# Patient Record
Sex: Male | Born: 1960 | Race: White | Hispanic: Yes | Marital: Married | Smoking: Never smoker
Health system: Southern US, Community
[De-identification: ages and names within clinical notes are randomized; demographics above are authoritative.]

## PROBLEM LIST (undated history)

## (undated) DIAGNOSIS — R519 Headache, unspecified: Secondary | ICD-10-CM

## (undated) DIAGNOSIS — T8859XA Other complications of anesthesia, initial encounter: Secondary | ICD-10-CM

## (undated) DIAGNOSIS — E119 Type 2 diabetes mellitus without complications: Secondary | ICD-10-CM

## (undated) DIAGNOSIS — I10 Essential (primary) hypertension: Secondary | ICD-10-CM

## (undated) DIAGNOSIS — IMO0001 Reserved for inherently not codable concepts without codable children: Secondary | ICD-10-CM

## (undated) DIAGNOSIS — E785 Hyperlipidemia, unspecified: Secondary | ICD-10-CM

## (undated) DIAGNOSIS — E11319 Type 2 diabetes mellitus with unspecified diabetic retinopathy without macular edema: Secondary | ICD-10-CM

## (undated) DIAGNOSIS — D649 Anemia, unspecified: Secondary | ICD-10-CM

## (undated) DIAGNOSIS — K08109 Complete loss of teeth, unspecified cause, unspecified class: Secondary | ICD-10-CM

## (undated) DIAGNOSIS — R51 Headache: Secondary | ICD-10-CM

## (undated) DIAGNOSIS — Z973 Presence of spectacles and contact lenses: Secondary | ICD-10-CM

## (undated) DIAGNOSIS — T4145XA Adverse effect of unspecified anesthetic, initial encounter: Secondary | ICD-10-CM

## (undated) DIAGNOSIS — N186 End stage renal disease: Secondary | ICD-10-CM

## (undated) DIAGNOSIS — Z972 Presence of dental prosthetic device (complete) (partial): Secondary | ICD-10-CM

## (undated) DIAGNOSIS — H5440 Blindness, one eye, unspecified eye: Secondary | ICD-10-CM

## (undated) HISTORY — DX: Essential (primary) hypertension: I10

## (undated) HISTORY — DX: Anemia, unspecified: D64.9

## (undated) HISTORY — PX: OTHER SURGICAL HISTORY: SHX169

## (undated) HISTORY — PX: EYE SURGERY: SHX253

## (undated) HISTORY — DX: Hyperlipidemia, unspecified: E78.5

## (undated) HISTORY — DX: Type 2 diabetes mellitus without complications: E11.9

## (undated) HISTORY — PX: COLONOSCOPY: SHX174

---

## 2009-12-29 ENCOUNTER — Inpatient Hospital Stay (HOSPITAL_COMMUNITY): Admission: EM | Admit: 2009-12-29 | Discharge: 2009-12-31 | Payer: Self-pay | Admitting: Emergency Medicine

## 2010-10-04 LAB — BASIC METABOLIC PANEL
BUN: 14 mg/dL (ref 6–23)
Calcium: 8.7 mg/dL (ref 8.4–10.5)
Creatinine, Ser: 1.02 mg/dL (ref 0.4–1.5)
Potassium: 4.4 mEq/L (ref 3.5–5.1)
Sodium: 142 mEq/L (ref 135–145)

## 2010-10-04 LAB — CBC
HCT: 35.7 % — ABNORMAL LOW (ref 39.0–52.0)
Hemoglobin: 12.1 g/dL — ABNORMAL LOW (ref 13.0–17.0)
MCV: 88 fL (ref 78.0–100.0)
Platelets: 296 10*3/uL (ref 150–400)
WBC: 6.1 10*3/uL (ref 4.0–10.5)

## 2010-10-04 LAB — GLUCOSE, CAPILLARY

## 2010-10-05 LAB — POCT I-STAT, CHEM 8
BUN: 23 mg/dL (ref 6–23)
Calcium, Ion: 1.09 mmol/L — ABNORMAL LOW (ref 1.12–1.32)
Glucose, Bld: 105 mg/dL — ABNORMAL HIGH (ref 70–99)
HCT: 34 % — ABNORMAL LOW (ref 39.0–52.0)
Potassium: 4.1 mEq/L (ref 3.5–5.1)
Sodium: 139 mEq/L (ref 135–145)
TCO2: 25 mmol/L (ref 0–100)

## 2010-10-05 LAB — DIFFERENTIAL
Basophils Absolute: 0 10*3/uL (ref 0.0–0.1)
Basophils Relative: 0 % (ref 0–1)
Eosinophils Absolute: 0.2 10*3/uL (ref 0.0–0.7)
Eosinophils Relative: 2 % (ref 0–5)
Lymphocytes Relative: 19 % (ref 12–46)
Lymphocytes Relative: 20 % (ref 12–46)
Lymphs Abs: 1.5 10*3/uL (ref 0.7–4.0)
Monocytes Relative: 6 % (ref 3–12)
Neutro Abs: 5.4 10*3/uL (ref 1.7–7.7)
Neutrophils Relative %: 72 % (ref 43–77)
Neutrophils Relative %: 74 % (ref 43–77)

## 2010-10-05 LAB — CBC
HCT: 34.7 % — ABNORMAL LOW (ref 39.0–52.0)
HCT: 34.8 % — ABNORMAL LOW (ref 39.0–52.0)
Hemoglobin: 12 g/dL — ABNORMAL LOW (ref 13.0–17.0)
MCHC: 34.5 g/dL (ref 30.0–36.0)
MCV: 87.5 fL (ref 78.0–100.0)
MCV: 88.6 fL (ref 78.0–100.0)
Platelets: 281 10*3/uL (ref 150–400)
RBC: 3.92 MIL/uL — ABNORMAL LOW (ref 4.22–5.81)
RDW: 13.3 % (ref 11.5–15.5)
WBC: 7.6 10*3/uL (ref 4.0–10.5)

## 2010-10-05 LAB — GLUCOSE, CAPILLARY
Glucose-Capillary: 120 mg/dL — ABNORMAL HIGH (ref 70–99)
Glucose-Capillary: 120 mg/dL — ABNORMAL HIGH (ref 70–99)
Glucose-Capillary: 143 mg/dL — ABNORMAL HIGH (ref 70–99)
Glucose-Capillary: 156 mg/dL — ABNORMAL HIGH (ref 70–99)

## 2010-10-05 LAB — LIPID PANEL
Cholesterol: 174 mg/dL (ref 0–200)
HDL: 42 mg/dL (ref >39)
LDL Cholesterol: 100 mg/dL — ABNORMAL HIGH (ref 0–99)
Triglycerides: 159 mg/dL — ABNORMAL HIGH (ref <150)
VLDL: 32 mg/dL (ref 0–40)

## 2010-10-05 LAB — PROTIME-INR: INR: 1.02 (ref 0.00–1.49)

## 2010-10-05 LAB — HEMOGLOBIN A1C: Hgb A1c MFr Bld: 8 % — ABNORMAL HIGH (ref <5.7)

## 2012-11-20 ENCOUNTER — Other Ambulatory Visit (INDEPENDENT_AMBULATORY_CARE_PROVIDER_SITE_OTHER): Payer: 59

## 2012-11-20 DIAGNOSIS — I10 Essential (primary) hypertension: Secondary | ICD-10-CM

## 2012-11-20 DIAGNOSIS — E785 Hyperlipidemia, unspecified: Secondary | ICD-10-CM

## 2012-11-20 DIAGNOSIS — E119 Type 2 diabetes mellitus without complications: Secondary | ICD-10-CM

## 2012-11-20 LAB — BASIC METABOLIC PANEL WITH GFR
BUN: 17 mg/dL (ref 6–23)
CO2: 24 mEq/L (ref 19–32)
Calcium: 8.1 mg/dL — ABNORMAL LOW (ref 8.4–10.5)
Chloride: 108 mEq/L (ref 96–112)
Creat: 1.39 mg/dL — ABNORMAL HIGH (ref 0.50–1.35)
GFR, Est African American: 67 mL/min
GFR, Est Non African American: 58 mL/min — ABNORMAL LOW
Glucose, Bld: 83 mg/dL (ref 70–99)
Potassium: 4.2 mEq/L (ref 3.5–5.3)
Sodium: 140 mEq/L (ref 135–145)

## 2012-11-20 LAB — HEPATIC FUNCTION PANEL
ALT: 28 U/L (ref 0–53)
AST: 26 U/L (ref 0–37)
Albumin: 3.2 g/dL — ABNORMAL LOW (ref 3.5–5.2)
Alkaline Phosphatase: 55 U/L (ref 39–117)
Bilirubin, Direct: 0.1 mg/dL (ref 0.0–0.3)
Indirect Bilirubin: 0.3 mg/dL (ref 0.0–0.9)
Total Bilirubin: 0.4 mg/dL (ref 0.3–1.2)
Total Protein: 5.7 g/dL — ABNORMAL LOW (ref 6.0–8.3)

## 2012-11-20 LAB — MICROALBUMIN, URINE: Microalb, Ur: 77.59 mg/dL — ABNORMAL HIGH (ref 0.00–1.89)

## 2012-11-20 LAB — POCT GLYCOSYLATED HEMOGLOBIN (HGB A1C): Hemoglobin A1C: 7.3

## 2012-11-20 LAB — POCT UA - MICROALBUMIN

## 2012-11-20 NOTE — Progress Notes (Signed)
Patient came in for labs only.

## 2012-11-22 LAB — NMR LIPOPROFILE WITH LIPIDS
Cholesterol, Total: 133 mg/dL (ref <200)
HDL Particle Number: 35.6 umol/L (ref >=30.5)
HDL Size: 9.1 nm — ABNORMAL LOW (ref >=9.2)
HDL-C: 46 mg/dL (ref >=40)
LDL (calc): 67 mg/dL (ref <100)
LDL Particle Number: 681 nmol/L (ref <1000)
LDL Size: 20.2 nm — ABNORMAL LOW (ref >20.5)
LP-IR Score: 46 — ABNORMAL HIGH (ref <=45)
Large HDL-P: 5.9 umol/L (ref >=4.8)
Large VLDL-P: 1.7 nmol/L (ref <=2.7)
Small LDL Particle Number: 399 nmol/L (ref <=527)
Triglycerides: 101 mg/dL (ref <150)
VLDL Size: 48.3 nm — ABNORMAL HIGH (ref <=46.6)

## 2012-11-22 NOTE — Progress Notes (Signed)
Quick Note:  Labs abnormal. He is supposed to have an appointment with me soon to establish for care. Please ad vise him that we will need to discuss his abnormal labs. DM , CKD, etc. ______

## 2012-11-24 ENCOUNTER — Encounter: Payer: Self-pay | Admitting: Family Medicine

## 2012-11-24 ENCOUNTER — Ambulatory Visit (INDEPENDENT_AMBULATORY_CARE_PROVIDER_SITE_OTHER): Payer: 59 | Admitting: Family Medicine

## 2012-11-24 VITALS — BP 169/82 | HR 64 | Temp 97.4°F | Ht 64.0 in | Wt 138.2 lb

## 2012-11-24 DIAGNOSIS — I10 Essential (primary) hypertension: Secondary | ICD-10-CM

## 2012-11-24 DIAGNOSIS — R1011 Right upper quadrant pain: Secondary | ICD-10-CM

## 2012-11-24 DIAGNOSIS — E119 Type 2 diabetes mellitus without complications: Secondary | ICD-10-CM

## 2012-11-24 DIAGNOSIS — E785 Hyperlipidemia, unspecified: Secondary | ICD-10-CM

## 2012-11-24 LAB — CBC WITH DIFFERENTIAL/PLATELET
Basophils Absolute: 0 10*3/uL (ref 0.0–0.1)
Basophils Relative: 1 % (ref 0–1)
Eosinophils Absolute: 0.3 10*3/uL (ref 0.0–0.7)
Eosinophils Relative: 4 % (ref 0–5)
HCT: 37 % — ABNORMAL LOW (ref 39.0–52.0)
Hemoglobin: 12.7 g/dL — ABNORMAL LOW (ref 13.0–17.0)
Lymphocytes Relative: 23 % (ref 12–46)
Lymphs Abs: 1.5 10*3/uL (ref 0.7–4.0)
MCH: 28.7 pg (ref 26.0–34.0)
MCHC: 34.3 g/dL (ref 30.0–36.0)
MCV: 83.7 fL (ref 78.0–100.0)
Monocytes Absolute: 0.3 10*3/uL (ref 0.1–1.0)
Monocytes Relative: 5 % (ref 3–12)
Neutro Abs: 4.3 10*3/uL (ref 1.7–7.7)
Neutrophils Relative %: 67 % (ref 43–77)
Platelets: 289 10*3/uL (ref 150–400)
RBC: 4.42 MIL/uL (ref 4.22–5.81)
RDW: 14.4 % (ref 11.5–15.5)
WBC: 6.4 10*3/uL (ref 4.0–10.5)

## 2012-11-24 LAB — LIPASE: Lipase: 19 U/L (ref 0–75)

## 2012-11-24 LAB — AMYLASE: Amylase: 65 U/L (ref 0–105)

## 2012-11-24 MED ORDER — AMLODIPINE BESYLATE 5 MG PO TABS: 5.0000 mg | ORAL_TABLET | Freq: Every day | ORAL | Status: DC

## 2012-11-24 MED ORDER — LINAGLIPTIN 5 MG PO TABS: 5.0000 mg | ORAL_TABLET | Freq: Every day | ORAL | Status: DC

## 2012-11-24 NOTE — Progress Notes (Signed)
Patient ID: Craig Fisher, male   DOB: [birth year], 52 y.o.   MRN: [id number] SUBJECTIVE: HPI:  Patient is here for follow up of Diabetes Mellitus./hypertension/hyperlipidemia. Used to attend [facility], then went to the free clinic because no insurance and job. Working and has insurance so he is back. Has been on meds.  Symptoms of DM:has had Nocturia ,admits toUrinary Frequency ,denies Blurred vision ,deniesDizziness,denies.Dysuria,deniesparesthesias, deniesextremity pain or ulcers.Marland Kitchendenieschest pain. .has hadan annual eye exam. do check the feet. doescheck CBGs. Average CBG:________.Marland Kitchen deniesto episodes of hypoglycemia. doeshave an emergency hypoglycemic plan. admits toCompliance with medications. deniesProblems with medications. Has had DM retinopathy Needs new Eye surgeon/retinal specialist. Has had RUQ abdominal pain with meals intermittently   PMH/PSH: reviewed/updated in Epic  SH/FH: reviewed/updated in Epic  Allergies: reviewed/updated in Epic  Medications: reviewed/updated in Epic  Immunizations: reviewed/updated in Epic  ROS: As above in the HPI. All other systems are stable or negative.  OBJECTIVE: APPEARANCE:  Patient in no acute distress.The patient appeared well nourished and normally developed. Acyanotic. Waist: VITAL SIGNS:BP 169/82  Pulse 64  Temp(Src) 97.4 F (36.3 C) (Oral)  Ht 5\' 4"  (1.626 m)  Wt 138 lb 3.2 oz (62.687 kg)  BMI 23.71 kg/m2 Hispanic male  SKIN: warm and  Dry without overt rashes, tattoos and scars  HEAD and Neck: without JVD, Head and scalp: normal Eyes:No scleral icterus. Fundi normal, eye movements normal.Wears glasses Ears: Auricle normal, canal normal, Tympanic membranes normal, insufflation normal. Nose: normal Throat: normal Neck & thyroid: normal  CHEST & LUNGS: Chest wall: normal Lungs: Clear  CVS: Reveals the PMI to be normally located. Regular rhythm, First and Second Heart sounds are normal,  absence of murmurs, rubs or  gallops. Peripheral vasculature: Radial pulses: normal Dorsal pedis pulses: normal Posterior pulses: normal  ABDOMEN:  Appearance: normal Benign,, no organomegaly, no masses, no Abdominal Aortic enlargement. No Guarding , no rebound. No Bruits. Bowel sounds: normal  RECTAL: N/A GU: N/A  EXTREMETIES: nonedematous. Both Femoral and Pedal pulses are normal.  MUSCULOSKELETAL:  Spine: normal Joints: intact  NEUROLOGIC: oriented to time,place and person; nonfocal. Strength is normal Sensory is normal Reflexes are normal Cranial Nerves are normal.  ASSESSMENT: DM (diabetes mellitus) - Plan: linagliptin (TRADJENTA) 5 MG TABS tablet, Ambulatory referral to Ophthalmology  HTN (hypertension) - Plan: amLODipine (NORVASC) 5 MG tablet  HLD (hyperlipidemia)  Abdominal pain, right upper quadrant - Plan: CBC with Differential, Amylase, Lipase, US Abdomen Limited RUQ, Helicobacter pylori abs-IgG+IgA, bld  H/o retinopathy  PLAN:     Dr Paula Libra Recommendations  Diet and Exercise discussed with patient.  For nutrition information, I recommend books:  1).Eat to Live by Dr Excell Seltzer. 2).Prevent and Reverse Heart Disease by Dr Karl Luke. 3) Dr Janene Harvey reversing Diabetes  Exercise recommendations are:  If unable to walk, then the patient can exercise in a chair 3 times a day. By flapping arms like a bird gently and raising legs outwards to the front.  If ambulatory, the patient can go for walks for 30 minutes 3 times a week. Then increase the intensity and duration as tolerated.  Goal is to try to attain exercise frequency to 5 times a week.  If applicable: Best to perform resistance exercises (machines or weights) 2 days a week and cardio type exercises 3 days per week.  RX for BD needles 32Gx 5/32inches  #100 RFx3.  Orders Placed This Encounter  Procedures  . US Abdomen Limited RUQ    Standing Status: Future  Number of Occurrences:      Standing  Expiration Date: 01/24/2014    Order Specific Question:  Reason for Exam (SYMPTOM  OR DIAGNOSIS REQUIRED)    Answer:  right upper Quad pain with meals, DNM, HLD    Order Specific Question:  Preferred imaging location?    Answer:  [facility]  . CBC with Differential  . Amylase  . Lipase  . Helicobacter pylori abs-IgG+IgA, bld  . Ambulatory referral to Ophthalmology    Referral Priority:  Routine    Referral Type:  Consultation    Referral Reason:  Specialty Services Required    Requested Specialty:  Ophthalmology    Number of Visits Requested:  1   Results for orders placed in visit on [id number]  BASIC METABOLIC PANEL WITH GFR      Result Value Range   Sodium 140  135 - 145 mEq/L   Potassium 4.2  3.5 - 5.3 mEq/L   Chloride 108  96 - 112 mEq/L   CO2 24  19 - 32 mEq/L   Glucose, Bld 83  70 - 99 mg/dL   BUN 17  6 - 23 mg/dL   Creat 1.39 (*) 0.50 - 1.35 mg/dL   Calcium 8.1 (*) 8.4 - 10.5 mg/dL   GFR, Est African American 67     GFR, Est Non African American 58 (*)   HEPATIC FUNCTION PANEL      Result Value Range   Total Bilirubin 0.4  0.3 - 1.2 mg/dL   Bilirubin, Direct 0.1  0.0 - 0.3 mg/dL   Indirect Bilirubin 0.3  0.0 - 0.9 mg/dL   Alkaline Phosphatase 55  39 - 117 U/L   AST 26  0 - 37 U/L   ALT 28  0 - 53 U/L   Total Protein 5.7 (*) 6.0 - 8.3 g/dL   Albumin 3.2 (*) 3.5 - 5.2 g/dL  NMR LIPOPROFILE WITH LIPIDS      Result Value Range   LDL Particle Number 681  <1000 nmol/L   LDL (calc) 67  <100 mg/dL   HDL-C 46  >=40 mg/dL   Triglycerides 101  <150 mg/dL   Cholesterol, Total 133  <200 mg/dL   HDL Particle Number 35.6  >=30.5 umol/L   Large HDL-P 5.9  >=4.8 umol/L   Large VLDL-P 1.7  <=2.7 nmol/L   Small LDL Particle Number 399  <=527 nmol/L   LDL Size 20.2 (*) >20.5 nm   HDL Size 9.1 (*) >=9.2 nm   VLDL Size 48.3 (*) <=46.6 nm   LP-IR Score 46 (*) <=45  MICROALBUMIN, URINE      Result Value Range   Microalb, Ur 77.59 (*) 0.00 - 1.89 mg/dL  POCT GLYCOSYLATED  HEMOGLOBIN (HGB A1C)      Result Value Range   Hemoglobin A1C 7.3%    POCT UA - MICROALBUMIN      Result Value Range   Microalbumin Ur, POC 50mg /l     Meds ordered this encounter  Medications  . CRESTOR 40 MG tablet    Sig:   . lisinopril (PRINIVIL,ZESTRIL) 20 MG tablet    Sig: Take 20 mg by mouth daily.  . insulin glargine (LANTUS) 100 UNIT/ML injection    Sig: Inject 25 Units into the skin at bedtime.  Marland Kitchen linagliptin (TRADJENTA) 5 MG TABS tablet    Sig: Take 1 tablet (5 mg total) by mouth daily.    Dispense:  30 tablet    Refill:  5  .  amLODipine (NORVASC) 5 MG tablet    Sig: Take 1 tablet (5 mg total) by mouth daily.    Dispense:  30 tablet    Refill:  3   Discussed risk of GB disease. Discussed end organ damage  Risk with DM.and other co-morbid disorders.  RTC 2 weeks.  Lache Dagher P. Jacelyn Grip, M.D.

## 2012-11-24 NOTE — Patient Instructions (Addendum)
Dr Paula Libra Recommendations  Diet and Exercise discussed with patient.  For nutrition information, I recommend books:  1).Eat to Live by Dr Excell Seltzer. 2).Prevent and Reverse Heart Disease by Dr Karl Luke. 3) Dr Janene Harvey reversing Diabetes  Exercise recommendations are:  If unable to walk, then the patient can exercise in a chair 3 times a day. By flapping arms like a bird gently and raising legs outwards to the front.  If ambulatory, the patient can go for walks for 30 minutes 3 times a week. Then increase the intensity and duration as tolerated.  Goal is to try to attain exercise frequency to 5 times a week.  If applicable: Best to perform resistance exercises (machines or weights) 2 days a week and cardio type exercises 3 days per week.  Diabetes and Exercise Regular exercise is important and can help:   Control blood glucose (sugar).  Decrease blood pressure.    Control blood lipids (cholesterol, triglycerides).  Improve overall health. BENEFITS FROM EXERCISE  Improved fitness.  Improved flexibility.  Improved endurance.  Increased bone density.  Weight control.  Increased muscle strength.  Decreased body fat.  Improvement of the body's use of insulin, a hormone.  Increased insulin sensitivity.  Reduction of insulin needs.  Reduced stress and tension.  Helps you feel better. People with diabetes who add exercise to their lifestyle gain additional benefits, including:  Weight loss.  Reduced appetite.  Improvement of the body's use of blood glucose.  Decreased risk factors for heart disease:  Lowering of cholesterol and triglycerides.  Raising the level of good cholesterol (high-density lipoproteins, HDL).  Lowering blood sugar.  Decreased blood pressure. TYPE 1 DIABETES AND EXERCISE  Exercise will usually lower your blood glucose.  If blood glucose is greater than 240 mg/dl, check urine ketones. If ketones  are present, do not exercise.  Location of the insulin injection sites may need to be adjusted with exercise. Avoid injecting insulin into areas of the body that will be exercised. For example, avoid injecting insulin into:  The arms when playing tennis.  The legs when jogging. For more information, discuss this with your caregiver.  Keep a record of:  Food intake.  Type and amount of exercise.  Expected peak times of insulin action.  Blood glucose levels. Do this before, during, and after exercise. Review your records with your caregiver. This will help you to develop guidelines for adjusting food intake and insulin amounts.  TYPE 2 DIABETES AND EXERCISE  Regular physical activity can help control blood glucose.  Exercise is important because it may:  Increase the body's sensitivity to insulin.  Improve blood glucose control.  Exercise reduces the risk of heart disease. It decreases serum cholesterol and triglycerides. It also lowers blood pressure.  Those who take insulin or oral hypoglycemic agents should watch for signs of hypoglycemia. These signs include dizziness, shaking, sweating, chills, and confusion.  Body water is lost during exercise. It must be replaced. This will help to avoid loss of body fluids (dehydration) or heat stroke. Be sure to talk to your caregiver before starting an exercise program to make sure it is safe for you. Remember, any activity is better than none.  Document Released: 09/25/2003 Document Revised: 09/27/2011 Document Reviewed: 01/09/2009 [facility] Patient Information 2013 [place].  Diabetes and Foot Care Diabetes may cause you to have a poor blood supply (circulation) to your legs and feet. Because of this, the skin may be thinner, break easier,  and heal more slowly. You also may have nerve damage in your legs and feet causing decreased feeling. You may not notice minor injuries to your feet that could lead to serious problems or  infections. Taking care of your feet is one of the most important things you can do for yourself.  HOME CARE INSTRUCTIONS  Do not go barefoot. Bare feet are easily injured.  Check your feet daily for blisters, cuts, and redness.  Wash your feet with warm water (not hot) and mild soap. Pat your feet and between your toes until completely dry.  Apply a moisturizing lotion that does not contain alcohol or petroleum jelly to the dry skin on your feet and to dry brittle toenails. Do not put it between your toes.  Trim your toenails straight across. Do not dig under them or around the cuticle.  Do not cut corns or calluses, or try to remove them with medicine.  Wear clean cotton socks or stockings every day. Make sure they are not too tight. Do not wear knee high stockings since they may decrease blood flow to your legs.  Wear leather shoes that fit properly and have enough cushioning. To break in new shoes, wear them just a few hours a day to avoid injuring your feet.  Wear shoes at all times, even in the house.  Do not cross your legs. This may decrease the blood flow to your feet.  If you find a minor scrape, cut, or break in the skin on your feet, keep it and the skin around it clean and dry. These areas may be cleansed with mild soap and water. Do not use peroxide, alcohol, iodine or Merthiolate.  When you remove an adhesive bandage, be sure not to harm the skin around it.  If you have a wound, look at it several times a day to make sure it is healing.  Do not use heating pads or hot water bottles. Burns can occur. If you have lost feeling in your feet or legs, you may not know it is happening until it is too late.  Report any cuts, sores or bruises to your caregiver. Do not wait! SEEK MEDICAL CARE IF:   You have an injury that is not healing or you notice redness, numbness, burning, or tingling.  Your feet always feel cold.  You have pain or cramps in your legs and feet. SEEK  IMMEDIATE MEDICAL CARE IF:   There is increasing redness, swelling, or increasing pain in the wound.  There is a red line that goes up your leg.  Pus is coming from a wound.  You develop an unexplained oral temperature above 102 F (38.9 C), or as your caregiver suggests.  You notice a bad smell coming from an ulcer or wound. MAKE SURE YOU:   Understand these instructions.  Will watch your condition.  Will get help right away if you are not doing well or get worse. Document Released: 07/02/2000 Document Revised: 09/27/2011 Document Reviewed: 01/08/2009 [facility] Patient Information 2013 [place].

## 2012-11-27 LAB — HELICOBACTER PYLORI ABS-IGG+IGA, BLD
H Pylori IgG: >8 {ISR} — ABNORMAL HIGH
HELICOBACTER PYLORI AB, IGA: 20.7 U/mL — ABNORMAL HIGH (ref <9.0)

## 2012-11-28 NOTE — Progress Notes (Signed)
Quick Note:  Labs abnormal.needs to check stools for H pylori antigen. H pylori antibody was positive ______

## 2012-11-29 ENCOUNTER — Encounter: Payer: Self-pay | Admitting: *Deleted

## 2012-12-01 ENCOUNTER — Ambulatory Visit (HOSPITAL_COMMUNITY)
Admission: RE | Admit: 2012-12-01 | Discharge: 2012-12-01 | Disposition: A | Discharge status: Home / Self Care | Payer: 59 | Source: Ambulatory Visit | Attending: Family Medicine | Admitting: Family Medicine

## 2012-12-01 DIAGNOSIS — K828 Other specified diseases of gallbladder: Secondary | ICD-10-CM | POA: Insufficient documentation

## 2012-12-01 DIAGNOSIS — R1011 Right upper quadrant pain: Secondary | ICD-10-CM | POA: Insufficient documentation

## 2012-12-02 ENCOUNTER — Other Ambulatory Visit: Payer: Self-pay | Admitting: Family Medicine

## 2012-12-02 DIAGNOSIS — R768 Other specified abnormal immunological findings in serum: Secondary | ICD-10-CM

## 2012-12-04 NOTE — Progress Notes (Signed)
Quick Note:  Call patient.  U/S GB is normal. If RUQ pain comes back will consider doing a HIDA scan No change in plan. ______

## 2012-12-07 ENCOUNTER — Encounter: Payer: Self-pay | Admitting: *Deleted

## 2012-12-08 ENCOUNTER — Ambulatory Visit (INDEPENDENT_AMBULATORY_CARE_PROVIDER_SITE_OTHER): Payer: 59 | Admitting: Family Medicine

## 2012-12-08 ENCOUNTER — Encounter: Payer: Self-pay | Admitting: Family Medicine

## 2012-12-08 VITALS — BP 125/69 | HR 76 | Temp 97.9°F | Ht 64.0 in | Wt 139.0 lb

## 2012-12-08 DIAGNOSIS — R768 Other specified abnormal immunological findings in serum: Secondary | ICD-10-CM

## 2012-12-08 DIAGNOSIS — E785 Hyperlipidemia, unspecified: Secondary | ICD-10-CM

## 2012-12-08 DIAGNOSIS — E119 Type 2 diabetes mellitus without complications: Secondary | ICD-10-CM

## 2012-12-08 DIAGNOSIS — R894 Abnormal immunological findings in specimens from other organs, systems and tissues: Secondary | ICD-10-CM

## 2012-12-08 DIAGNOSIS — I10 Essential (primary) hypertension: Secondary | ICD-10-CM

## 2012-12-08 NOTE — Progress Notes (Signed)
Patient ID: Craig Fisher, male   DOB: [birth year], 52 y.o.   MRN: [id number] SUBJECTIVE: HPI: Here for follow up  Patient had U/S which was normal. His GI symptoms has resolved for now and opted not to have a HIDA scan.  Also came to follow up on his BP. He is doing well.  Past Medical History  Diagnosis Date  . Diabetes mellitus without complication   . Hyperlipidemia   . Hypertension    History reviewed. No pertinent past surgical history. Current Outpatient Prescriptions on File Prior to Visit  Medication Sig Dispense Refill  . amLODipine (NORVASC) 5 MG tablet Take 1 tablet (5 mg total) by mouth daily.  30 tablet  3  . CRESTOR 40 MG tablet       . insulin glargine (LANTUS) 100 UNIT/ML injection Inject 25 Units into the skin at bedtime.      Marland Kitchen linagliptin (TRADJENTA) 5 MG TABS tablet Take 1 tablet (5 mg total) by mouth daily.  30 tablet  5  . lisinopril (PRINIVIL,ZESTRIL) 20 MG tablet Take 20 mg by mouth daily.       No current facility-administered medications on file prior to visit.   No Known Allergies Immunization History  Administered Date(s) Administered  . Tdap 07/19/2010   History   Social History  . Marital Status: Married    Spouse Name: N/A    Number of Children: N/A  . Years of Education: N/A   Occupational History  . Not on file.   Social History Main Topics  . Smoking status: Never Smoker   . Smokeless tobacco: Not on file  . Alcohol Use: Not on file  . Drug Use: Not on file  . Sexually Active: Not on file   Other Topics Concern  . Not on file   Social History Narrative  . No narrative on file     PMH/PSH: reviewed/updated in Epic  SH/FH: reviewed/updated in Epic  Allergies: reviewed/updated in Epic  Medications: reviewed/updated in Epic  Immunizations: reviewed/updated in Epic  ROS: As above in the HPI. All other systems are stable or negative.  OBJECTIVE: APPEARANCE:  Patient in no acute distress.The patient appeared well nourished  and normally developed. Acyanotic. Waist: VITAL SIGNS:BP 125/69  Pulse 76  Temp(Src) 97.9 F (36.6 C) (Oral)  Ht 5\' 4"  (1.626 m)  Wt 139 lb (63.05 kg)  BMI 23.85 kg/m2  Hispanic Male.  SKIN: warm and  Dry without overt rashes, tattoos and scars  HEAD and Neck: without JVD, Head and scalp: normal Eyes:No scleral icterus. Fundi normal, eye movements normal. Ears: Auricle normal, canal normal, Tympanic membranes normal, insufflation normal. Nose: normal Throat: normal Neck & thyroid: normal  CHEST & LUNGS: Chest wall: normal Lungs: Clear  CVS: Reveals the PMI to be normally located. Regular rhythm, First and Second Heart sounds are normal,  absence of murmurs, rubs or gallops. Peripheral vasculature: Radial pulses: normal Dorsal pedis pulses: normal Posterior pulses: normal  ABDOMEN:  Appearance: normal Benign,, no organomegaly, no masses, no Abdominal Aortic enlargement. No Guarding , no rebound. No Bruits. Bowel sounds: normal  RECTAL: N/A GU: N/A  EXTREMETIES: nonedematous. Both Femoral and Pedal pulses are normal.  MUSCULOSKELETAL:  Spine: normal Joints: intact  NEUROLOGIC: oriented to time,place and person; nonfocal. Strength is normal Sensory is normal Reflexes are normal Cranial Nerves are normal.  ASSESSMENT: BP controlled now. HTN (hypertension)  HLD (hyperlipidemia)  DM (diabetes mellitus)  Helicobacter pylori antibody positive   PLAN: Patient to collect stools and  bring in to test for H Pylori Ag. Reviewed labs with patient  No orders of the defined types were placed in this encounter.   Results for orders placed in visit on 11/24/12  CBC WITH DIFFERENTIAL      Result Value Range   WBC 6.4  4.0 - 10.5 K/uL   RBC 4.42  4.22 - 5.81 MIL/uL   Hemoglobin 12.7 (*) 13.0 - 17.0 g/dL   HCT 37.0 (*) 39.0 - 52.0 %   MCV 83.7  78.0 - 100.0 fL   MCH 28.7  26.0 - 34.0 pg   MCHC 34.3  30.0 - 36.0 g/dL   RDW 14.4  11.5 - 15.5 %   Platelets  289  150 - 400 K/uL   Neutrophils Relative % 67  43 - 77 %   Neutro Abs 4.3  1.7 - 7.7 K/uL   Lymphocytes Relative 23  12 - 46 %   Lymphs Abs 1.5  0.7 - 4.0 K/uL   Monocytes Relative 5  3 - 12 %   Monocytes Absolute 0.3  0.1 - 1.0 K/uL   Eosinophils Relative 4  0 - 5 %   Eosinophils Absolute 0.3  0.0 - 0.7 K/uL   Basophils Relative 1  0 - 1 %   Basophils Absolute 0.0  0.0 - 0.1 K/uL   Smear Review Criteria for review not met    AMYLASE      Result Value Range   Amylase 65  0 - 105 U/L  LIPASE      Result Value Range   Lipase 19  0 - 75 U/L  HELICOBACTER PYLORI ABS-IGG+IGA, BLD      Result Value Range   H Pylori IgG [id number] (*)    HELICOBACTER PYLORI AB, IGA 20.7 (*) <9.0 U/mL   No orders of the defined types were placed in this encounter.   RTc 3 months.  Caelen Higinbotham P. Jacelyn Grip, M.D.

## 2012-12-09 DIAGNOSIS — E785 Hyperlipidemia, unspecified: Secondary | ICD-10-CM | POA: Insufficient documentation

## 2012-12-09 DIAGNOSIS — I10 Essential (primary) hypertension: Secondary | ICD-10-CM | POA: Insufficient documentation

## 2012-12-09 DIAGNOSIS — R768 Other specified abnormal immunological findings in serum: Secondary | ICD-10-CM | POA: Insufficient documentation

## 2012-12-09 DIAGNOSIS — R7689 Other specified abnormal immunological findings in serum: Secondary | ICD-10-CM | POA: Insufficient documentation

## 2013-03-13 ENCOUNTER — Ambulatory Visit: Payer: 59 | Admitting: Family Medicine

## 2013-03-30 ENCOUNTER — Encounter: Payer: Self-pay | Admitting: Family Medicine

## 2013-03-30 ENCOUNTER — Ambulatory Visit (INDEPENDENT_AMBULATORY_CARE_PROVIDER_SITE_OTHER): Payer: 59 | Admitting: Family Medicine

## 2013-03-30 VITALS — BP 145/79 | HR 75 | Temp 97.2°F | Ht 64.75 in | Wt 141.8 lb

## 2013-03-30 DIAGNOSIS — E11319 Type 2 diabetes mellitus with unspecified diabetic retinopathy without macular edema: Secondary | ICD-10-CM | POA: Insufficient documentation

## 2013-03-30 DIAGNOSIS — I10 Essential (primary) hypertension: Secondary | ICD-10-CM

## 2013-03-30 DIAGNOSIS — E119 Type 2 diabetes mellitus without complications: Secondary | ICD-10-CM

## 2013-03-30 DIAGNOSIS — E785 Hyperlipidemia, unspecified: Secondary | ICD-10-CM

## 2013-03-30 DIAGNOSIS — E1139 Type 2 diabetes mellitus with other diabetic ophthalmic complication: Secondary | ICD-10-CM

## 2013-03-30 LAB — POCT GLYCOSYLATED HEMOGLOBIN (HGB A1C): Hemoglobin A1C: 7.7

## 2013-03-30 MED ORDER — LISINOPRIL 20 MG PO TABS: 20.0000 mg | ORAL_TABLET | Freq: Every day | ORAL | Status: DC

## 2013-03-30 MED ORDER — ROSUVASTATIN CALCIUM 40 MG PO TABS: 40.0000 mg | ORAL_TABLET | Freq: Every day | ORAL | Status: DC

## 2013-03-30 NOTE — Progress Notes (Signed)
Patient ID: Craig Fisher, male   DOB: Jan 04, 1961, 52 y.o.   MRN: [id number] SUBJECTIVE: CC: Chief Complaint  Patient presents with  . Follow-up    3 month follow  up. not taking lisinopril  needs needles for lantus pen     HPI: Had eye surgery recently with Dr Craig Fisher. DM retinopathy  Patient is here for follow up of Diabetes Mellitus/HTN/HLD: Symptoms evaluated: Denies Nocturia ,Denies Urinary Frequency , denies Blurred vision ,deniesDizziness,denies.Dysuria,denies paresthesias, denies extremity pain or ulcers.Marland Kitchendenies chest pain. has had an annual eye exam. do check the feet. Does check CBGs. Average CBG:not quite at goal. Denies episodes of hypoglycemia. Does have an emergency hypoglycemic plan. admits toCompliance with medications. Denies Problems with medications.   Past Medical History  Diagnosis Date  . Diabetes mellitus without complication   . Hyperlipidemia   . Hypertension    No past surgical history on file. History   Social History  . Marital Status: Married    Spouse Name: N/A    Number of Children: N/A  . Years of Education: N/A   Occupational History  . Not on file.   Social History Main Topics  . Smoking status: Never Smoker   . Smokeless tobacco: Not on file  . Alcohol Use: Not on file  . Drug Use: Not on file  . Sexual Activity: Not on file   Other Topics Concern  . Not on file   Social History Narrative  . No narrative on file   No family history on file. Current Outpatient Prescriptions on File Prior to Visit  Medication Sig Dispense Refill  . amLODipine (NORVASC) 5 MG tablet Take 1 tablet (5 mg total) by mouth daily.  30 tablet  3  . insulin glargine (LANTUS) 100 UNIT/ML injection Inject 25 Units into the skin at bedtime.      Marland Kitchen linagliptin (TRADJENTA) 5 MG TABS tablet Take 1 tablet (5 mg total) by mouth daily.  30 tablet  5  . lisinopril (PRINIVIL,ZESTRIL) 20 MG tablet Take 20 mg by mouth daily.       No current facility-administered  medications on file prior to visit.   No Known Allergies Immunization History  Administered Date(s) Administered  . Tdap 07/19/2010   Prior to Admission medications   Medication Sig Start Date End Date Taking? Authorizing Provider  amLODipine (NORVASC) 5 MG tablet Take 1 tablet (5 mg total) by mouth daily. 11/24/12  Yes Craig Shanks, MD  insulin glargine (LANTUS) 100 UNIT/ML injection Inject 25 Units into the skin at bedtime.   Yes Historical Provider, MD  linagliptin (TRADJENTA) 5 MG TABS tablet Take 1 tablet (5 mg total) by mouth daily. 11/24/12  Yes Craig Shanks, MD  timolol (TIMOPTIC) 0.5 % ophthalmic solution  02/15/13  Yes Historical Provider, MD  lisinopril (PRINIVIL,ZESTRIL) 20 MG tablet Take 20 mg by mouth daily.    Historical Provider, MD     ROS: As above in the HPI. All other systems are stable or negative.  OBJECTIVE: APPEARANCE:  Patient in no acute distress.The patient appeared well nourished and normally developed. Acyanotic. Waist: VITAL SIGNS:BP 145/79  Pulse 75  Temp(Src) 97.2 F (36.2 C) (Oral)  Ht 5' 4.75" (1.645 m)  Wt 141 lb 12.8 oz (64.32 kg)  BMI 23.77 kg/m2 hispanic Male  SKIN: warm and  Dry without overt rashes, tattoos and scars  HEAD and Neck: without JVD, Head and scalp: normal Eyes:No scleral icterus. eye movements normal. Ears: Auricle normal, canal normal, Tympanic membranes normal,  insufflation normal. Nose: normal Throat: normal Neck & thyroid: normal  CHEST & LUNGS: Chest wall: normal Lungs: Clear  CVS: Reveals the PMI to be normally located. Regular rhythm, First and Second Heart sounds are normal,  absence of murmurs, rubs or gallops. Peripheral vasculature: Radial pulses: normal Dorsal pedis pulses: normal Posterior pulses: normal  ABDOMEN:  Appearance: normal Benign, no organomegaly, no masses, no Abdominal Aortic enlargement. No Guarding , no rebound. No Bruits. Bowel sounds: normal  RECTAL: N/A GU:  N/A  EXTREMETIES: nonedematous.  MUSCULOSKELETAL:  Spine: normal Joints: intact  NEUROLOGIC: oriented to time,place and person; nonfocal. Strength is normal Sensory is normal Reflexes are normal Cranial Nerves are normal.  ASSESSMENT: DM (diabetes mellitus) - Plan: POCT glycosylated hemoglobin (Hb A1C), CMP14+EGFR, NMR, lipoprofile  HTN (hypertension) - Plan: lisinopril (PRINIVIL,ZESTRIL) 20 MG tablet  HLD (hyperlipidemia) - Plan: rosuvastatin (CRESTOR) 40 MG tablet  Diabetic retinopathy  PLAN: Orders Placed This Encounter  Procedures  . CMP14+EGFR  . NMR, lipoprofile  . POCT glycosylated hemoglobin (Hb A1C)        Dr Craig Fisher Recommendations  For nutrition information, I recommend books:  1).Eat to Live by Dr Craig Fisher. 2).Prevent and Reverse Heart Disease by Dr Craig Fisher. 3) Dr Craig Fisher Book:  Program to Reverse Diabetes  Exercise recommendations are:  If unable to walk, then the patient can exercise in a chair 3 times a day. By flapping arms like a bird gently and raising legs outwards to the front.  If ambulatory, the patient can go for walks for 30 minutes 3 times a week. Then increase the intensity and duration as tolerated.  Goal is to try to attain exercise frequency to 5 times a week.  If applicable: Best to perform resistance exercises (machines or weights) 2 days a week and cardio type exercises 3 days per week.  Discuss foot care. Discuss the risk for blindness.  Meds ordered this encounter  Medications  . timolol (TIMOPTIC) 0.5 % ophthalmic solution    Sig:   . lisinopril (PRINIVIL,ZESTRIL) 20 MG tablet    Sig: Take 1 tablet (20 mg total) by mouth daily.    Dispense:  30 tablet    Refill:  5  . rosuvastatin (CRESTOR) 40 MG tablet    Sig: Take 1 tablet (40 mg total) by mouth daily.    Dispense:  30 tablet    Refill:  5   Medications Discontinued During This Encounter  Medication Reason  . CRESTOR 40 MG tablet Change  in therapy  . lisinopril (PRINIVIL,ZESTRIL) 20 MG tablet Reorder    Aggressive diet and exercise.  Await labs.  Return in about 6 weeks (around 05/11/2013) for Recheck medical problems.  Craig Fisher P. Jacelyn Grip, M.D.

## 2013-03-30 NOTE — Patient Instructions (Addendum)
Dr Paula Libra Recommendations  For nutrition information, I recommend books:  1).Eat to Live by Dr Excell Seltzer. 2).Prevent and Reverse Heart Disease by Dr Karl Luke. 3) Dr Janene Harvey Book:  Program to Reverse Diabetes  Exercise recommendations are:  If unable to walk, then the patient can exercise in a chair 3 times a day. By flapping arms like a bird gently and raising legs outwards to the front.  If ambulatory, the patient can go for walks for 30 minutes 3 times a week. Then increase the intensity and duration as tolerated.  Goal is to try to attain exercise frequency to 5 times a week.  If applicable: Best to perform resistance exercises (machines or weights) 2 days a week and cardio type exercises 3 days per week.   Diabetes and Foot Care Diabetes may cause you to have a poor blood supply (circulation) to your legs and feet. Because of this, the skin may be thinner, break easier, and heal more slowly. You also may have nerve damage in your legs and feet causing decreased feeling. You may not notice minor injuries to your feet that could lead to serious problems or infections. Taking care of your feet is one of the most important things you can do for yourself.  HOME CARE INSTRUCTIONS  Do not go barefoot. Bare feet are easily injured.  Check your feet daily for blisters, cuts, and redness.  Wash your feet with warm water (not hot) and mild soap. Pat your feet and between your toes until completely dry.  Apply a moisturizing lotion that does not contain alcohol or petroleum jelly to the dry skin on your feet and to dry brittle toenails. Do not put it between your toes.  Trim your toenails straight across. Do not dig under them or around the cuticle.  Do not cut corns or calluses, or try to remove them with medicine.  Wear clean cotton socks or stockings every day. Make sure they are not too tight. Do not wear knee high stockings since they may decrease  blood flow to your legs.  Wear leather shoes that fit properly and have enough cushioning. To break in new shoes, wear them just a few hours a day to avoid injuring your feet.  Wear shoes at all times, even in the house.  Do not cross your legs. This may decrease the blood flow to your feet.  If you find a minor scrape, cut, or break in the skin on your feet, keep it and the skin around it clean and dry. These areas may be cleansed with mild soap and water. Do not use peroxide, alcohol, iodine or Merthiolate.  When you remove an adhesive bandage, be sure not to harm the skin around it.  If you have a wound, look at it several times a day to make sure it is healing.  Do not use heating pads or hot water bottles. Burns can occur. If you have lost feeling in your feet or legs, you may not know it is happening until it is too late.  Report any cuts, sores or bruises to your caregiver. Do not wait! SEEK MEDICAL CARE IF:   You have an injury that is not healing or you notice redness, numbness, burning, or tingling.  Your feet always feel cold.  You have pain or cramps in your legs and feet. SEEK IMMEDIATE MEDICAL CARE IF:   There is increasing redness, swelling, or increasing pain in the wound.  There is a red line that goes up your leg.  Pus is coming from a wound.  You develop an unexplained oral temperature above 102 F (38.9 C), or as your caregiver suggests.  You notice a bad smell coming from an ulcer or wound. MAKE SURE YOU:   Understand these instructions.  Will watch your condition.  Will get help right away if you are not doing well or get worse. Document Released: 07/02/2000 Document Revised: 09/27/2011 Document Reviewed: 01/08/2009 [facility] Patient Information 2014 [place], [place].

## 2013-04-01 LAB — CMP14+EGFR
ALT: 20 IU/L (ref 0–44)
AST: 25 IU/L (ref 0–40)
Albumin/Globulin Ratio: 1.4 (ref 1.1–2.5)
Albumin: 3.3 g/dL — ABNORMAL LOW (ref 3.5–5.5)
Alkaline Phosphatase: 63 IU/L (ref 39–117)
BUN/Creatinine Ratio: 19 (ref 9–20)
BUN: 25 mg/dL — ABNORMAL HIGH (ref 6–24)
CO2: 23 mmol/L (ref 18–29)
Calcium: 8.2 mg/dL — ABNORMAL LOW (ref 8.7–10.2)
Chloride: 106 mmol/L (ref 97–108)
Creatinine, Ser: 1.35 mg/dL — ABNORMAL HIGH (ref 0.76–1.27)
GFR calc Af Amer: 70 mL/min/{1.73_m2} (ref >59)
GFR calc non Af Amer: 60 mL/min/{1.73_m2} (ref >59)
Globulin, Total: 2.4 g/dL (ref 1.5–4.5)
Glucose: 116 mg/dL — ABNORMAL HIGH (ref 65–99)
Potassium: 4.1 mmol/L (ref 3.5–5.2)
Sodium: 140 mmol/L (ref 134–144)
Total Bilirubin: 0.2 mg/dL (ref 0.0–1.2)
Total Protein: 5.7 g/dL — ABNORMAL LOW (ref 6.0–8.5)

## 2013-04-01 LAB — NMR, LIPOPROFILE
Cholesterol: 218 mg/dL — ABNORMAL HIGH (ref <200)
HDL Cholesterol by NMR: 46 mg/dL (ref >=40)
HDL Particle Number: 37.4 umol/L (ref >=30.5)
LDL Particle Number: 2055 nmol/L — ABNORMAL HIGH (ref <1000)
LDL Size: 20.2 nm — ABNORMAL LOW (ref >20.5)
LDLC SERPL CALC-MCNC: 109 mg/dL — ABNORMAL HIGH (ref <100)
LP-IR Score: 65 — ABNORMAL HIGH (ref <=45)
Small LDL Particle Number: 1323 nmol/L — ABNORMAL HIGH (ref <=527)
Triglycerides by NMR: 315 mg/dL — ABNORMAL HIGH (ref <150)

## 2013-05-15 ENCOUNTER — Ambulatory Visit: Payer: 59 | Admitting: Family Medicine

## 2013-05-24 ENCOUNTER — Other Ambulatory Visit: Payer: Self-pay

## 2013-06-29 ENCOUNTER — Encounter: Payer: Self-pay | Admitting: Family Medicine

## 2013-06-29 ENCOUNTER — Ambulatory Visit (INDEPENDENT_AMBULATORY_CARE_PROVIDER_SITE_OTHER): Payer: 59 | Admitting: Family Medicine

## 2013-06-29 VITALS — BP 128/77 | HR 68 | Temp 99.1°F | Ht 64.75 in | Wt 139.0 lb

## 2013-06-29 DIAGNOSIS — Z23 Encounter for immunization: Secondary | ICD-10-CM

## 2013-06-29 DIAGNOSIS — R768 Other specified abnormal immunological findings in serum: Secondary | ICD-10-CM

## 2013-06-29 DIAGNOSIS — E785 Hyperlipidemia, unspecified: Secondary | ICD-10-CM

## 2013-06-29 DIAGNOSIS — E11319 Type 2 diabetes mellitus with unspecified diabetic retinopathy without macular edema: Secondary | ICD-10-CM

## 2013-06-29 DIAGNOSIS — Z0000 Encounter for general adult medical examination without abnormal findings: Secondary | ICD-10-CM | POA: Insufficient documentation

## 2013-06-29 DIAGNOSIS — E119 Type 2 diabetes mellitus without complications: Secondary | ICD-10-CM

## 2013-06-29 DIAGNOSIS — R894 Abnormal immunological findings in specimens from other organs, systems and tissues: Secondary | ICD-10-CM

## 2013-06-29 DIAGNOSIS — E1139 Type 2 diabetes mellitus with other diabetic ophthalmic complication: Secondary | ICD-10-CM

## 2013-06-29 DIAGNOSIS — I10 Essential (primary) hypertension: Secondary | ICD-10-CM

## 2013-06-29 DIAGNOSIS — Z1211 Encounter for screening for malignant neoplasm of colon: Secondary | ICD-10-CM | POA: Insufficient documentation

## 2013-06-29 LAB — POCT GLYCOSYLATED HEMOGLOBIN (HGB A1C): Hemoglobin A1C: 6.9

## 2013-06-29 NOTE — Patient Instructions (Addendum)
Pneumococcal Vaccine, Polyvalent suspension for injection What is this medicine? PNEUMOCOCCAL VACCINE, POLYVALENT (NEU mo KOK al vak SEEN, pol ee VEY luhnt) is a vaccine to prevent pneumococcus bacteria infection. These bacteria are a major cause of ear infections, 'Strep throat' infections, and serious pneumonia, meningitis, or blood infections worldwide. These vaccines help the body to produce antibodies (protective substances) that help your body defend against these bacteria. This vaccine is recommended for infants and young children. This vaccine will not treat an infection. This medicine may be used for other purposes; ask your health care provider or pharmacist if you have questions. COMMON BRAND NAME(S): Prevnar 13 , Prevnar What should I tell my health care provider before I take this medicine? They need to know if you have any of these conditions: -bleeding problems -fever -immune system problems -low platelet count in the blood -seizures -an unusual or allergic reaction to pneumococcal vaccine, diphtheria toxoid, other vaccines, latex, other medicines, foods, dyes, or preservatives -pregnant or trying to get pregnant -breast-feeding How should I use this medicine? This vaccine is for injection into a muscle. It is given by a health care professional. A copy of Vaccine Information Statements will be given before each vaccination. Read this sheet carefully each time. The sheet may change frequently. Talk to your pediatrician regarding the use of this medicine in children. While this drug may be prescribed for children as young as 23 weeks old for selected conditions, precautions do apply. Overdosage: If you think you have taken too much of this medicine contact a poison control center or emergency room at once. NOTE: This medicine is only for you. Do not share this medicine with others. What if I miss a dose? It is important not to miss your dose. Call your doctor or health care  professional if you are unable to keep an appointment. What may interact with this medicine? -medicines for cancer chemotherapy -medicines that suppress your immune function -medicines that treat or prevent blood clots like warfarin, enoxaparin, and dalteparin -steroid medicines like prednisone or cortisone This list may not describe all possible interactions. Give your health care provider a list of all the medicines, herbs, non-prescription drugs, or dietary supplements you use. Also tell them if you smoke, drink alcohol, or use illegal drugs. Some items may interact with your medicine. What should I watch for while using this medicine? Mild fever and pain should go away in 3 days or less. Report any unusual symptoms to your doctor or health care professional. What side effects may I notice from receiving this medicine? Side effects that you should report to your doctor or health care professional as soon as possible: -allergic reactions like skin rash, itching or hives, swelling of the face, lips, or tongue -breathing problems -confused -fever over 102 degrees F -pain, tingling, numbness in the hands or feet -seizures -unusual bleeding or bruising -unusual muscle weakness Side effects that usually do not require medical attention (report to your doctor or health care professional if they continue or are bothersome): -aches and pains -diarrhea -fever of 102 degrees F or less -headache -irritable -loss of appetite -pain, tender at site where injected -trouble sleeping This list may not describe all possible side effects. Call your doctor for medical advice about side effects. You may report side effects to FDA at 1-800-FDA-1088. Where should I keep my medicine? This does not apply. This vaccine is given in a clinic, pharmacy, doctor's office, or other health care setting and will not be stored at  home. NOTE: This sheet is a summary. It may not cover all possible information. If you  have questions about this medicine, talk to your doctor, pharmacist, or health care provider.  2014, Elsevier/Gold Standard. (2008-09-17 10:17:22)        Dr Paula Libra Recommendations  For nutrition information, I recommend books:  1).Eat to Live by Dr Excell Seltzer. 2).Prevent and Reverse Heart Disease by Dr Karl Luke. 3) Dr Janene Harvey Book:  Program to Reverse Diabetes  Exercise recommendations are:  If unable to walk, then the patient can exercise in a chair 3 times a day. By flapping arms like a bird gently and raising legs outwards to the front.  If ambulatory, the patient can go for walks for 30 minutes 3 times a week. Then increase the intensity and duration as tolerated.  Goal is to try to attain exercise frequency to 5 times a week.  If applicable: Best to perform resistance exercises (machines or weights) 2 days a week and cardio type exercises 3 days per week.

## 2013-06-29 NOTE — Progress Notes (Signed)
Patient ID: Craig Fisher, male   DOB: 11-21-1960, 52 y.o.   MRN: [id number] SUBJECTIVE: CC: Chief Complaint  Patient presents with  . Follow-up    chronic medical conditions. Patient has not seen the pharmacist for hyperlipidemia and diabetes control. He is due for a colonoscopy as well.     HPI:  Patient is here for follow up of Diabetes Mellitus with neuropathy/HTN/HLD: Symptoms evaluated: Denies Nocturia ,Denies Urinary Frequency , denies Blurred vision ,deniesDizziness,denies.Dysuria,does have unchanged  Paresthesias of feet, denies extremity pain or ulcers.Marland Kitchendenies chest pain. has had an annual eye exam. do check the feet. Does check CBGs. Average CBG:130s Denies episodes of hypoglycemia. Does have an emergency hypoglycemic plan. admits toCompliance with medications. Denies Problems with medications.   His daughter received the message at home and he misunderstood the instructions and did not make an appointment with Lynelle Smoke or Sharyn Lull for DM education.  On the same medications and same diet and no change in exercise/ work levels.  Past Medical History  Diagnosis Date  . Diabetes mellitus without complication   . Hyperlipidemia   . Hypertension    History reviewed. No pertinent past surgical history. History   Social History  . Marital Status: Married    Spouse Name: N/A    Number of Children: N/A  . Years of Education: N/A   Occupational History  . Not on file.   Social History Main Topics  . Smoking status: Never Smoker   . Smokeless tobacco: Never Used  . Alcohol Use: No  . Drug Use: No  . Sexual Activity: Not on file   Other Topics Concern  . Not on file   Social History Narrative  . No narrative on file   Family History  Problem Relation Age of Onset  . Diabetes Mother   . Diabetes Sister    Current Outpatient Prescriptions on File Prior to Visit  Medication Sig Dispense Refill  . amLODipine (NORVASC) 5 MG tablet Take 1 tablet (5 mg total) by mouth  daily.  30 tablet  3  . insulin glargine (LANTUS) 100 UNIT/ML injection Inject 25 Units into the skin at bedtime.      Marland Kitchen linagliptin (TRADJENTA) 5 MG TABS tablet Take 1 tablet (5 mg total) by mouth daily.  30 tablet  5  . lisinopril (PRINIVIL,ZESTRIL) 20 MG tablet Take 1 tablet (20 mg total) by mouth daily.  30 tablet  5  . rosuvastatin (CRESTOR) 40 MG tablet Take 1 tablet (40 mg total) by mouth daily.  30 tablet  5  . timolol (TIMOPTIC) 0.5 % ophthalmic solution        No current facility-administered medications on file prior to visit.   No Known Allergies Immunization History  Administered Date(s) Administered  . Pneumococcal Conjugate-13 06/29/2013  . Tdap 07/19/2010   Prior to Admission medications   Medication Sig Start Date End Date Taking? Authorizing Provider  amLODipine (NORVASC) 5 MG tablet Take 1 tablet (5 mg total) by mouth daily. 11/24/12  Yes Vernie Shanks, MD  insulin glargine (LANTUS) 100 UNIT/ML injection Inject 25 Units into the skin at bedtime.   Yes Historical Provider, MD  linagliptin (TRADJENTA) 5 MG TABS tablet Take 1 tablet (5 mg total) by mouth daily. 11/24/12  Yes Vernie Shanks, MD  lisinopril (PRINIVIL,ZESTRIL) 20 MG tablet Take 1 tablet (20 mg total) by mouth daily. 03/30/13  Yes Vernie Shanks, MD  rosuvastatin (CRESTOR) 40 MG tablet Take 1 tablet (40 mg total) by mouth daily. 03/30/13  Yes Vernie Shanks, MD  timolol (TIMOPTIC) 0.5 % ophthalmic solution  02/15/13  Yes Historical Provider, MD     ROS: As above in the HPI. All other systems are stable or negative.  OBJECTIVE: APPEARANCE:  Patient in no acute distress.The patient appeared well nourished and normally developed. Acyanotic. Waist: VITAL SIGNS:BP 128/77  Pulse 68  Temp(Src) 99.1 F (37.3 C) (Oral)  Ht 5' 4.75" (1.645 m)  Wt 139 lb (63.05 kg)  BMI 23.30 kg/m2  Hispanic Male SKIN: warm and  Dry without overt rashes, tattoos and scars  HEAD and Neck: without JVD, Head and scalp:  normal Eyes:No scleral icterus. Fundi normal, eye movements normal. Ears: Auricle normal, canal normal, Tympanic membranes normal, insufflation normal. Nose: normal Throat: normal Neck & thyroid: normal  CHEST & LUNGS: Chest wall: normal Lungs: Clear  CVS: Reveals the PMI to be normally located. Regular rhythm, First and Second Heart sounds are normal,  absence of murmurs, rubs or gallops. Peripheral vasculature: Radial pulses: normal Dorsal pedis pulses: normal Posterior pulses: normal  ABDOMEN:  Appearance: normal Benign, no organomegaly, no masses, no Abdominal Aortic enlargement. No Guarding , no rebound. No Bruits. Bowel sounds: normal  RECTAL: N/A GU: N/A  EXTREMETIES: nonedematous.  MUSCULOSKELETAL:  Spine: normal Joints: intact  NEUROLOGIC: oriented to time,place and person; nonfocal. Strength is normal Sensory is abnormal see the DM foot exam.  Reflexes are normal Cranial Nerves are normal.  ASSESSMENT: Need for prophylactic vaccination against Streptococcus pneumoniae (pneumococcus) - Plan: Pneumococcal conjugate vaccine 13-valent  Health care maintenance - Plan: Ambulatory referral to Gastroenterology  Diabetic retinopathy  HTN (hypertension) - Plan: CMP14+EGFR, NMR, lipoprofile  DM (diabetes mellitus) - Plan: POCT glycosylated hemoglobin (Hb A1C), CMP14+EGFR, CANCELED: POCT UA - Microalbumin  Helicobacter pylori antibody positive  HLD (hyperlipidemia) - Plan: CMP14+EGFR  Screen for colon cancer - Plan: Ambulatory referral to Gastroenterology  PLAN: Reviewed his previous labs with him.       Dr Paula Libra Recommendations  For nutrition information, I recommend books:  1).Eat to Live by Dr Excell Seltzer. 2).Prevent and Reverse Heart Disease by Dr Karl Luke. 3) Dr Janene Harvey Book:  Program to Reverse Diabetes  Exercise recommendations are:  If unable to walk, then the patient can exercise in a chair 3 times a day. By  flapping arms like a bird gently and raising legs outwards to the front.  If ambulatory, the patient can go for walks for 30 minutes 3 times a week. Then increase the intensity and duration as tolerated.  Goal is to try to attain exercise frequency to 5 times a week.  If applicable: Best to perform resistance exercises (machines or weights) 2 days a week and cardio type exercises 3 days per week.  Orders Placed This Encounter  Procedures  . Pneumococcal conjugate vaccine 13-valent  . CMP14+EGFR  . NMR, lipoprofile  . Ambulatory referral to Gastroenterology    Referral Priority:  Routine    Referral Type:  Consultation    Referral Reason:  Specialty Services Required    Requested Specialty:  Gastroenterology    Number of Visits Requested:  1  . Ambulatory referral to Gastroenterology    Referral Priority:  Routine    Referral Type:  Consultation    Referral Reason:  Specialty Services Required    Requested Specialty:  Gastroenterology    Number of Visits Requested:  1  . POCT glycosylated hemoglobin (Hb A1C)   No orders of the defined types were placed in this  encounter.   There are no discontinued medications. Return in about 3 months (around 09/27/2013) for Recheck medical problems.  Briasia Flinders P. Jacelyn Grip, M.D.

## 2013-07-01 LAB — NMR, LIPOPROFILE
Cholesterol: 241 mg/dL — ABNORMAL HIGH (ref <200)
HDL Cholesterol by NMR: 51 mg/dL (ref >=40)
HDL Particle Number: 41.3 umol/L (ref >=30.5)
LDL Particle Number: 1821 nmol/L — ABNORMAL HIGH (ref <1000)
LDL Size: 19.8 nm — ABNORMAL LOW (ref >20.5)
LP-IR Score: 78 — ABNORMAL HIGH (ref <=45)
Small LDL Particle Number: 1588 nmol/L — ABNORMAL HIGH (ref <=527)
Triglycerides by NMR: 577 mg/dL — ABNORMAL HIGH (ref <150)

## 2013-07-01 LAB — CMP14+EGFR
ALT: 22 IU/L (ref 0–44)
AST: 26 IU/L (ref 0–40)
Albumin/Globulin Ratio: 1.2 (ref 1.1–2.5)
Albumin: 3.4 g/dL — ABNORMAL LOW (ref 3.5–5.5)
Alkaline Phosphatase: 66 IU/L (ref 39–117)
BUN/Creatinine Ratio: 17 (ref 9–20)
BUN: 26 mg/dL — ABNORMAL HIGH (ref 6–24)
CO2: 21 mmol/L (ref 18–29)
Calcium: 8.9 mg/dL (ref 8.7–10.2)
Chloride: 105 mmol/L (ref 97–108)
Creatinine, Ser: 1.53 mg/dL — ABNORMAL HIGH (ref 0.76–1.27)
GFR calc Af Amer: 60 mL/min/{1.73_m2} (ref >59)
GFR calc non Af Amer: 52 mL/min/{1.73_m2} — ABNORMAL LOW (ref >59)
Globulin, Total: 2.8 g/dL (ref 1.5–4.5)
Glucose: 127 mg/dL — ABNORMAL HIGH (ref 65–99)
Potassium: 4.5 mmol/L (ref 3.5–5.2)
Sodium: 139 mmol/L (ref 134–144)
Total Bilirubin: 0.1 mg/dL (ref 0.0–1.2)
Total Protein: 6.2 g/dL (ref 6.0–8.5)

## 2013-07-04 ENCOUNTER — Ambulatory Visit: Payer: 59 | Admitting: General Practice

## 2013-07-04 VITALS — BP 171/75 | HR 79 | Temp 98.4°F | Wt 138.0 lb

## 2013-07-04 DIAGNOSIS — K4090 Unilateral inguinal hernia, without obstruction or gangrene, not specified as recurrent: Secondary | ICD-10-CM

## 2013-07-25 ENCOUNTER — Encounter: Payer: Self-pay | Admitting: Internal Medicine

## 2013-08-08 ENCOUNTER — Telehealth: Payer: Self-pay | Admitting: Family Medicine

## 2013-08-08 NOTE — Telephone Encounter (Signed)
done

## 2013-09-07 ENCOUNTER — Ambulatory Visit (AMBULATORY_SURGERY_CENTER): Payer: Self-pay | Admitting: *Deleted

## 2013-09-07 VITALS — Ht 65.5 in | Wt 136.8 lb

## 2013-09-07 DIAGNOSIS — Z1211 Encounter for screening for malignant neoplasm of colon: Secondary | ICD-10-CM

## 2013-09-07 MED ORDER — PEG-KCL-NACL-NASULF-NA ASC-C 100 G PO SOLR: ORAL | Status: DC

## 2013-09-07 NOTE — Progress Notes (Signed)
No egg or soy allergy  Interpretor present and assisted pt with questions

## 2013-09-07 NOTE — Progress Notes (Signed)
This visit was originally filed as a worker's comp injury but was denied. Original notes can be found scanned in.  S-Patient presents today with complaints of right groin area pain. Reports onset as yesterday while at work. 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 Verbalized continuing to work remainder of shift. Reports swelling in the area today.   O-Denies abd pain, change in bowel or bladder habits, reports right groin area tenderness/pain with standing, pos pulling sensation, neg for numbness or tingling.  A-Negative abd pain, pos BS x 4 quadrants. Edema noted to right groin area and painful with palpation.  P-right inguinal hernia, direct CT of abd pelvis  Work restriction- no lifting  Ibuprofen 800mg  1 po BID for pain, PRN # 20 with no refills Seek emergency medical treatment if symptoms worsen Patient verbalized understanding

## 2013-09-13 ENCOUNTER — Encounter: Payer: Self-pay | Admitting: Internal Medicine

## 2013-09-17 ENCOUNTER — Encounter: Payer: Self-pay | Admitting: Internal Medicine

## 2013-09-17 ENCOUNTER — Ambulatory Visit (AMBULATORY_SURGERY_CENTER): Payer: 59 | Admitting: Internal Medicine

## 2013-09-17 VITALS — BP 149/79 | HR 65 | Temp 97.4°F | Resp 26 | Ht 65.0 in | Wt 136.0 lb

## 2013-09-17 DIAGNOSIS — Z1211 Encounter for screening for malignant neoplasm of colon: Secondary | ICD-10-CM

## 2013-09-17 DIAGNOSIS — D126 Benign neoplasm of colon, unspecified: Secondary | ICD-10-CM

## 2013-09-17 LAB — GLUCOSE, CAPILLARY
Glucose-Capillary: 90 mg/dL (ref 70–99)
Glucose-Capillary: 87 mg/dL (ref 70–99)

## 2013-09-17 MED ORDER — SODIUM CHLORIDE 0.9 % IV SOLN: 500.0000 mL | INTRAVENOUS | Status: DC

## 2013-09-17 NOTE — Progress Notes (Signed)
Procedure ends, to recovery, report given and VSS. 

## 2013-09-17 NOTE — Patient Instructions (Signed)
YOU HAD AN ENDOSCOPIC PROCEDURE TODAY AT THE [facility]: Refer to the procedure report that was given to you for any specific questions about what was found during the examination.  If the procedure report does not answer your questions, please call your gastroenterologist to clarify.  If you requested that your care partner not be given the details of your procedure findings, then the procedure report has been included in a sealed envelope for you to review at your convenience later.  YOU SHOULD EXPECT: Some feelings of bloating in the abdomen. Passage of more gas than usual.  Walking can help get rid of the air that was put into your GI tract during the procedure and reduce the bloating. If you had a lower endoscopy (such as a colonoscopy or flexible sigmoidoscopy) you may notice spotting of blood in your stool or on the toilet paper. If you underwent a bowel prep for your procedure, then you may not have a normal bowel movement for a few days.  DIET: Your first meal following the procedure should be a light meal and then it is ok to progress to your normal diet.  A half-sandwich or bowl of soup is an example of a good first meal.  Heavy or fried foods are harder to digest and may make you feel nauseous or bloated.  Likewise meals heavy in dairy and vegetables can cause extra gas to form and this can also increase the bloating.  Drink plenty of fluids but you should avoid alcoholic beverages for 24 hours.  ACTIVITY: Your care partner should take you home directly after the procedure.  You should plan to take it easy, moving slowly for the rest of the day.  You can resume normal activity the day after the procedure however you should NOT DRIVE or use heavy machinery for 24 hours (because of the sedation medicines used during the test).    SYMPTOMS TO REPORT IMMEDIATELY: A gastroenterologist can be reached at any hour.  During normal business hours, 8:30 AM to 5:00 PM Monday through Friday,  call [phone].  After hours and on weekends, please call the GI answering service at [phone] who will take a message and have the physician on call contact you.   Following lower endoscopy (colonoscopy or flexible sigmoidoscopy):  Excessive amounts of blood in the stool  Significant tenderness or worsening of abdominal pains  Swelling of the abdomen that is new, acute  Fever of 100F or higher  FOLLOW UP: If any biopsies were taken you will be contacted by phone or by letter within the next 1-3 weeks.  Call your gastroenterologist if you have not heard about the biopsies in 3 weeks.  Our staff will call the home number listed on your records the next business day following your procedure to check on you and address any questions or concerns that you may have at that time regarding the information given to you following your procedure. This is a courtesy call and so if there is no answer at the home number and we have not heard from you through the emergency physician on call, we will assume that you have returned to your regular daily activities without incident.  SIGNATURES/CONFIDENTIALITY: You and/or your care partner have signed paperwork which will be entered into your electronic medical record.  These signatures attest to the fact that that the information above on your After Visit Summary has been reviewed and is understood.  Full responsibility of the confidentiality of this   discharge information lies with you and/or your care-partner.  AWAIT PATHOLOGY   PLEASE CONTINUE YOUR NORMAL MEDICATIONS  PLEASE READ OVER HANDOUTS ABOUT POLYPS, DIVERTICULOSIS, AND HIGH FIBER DIETS

## 2013-09-17 NOTE — Op Note (Signed)
[facility]  [address] [place], [address]   COLONOSCOPY PROCEDURE REPORT  PATIENT: Craig Fisher, Craig Fisher  MR#: [id number] BIRTHDATE: 09-01-1960 , 52  yrs. old GENDER: Male ENDOSCOPIST: Jerene Bears, MD REFERRED [id number] Jacelyn Grip, M.D. PROCEDURE DATE:  09/17/2013 PROCEDURE:   Colonoscopy with cold biopsy polypectomy First Screening Colonoscopy - Avg.  risk and is 50 yrs.  old or older Yes.  Prior Negative Screening - Now for repeat screening. N/A  History of Adenoma - Now for follow-up colonoscopy & has been > or = to 3 yrs.  N/A  Polyps Removed Today? Yes. ASA CLASS:   Class II INDICATIONS:average risk screening and first colonoscopy. MEDICATIONS: MAC sedation, administered by CRNA and propofol (Diprivan) 200mg  IV  DESCRIPTION OF PROCEDURE:   After the risks benefits and alternatives of the procedure were thoroughly explained, informed consent was obtained.  A digital rectal exam revealed no rectal mass and A digital rectal exam revealed decreased sphincter tone. The LB [id number]  endoscope was introduced through the anus and advanced to the cecum, which was identified by both the appendix and ileocecal valve. No adverse events experienced.   The quality of the prep was good, using MoviPrep  The instrument was then slowly withdrawn as the colon was fully examined.   COLON FINDINGS: A sessile polyp measuring 4 mm in size was found in the descending colon.  A polypectomy was performed with cold forceps.  The resection was complete and the polyp tissue was completely retrieved.   There was mild scattered diverticulosis noted in the left colon.  Retroflexion was not performed due to a shallow rectal vault. The time to cecum=3 minutes 10 seconds. Withdrawal time=10 minutes 15 seconds.  The scope was withdrawn and the procedure completed. COMPLICATIONS: There were no complications.  ENDOSCOPIC IMPRESSION: 1.   Sessile polyp measuring 4 mm in size was found in  the descending colon; polypectomy was performed with cold forceps 2.   There was mild diverticulosis noted in the left colon  RECOMMENDATIONS: 1.  Await pathology results 2.  High fiber diet 3.  If the polyp removed today is proven to be an adenomatous (pre-cancerous) polyp, you will need a repeat colonoscopy in 5 years.  Otherwise you should continue to follow colorectal cancer screening guidelines for "routine risk" patients with colonoscopy in 10 years.  You will receive a letter within 1-2 weeks with the results of your biopsy as well as final recommendations.  Please call my office if you have not received a letter after 3 weeks.   eSigned:  Jerene Bears, MD 09/17/2013 9:56 AM cc: The Patient and Ulanda Edison MD

## 2013-09-17 NOTE — Progress Notes (Signed)
Called to room to assist during endoscopic procedure.  Patient ID and intended procedure confirmed with present staff. Received instructions for my participation in the procedure from the performing physician.  

## 2013-09-18 ENCOUNTER — Telehealth: Payer: Self-pay

## 2013-09-18 NOTE — Telephone Encounter (Signed)
Unable to leave message no voice mail set up. 

## 2013-09-20 ENCOUNTER — Encounter: Payer: Self-pay | Admitting: Internal Medicine

## 2013-09-24 ENCOUNTER — Encounter: Payer: Self-pay | Admitting: Pharmacist

## 2013-09-24 ENCOUNTER — Ambulatory Visit (INDEPENDENT_AMBULATORY_CARE_PROVIDER_SITE_OTHER): Payer: 59 | Admitting: Pharmacist

## 2013-09-24 VITALS — BP 148/78 | HR 77 | Ht 65.0 in | Wt 133.0 lb

## 2013-09-24 DIAGNOSIS — E119 Type 2 diabetes mellitus without complications: Secondary | ICD-10-CM

## 2013-09-24 DIAGNOSIS — E785 Hyperlipidemia, unspecified: Secondary | ICD-10-CM

## 2013-09-24 DIAGNOSIS — I10 Essential (primary) hypertension: Secondary | ICD-10-CM

## 2013-09-24 MED ORDER — LISINOPRIL 20 MG PO TABS: 20.0000 mg | ORAL_TABLET | Freq: Every day | ORAL | Status: DC

## 2013-09-24 NOTE — Progress Notes (Signed)
Diabetes Follow-Up Visit Chief Complaint:   Chief Complaint  Patient presents with  . Diabetes  . Hyperlipidemia  . Hypertension     Filed Vitals:   09/24/13 0835  BP: 148/78  Pulse: 77     HPI: Craig Fisher is referred by Dr Jacelyn Grip to evaluate elevated Triglycerides and diabetes.   Patient's last labs show Tg of 577 (non fasting labs).  He is taking Crestor 40mg  daily.  Not following any particular fat or CHO limiting diet  HTN - patient's BP slightly elevated today but he asks for refill on Lisinopril (was told by pharmacy that Rx was too old to fill).  He has been out of lisinopril for about 1 month.  DM appears to be controlled - last A1c was 6.9%  Current Diabetes Medications:  Lantus 25 units Qhs and Tradgenta 5mg  daily  Home BG Monitoring:  Checking 1 times a day. Average:  125  High: 140  Low:  105 Last hypoglycemic event was 3 weeks ago (BG was 65) Last BG over 200 was 3 months ago.  Low fat/carbohydrate diet?  No Nicotine Abuse?  No Medication Compliance?  No Exercise?  No Alcohol Abuse?  No   Exam Edema:  negative   BMI:  Body mass index is 22.13 kg/(m^2).   Weight changes:  stable General Appearance:  alert, oriented, no acute distress and well nourished Mood/Affect:  normal **patient has interrupter Craig Fisher with him today**   Lab Results  Component Value Date   HGBA1C 6.9 06/29/2013    Lab Results  Component Value Date   MICROALBUR 77.59* 11/20/2012    LDL-P = 1821 Tg = 577 Tg = 241    Assessment: 1.  Diabetes.  Fairly good control 2.  Blood Pressure.  Elevated due to needing rx for lisinopril 3.  Lipids.  Very elevated Tg - compounded by non fasting labs  Recommendations: 1.  Medication recommendations at this time are as follows:    Restart lisinopril 20mg  daily - rx sent to [facility] 2.  Reviewed HBG goals:  Fasting 80-130 and 1-2 hour post prandial <180.  Patient is instructed to check BG 1 times per day.    3.  BP goal < 140/85. 4.  LDL  goal of < 100, HDL > 40 and TG < 150. 5.  Eye Exam yearly and Dental Exam every 6 months. 6.  Dietary recommendations:  Discussed foods that increase Tg and BG - patient to begin limiting serving sizes and amounts of these foods 7.   Orders Placed This Encounter  Procedures  . NMR, lipoprofile     Pending results of NMR/Lipid panel will consider adding fenofibrate if Tg still elevated  9.  Return to clinic in 4-6 wks- has appt with PCP - Dr Jacelyn Grip   Time spent counseling patient:  30 minutes  Referring provider:  Tish Men, PharmD, CPP

## 2013-09-26 LAB — NMR, LIPOPROFILE
Cholesterol: 173 mg/dL (ref <200)
HDL CHOLESTEROL BY NMR: 49 mg/dL (ref >=40)
HDL PARTICLE NUMBER: 37.8 umol/L (ref >=30.5)
LDL PARTICLE NUMBER: 965 nmol/L (ref <1000)
LDL Size: 19.6 nm — ABNORMAL LOW (ref >20.5)
LDLC SERPL CALC-MCNC: 54 mg/dL (ref <100)
LP-IR Score: 51 — ABNORMAL HIGH (ref <=45)
Small LDL Particle Number: 878 nmol/L — ABNORMAL HIGH (ref <=527)
Triglycerides by NMR: 352 mg/dL — ABNORMAL HIGH (ref <150)

## 2013-09-28 ENCOUNTER — Ambulatory Visit: Payer: 59 | Admitting: Family Medicine

## 2013-10-01 ENCOUNTER — Encounter: Payer: Self-pay | Admitting: Pharmacist

## 2013-10-03 ENCOUNTER — Encounter: Payer: Self-pay | Admitting: *Deleted

## 2013-11-02 ENCOUNTER — Ambulatory Visit (INDEPENDENT_AMBULATORY_CARE_PROVIDER_SITE_OTHER): Payer: 59 | Admitting: Family Medicine

## 2013-11-02 ENCOUNTER — Encounter: Payer: Self-pay | Admitting: Family Medicine

## 2013-11-02 VITALS — BP 151/79 | HR 70 | Temp 98.3°F | Ht 65.0 in | Wt 136.8 lb

## 2013-11-02 DIAGNOSIS — E119 Type 2 diabetes mellitus without complications: Secondary | ICD-10-CM

## 2013-11-02 DIAGNOSIS — K4090 Unilateral inguinal hernia, without obstruction or gangrene, not specified as recurrent: Secondary | ICD-10-CM

## 2013-11-02 DIAGNOSIS — E1139 Type 2 diabetes mellitus with other diabetic ophthalmic complication: Secondary | ICD-10-CM

## 2013-11-02 DIAGNOSIS — E11319 Type 2 diabetes mellitus with unspecified diabetic retinopathy without macular edema: Secondary | ICD-10-CM

## 2013-11-02 DIAGNOSIS — E785 Hyperlipidemia, unspecified: Secondary | ICD-10-CM

## 2013-11-02 DIAGNOSIS — I10 Essential (primary) hypertension: Secondary | ICD-10-CM

## 2013-11-02 LAB — POCT GLYCOSYLATED HEMOGLOBIN (HGB A1C): Hemoglobin A1C: 6.5

## 2013-11-02 MED ORDER — AMLODIPINE BESYLATE 10 MG PO TABS: 10.0000 mg | ORAL_TABLET | Freq: Every day | ORAL | Status: DC

## 2013-11-02 NOTE — Progress Notes (Signed)
Patient ID: Craig Fisher, male   DOB: 12-Dec-1960, 53 y.o.   MRN: [id number] SUBJECTIVE: CC: Chief Complaint  Patient presents with  . Follow-up    3 month follow up c/o rt hernia pain     HPI:  Seen with a Spanish interpreter. Seen by Lynelle Smoke and is doing better with his  Diet. Patient is here for follow up of Diabetes Mellitus/HLD/HTN Symptoms evaluated: Denies Nocturia ,Denies Urinary Frequency , denies Blurred vision ,deniesDizziness,denies.Dysuria,  paresthesias the left big toe has had numbness for some years. Once he sustained a lacertaionand infection and subsequent burn from hot water soaks. So he has learnt to be careful and checks his feet daily. ,  denies extremity pain or ulcers.Marland Kitchendenies chest pain. has had an annual eye exam. do check the feet. Does check CBGs. Average SWH:QPRFFM Denies episodes of hypoglycemia. Does have an emergency hypoglycemic plan. admits toCompliance with medications. Denies Problems with medications.   Past Medical History  Diagnosis Date  . Diabetes mellitus without complication   . Hyperlipidemia   . Hypertension    Past Surgical History  Procedure Laterality Date  . Eye surgery      both eyes   History   Social History  . Marital Status: Married    Spouse Name: N/A    Number of Children: N/A  . Years of Education: N/A   Occupational History  . Not on file.   Social History Main Topics  . Smoking status: Never Smoker   . Smokeless tobacco: Never Used  . Alcohol Use: No  . Drug Use: No  . Sexual Activity: Not on file   Other Topics Concern  . Not on file   Social History Narrative  . No narrative on file   Family History  Problem Relation Age of Onset  . Diabetes Mother   . Diabetes Sister   . Esophageal cancer Neg Hx   . Colon cancer Neg Hx   . Rectal cancer Neg Hx   . Stomach cancer Neg Hx    Current Outpatient Prescriptions on File Prior to Visit  Medication Sig Dispense Refill  . insulin glargine (LANTUS) 100  UNIT/ML injection Inject 25 Units into the skin at bedtime.      Marland Kitchen linagliptin (TRADJENTA) 5 MG TABS tablet Take 1 tablet (5 mg total) by mouth daily.  30 tablet  5  . lisinopril (PRINIVIL,ZESTRIL) 20 MG tablet Take 1 tablet (20 mg total) by mouth daily.  30 tablet  5  . rosuvastatin (CRESTOR) 40 MG tablet Take 1 tablet (40 mg total) by mouth daily.  30 tablet  5  . timolol (TIMOPTIC) 0.5 % ophthalmic solution Right eye daily- 1 drop       No current facility-administered medications on file prior to visit.   No Known Allergies Immunization History  Administered Date(s) Administered  . Pneumococcal Conjugate-13 06/29/2013  . Tdap 07/19/2010   Prior to Admission medications   Medication Sig Start Date End Date Taking? Authorizing Provider  amLODipine (NORVASC) 5 MG tablet Take 1 tablet (5 mg total) by mouth daily. 11/24/12   Vernie Shanks, MD  insulin glargine (LANTUS) 100 UNIT/ML injection Inject 25 Units into the skin at bedtime.    Historical Provider, MD  linagliptin (TRADJENTA) 5 MG TABS tablet Take 1 tablet (5 mg total) by mouth daily. 11/24/12   Vernie Shanks, MD  lisinopril (PRINIVIL,ZESTRIL) 20 MG tablet Take 1 tablet (20 mg total) by mouth daily. 09/24/13   Cherre Robins, PHARMD  rosuvastatin (CRESTOR) 40 MG tablet Take 1 tablet (40 mg total) by mouth daily. 03/30/13   Vernie Shanks, MD  timolol (TIMOPTIC) 0.5 % ophthalmic solution Right eye daily- 1 drop 02/15/13   Historical Provider, MD     ROS: As above in the HPI. All other systems are stable or negative.  OBJECTIVE: APPEARANCE:  Patient in no acute distress.The patient appeared well nourished and normally developed. Acyanotic. Waist: VITAL SIGNS:BP 151/79  Pulse 70  Temp(Src) 98.3 F (36.8 C) (Oral)  Ht 5' 5"  (1.651 m)  Wt 136 lb 12.8 oz (62.052 kg)  BMI 22.76 kg/m2   SKIN: warm and  Dry without overt rashes, tattoos and scars  HEAD and Neck: without JVD, Head and scalp: normal Eyes:No scleral icterus. Fundi  normal, eye movements normal. Ears: Auricle normal, canal normal, Tympanic membranes normal, insufflation normal. Nose: normal Throat: normal Neck & thyroid: normal  CHEST & LUNGS: Chest wall: normal Lungs: Clear  CVS: Reveals the PMI to be normally located. Regular rhythm, First and Second Heart sounds are normal,  absence of murmurs, rubs or gallops. Peripheral vasculature: Radial pulses: normal Dorsal pedis pulses: normal Posterior pulses: normal  ABDOMEN:  Appearance: normal Benign, no organomegaly, no masses, no Abdominal Aortic enlargement. No Guarding , no rebound. No Bruits. Bowel sounds: normal  RECTAL: N/A GU: right direct inguinal hernia/coughimpulse. Reducible   EXTREMETIES: nonedematous.  MUSCULOSKELETAL:  Spine: normal Joints: intact  NEUROLOGIC: oriented to time,place and person; nonfocal. Strength is normal Sensory is absent on the left big toe.  Reflexes are normal Cranial Nerves are normal. See DM foot exam module.  ASSESSMENT: HTN (hypertension) - Plan: amLODipine (NORVASC) 10 MG tablet  DM (diabetes mellitus) - Plan: POCT glycosylated hemoglobin (Hb A1C), CMP14+EGFR  Diabetic retinopathy  HLD (hyperlipidemia)  Inguinal hernia - Plan: Ambulatory referral to General Surgery  BP not at goal.   PLAN: If hernia becomes symptomatic will need to go to the ED. Otherwise referral to the surgeon.      Dr Paula Libra Recommendations  For nutrition information, I recommend books:  1).Eat to Live by Dr Excell Seltzer. 2).Prevent and Reverse Heart Disease by Dr Karl Luke. 3) Dr Janene Harvey Book:  Program to Reverse Diabetes  Exercise recommendations are:  If unable to walk, then the patient can exercise in a chair 3 times a day. By flapping arms like a bird gently and raising legs outwards to the front.  If ambulatory, the patient can go for walks for 30 minutes 3 times a week. Then increase the intensity and duration as  tolerated.  Goal is to try to attain exercise frequency to 5 times a week.  If applicable: Best to perform resistance exercises (machines or weights) 2 days a week and cardio type exercises 3 days per week.  DM foot care. Plant based  Diet discussed.  Orders Placed This Encounter  Procedures  . CMP14+EGFR  . Ambulatory referral to General Surgery    Referral Priority:  Routine    Referral Type:  Surgical    Referral Reason:  Specialty Services Required    Requested Specialty:  General Surgery    Number of Visits Requested:  1  . POCT glycosylated hemoglobin (Hb A1C)   Meds ordered this encounter  Medications  . amLODipine (NORVASC) 10 MG tablet    Sig: Take 1 tablet (10 mg total) by mouth daily.    Dispense:  30 tablet    Refill:  3   Medications Discontinued During This  Encounter  Medication Reason  . amLODipine (NORVASC) 5 MG tablet Reorder   Return in about 3 months (around 02/01/2014) for Recheck medical problems.  Merryn Thaker P. Jacelyn Grip, M.D.

## 2013-11-03 ENCOUNTER — Other Ambulatory Visit: Payer: Self-pay | Admitting: Family Medicine

## 2013-11-03 DIAGNOSIS — N183 Chronic kidney disease, stage 3 unspecified: Secondary | ICD-10-CM

## 2013-11-03 LAB — CMP14+EGFR
ALT: 29 IU/L (ref 0–44)
AST: 32 IU/L (ref 0–40)
Albumin/Globulin Ratio: 1.6 (ref 1.1–2.5)
Albumin: 3.8 g/dL (ref 3.5–5.5)
Alkaline Phosphatase: 68 IU/L (ref 39–117)
BUN/Creatinine Ratio: 17 (ref 9–20)
BUN: 33 mg/dL — ABNORMAL HIGH (ref 6–24)
CO2: 23 mmol/L (ref 18–29)
Calcium: 8.5 mg/dL — ABNORMAL LOW (ref 8.7–10.2)
Chloride: 104 mmol/L (ref 97–108)
Creatinine, Ser: 1.99 mg/dL — ABNORMAL HIGH (ref 0.76–1.27)
GFR calc Af Amer: 43 mL/min/{1.73_m2} — ABNORMAL LOW (ref >59)
GFR calc non Af Amer: 37 mL/min/{1.73_m2} — ABNORMAL LOW (ref >59)
Globulin, Total: 2.4 g/dL (ref 1.5–4.5)
Glucose: 107 mg/dL — ABNORMAL HIGH (ref 65–99)
Potassium: 4.4 mmol/L (ref 3.5–5.2)
Sodium: 139 mmol/L (ref 134–144)
Total Bilirubin: 0.2 mg/dL (ref 0.0–1.2)
Total Protein: 6.2 g/dL (ref 6.0–8.5)

## 2013-11-03 NOTE — Progress Notes (Signed)
Quick Note:  Call Patient Labs that are abnormal:  The kidney function is deteriorating more. The hgba1c was good. Recommendations: Needs to repeat the kidney functions in 2 weeks. ordered in EPIC   ______

## 2013-11-21 ENCOUNTER — Ambulatory Visit (INDEPENDENT_AMBULATORY_CARE_PROVIDER_SITE_OTHER): Payer: 59 | Admitting: Surgery

## 2014-01-01 ENCOUNTER — Other Ambulatory Visit (INDEPENDENT_AMBULATORY_CARE_PROVIDER_SITE_OTHER): Payer: 59

## 2014-01-01 DIAGNOSIS — N183 Chronic kidney disease, stage 3 unspecified: Secondary | ICD-10-CM

## 2014-01-02 LAB — BMP8+EGFR
BUN / CREAT RATIO: 23 — AB (ref 9–20)
BUN: 53 mg/dL — AB (ref 6–24)
CALCIUM: 8.5 mg/dL — AB (ref 8.7–10.2)
CHLORIDE: 104 mmol/L (ref 97–108)
CO2: 16 mmol/L — ABNORMAL LOW (ref 18–29)
CREATININE: 2.32 mg/dL — AB (ref 0.76–1.27)
GFR calc Af Amer: 36 mL/min/{1.73_m2} — ABNORMAL LOW (ref >59)
GFR calc non Af Amer: 31 mL/min/{1.73_m2} — ABNORMAL LOW (ref >59)
Glucose: 191 mg/dL — ABNORMAL HIGH (ref 65–99)
Potassium: 5.5 mmol/L — ABNORMAL HIGH (ref 3.5–5.2)
Sodium: 133 mmol/L — ABNORMAL LOW (ref 134–144)

## 2014-01-07 ENCOUNTER — Telehealth: Payer: Self-pay | Admitting: Family Medicine

## 2014-01-07 NOTE — Telephone Encounter (Signed)
Message copied by Waverly Ferrari on Mon Jan 07, 2014 10:39 AM ------      Message from: Chipper Herb      Created: Wed Jan 02, 2014  9:47 AM       The blood sugar is elevated at 191. The creatinine is more elevated than it has been in the past at 2.32. The electrolytes have a sodium that is slightly decreased. The potassium is slightly increased at 5.5.      Because of the abnormalities on this lab report, the patient needs to have a redraw BMP. He also needs to have an appointment with a provider as soon as possible and an appointment with a nephrologist because of the significant rise in the creatinine. This should also be done as soon as possible.++++++++++++ ------

## 2014-01-08 NOTE — Telephone Encounter (Signed)
Patients daughter aware of labs after she talks with him she is going to call back and make an appointment ASAP per [facility]

## 2014-01-23 ENCOUNTER — Ambulatory Visit (INDEPENDENT_AMBULATORY_CARE_PROVIDER_SITE_OTHER): Payer: 59 | Admitting: Surgery

## 2014-01-23 ENCOUNTER — Encounter (INDEPENDENT_AMBULATORY_CARE_PROVIDER_SITE_OTHER): Payer: Self-pay | Admitting: Surgery

## 2014-01-23 VITALS — BP 130/70 | HR 72 | Temp 98.0°F | Ht 63.0 in | Wt 134.0 lb

## 2014-01-23 DIAGNOSIS — K4020 Bilateral inguinal hernia, without obstruction or gangrene, not specified as recurrent: Secondary | ICD-10-CM | POA: Insufficient documentation

## 2014-01-23 DIAGNOSIS — K4090 Unilateral inguinal hernia, without obstruction or gangrene, not specified as recurrent: Secondary | ICD-10-CM

## 2014-01-23 NOTE — Progress Notes (Signed)
Patient ID: Craig Fisher, male   DOB: 19-Mar-1961, 53 y.o.   MRN: [id number]  Chief Complaint  Patient presents with  . Inguinal Hernia    HPI Craig Fisher is a 53 y.o. male.   HPI This is a pleasant gentleman referred by Dr. Jacelyn Grip for evaluation of a right inguinal hernia. The patient reports noticing it after heavy lifting back in December. He reports having an easily reducible right groin bulge which is now nontender. He has minimal discomfort. He has also had a little bit of discomfort on the left inguinal area. He is otherwise without complaints. Past Medical History  Diagnosis Date  . Diabetes mellitus without complication   . Hyperlipidemia   . Hypertension     Past Surgical History  Procedure Laterality Date  . Eye surgery      both eyes  . Eyes      Family History  Problem Relation Age of Onset  . Diabetes Mother   . Diabetes Sister   . Esophageal cancer Neg Hx   . Colon cancer Neg Hx   . Rectal cancer Neg Hx   . Stomach cancer Neg Hx     Social History History  Substance Use Topics  . Smoking status: Never Smoker   . Smokeless tobacco: Never Used  . Alcohol Use: No    No Known Allergies  Current Outpatient Prescriptions  Medication Sig Dispense Refill  . amLODipine (NORVASC) 10 MG tablet Take 1 tablet (10 mg total) by mouth daily.  30 tablet  3  . insulin glargine (LANTUS) 100 UNIT/ML injection Inject 25 Units into the skin at bedtime.      Marland Kitchen linagliptin (TRADJENTA) 5 MG TABS tablet Take 1 tablet (5 mg total) by mouth daily.  30 tablet  5  . lisinopril (PRINIVIL,ZESTRIL) 20 MG tablet Take 1 tablet (20 mg total) by mouth daily.  30 tablet  5  . rosuvastatin (CRESTOR) 40 MG tablet Take 1 tablet (40 mg total) by mouth daily.  30 tablet  5  . timolol (TIMOPTIC) 0.5 % ophthalmic solution Right eye daily- 1 drop       No current facility-administered medications for this visit.    Review of Systems Review of Systems  Constitutional: Negative for fever, chills  and unexpected weight change.  HENT: Negative for congestion, hearing loss, sore throat, trouble swallowing and voice change.   Eyes: Negative for visual disturbance.  Respiratory: Negative for cough and wheezing.   Cardiovascular: Negative for chest pain, palpitations and leg swelling.  Gastrointestinal: Negative for nausea, vomiting, abdominal pain, diarrhea, constipation, blood in stool, abdominal distention, anal bleeding and rectal pain.  Genitourinary: Negative for hematuria and difficulty urinating.  Musculoskeletal: Negative for arthralgias.  Skin: Negative for rash and wound.  Neurological: Negative for seizures, syncope, weakness and headaches.  Hematological: Negative for adenopathy. Does not bruise/bleed easily.  Psychiatric/Behavioral: Negative for confusion.    Blood pressure 130/70, pulse 72, temperature 98 F (36.7 C), height 5\' 3"  (1.6 m), weight 134 lb (60.782 kg).  Physical Exam Physical Exam  Constitutional: He is oriented to person, place, and time. He appears well-developed and well-nourished. No distress.  HENT:  Head: Normocephalic and atraumatic.  Right Ear: External ear normal.  Left Ear: External ear normal.  Nose: Nose normal.  Mouth/Throat: Oropharynx is clear and moist. No oropharyngeal exudate.  Eyes: Conjunctivae are normal. Pupils are equal, round, and reactive to light. Right eye exhibits no discharge. Left eye exhibits no discharge. No scleral icterus.  Neck: Normal range of motion. No tracheal deviation present.  Cardiovascular: Normal rate, regular rhythm, normal heart sounds and intact distal pulses.   No murmur heard. Pulmonary/Chest: Effort normal and breath sounds normal. No respiratory distress. He has no wheezes. He has no rales.  Abdominal: Soft. Bowel sounds are normal. He exhibits no distension. There is no tenderness. There is no rebound.  Easily reducible right inguinal hernia Slightly weakened left inguinal area  Musculoskeletal:  Normal range of motion. He exhibits no edema and no tenderness.  Lymphadenopathy:    He has no cervical adenopathy.  Neurological: He is alert and oriented to person, place, and time.  Skin: Skin is warm and dry. No rash noted. He is not diaphoretic. No erythema.  Psychiatric: His behavior is normal.    Data Reviewed   Assessment    Right inguinal hernia     Plan    I discussed this with the patient in detail. I recommend repair of the hernia with mesh. I discussed both the laparoscopic and open techniques. I would recommend laparoscopic repair with mesh so I can evaluate the left inguinal area to see if there may be a hernia present there as he is mildly symptomatic. I discussed the risks of surgery with him. Laparoscopic inguinal hernia with Mesh will be performed        Gilliam Hawkes A 01/23/2014, 4:00 PM

## 2014-03-04 ENCOUNTER — Encounter (HOSPITAL_BASED_OUTPATIENT_CLINIC_OR_DEPARTMENT_OTHER): Payer: Self-pay | Admitting: *Deleted

## 2014-03-04 NOTE — Progress Notes (Signed)
Gave the daughter the number to [facility] to get bmet and ekg-she will see if he can go-working 8am to 6pm evey day-if not will need to do am of surgery An interpreter has been requested

## 2014-03-05 ENCOUNTER — Encounter (HOSPITAL_BASED_OUTPATIENT_CLINIC_OR_DEPARTMENT_OTHER): Payer: Self-pay | Admitting: *Deleted

## 2014-03-07 NOTE — H&P (Signed)
Chief Complaint   Patient presents with   .  Inguinal Hernia   HPI  Craig Fisher is a 53 y.o. male.  HPI  This is a pleasant gentleman referred by Dr. Jacelyn Grip for evaluation of a right inguinal hernia. The patient reports noticing it after heavy lifting back in December. He reports having an easily reducible right groin bulge which is now nontender. He has minimal discomfort. He has also had a little bit of discomfort on the left inguinal area. He is otherwise without complaints.  Past Medical History   Diagnosis  Date   .  Diabetes mellitus without complication    .  Hyperlipidemia    .  Hypertension     Past Surgical History   Procedure  Laterality  Date   .  Eye surgery       both eyes   .  Eyes      Family History   Problem  Relation  Age of Onset   .  Diabetes  Mother    .  Diabetes  Sister    .  Esophageal cancer  Neg Hx    .  Colon cancer  Neg Hx    .  Rectal cancer  Neg Hx    .  Stomach cancer  Neg Hx    Social History  History   Substance Use Topics   .  Smoking status:  Never Smoker   .  Smokeless tobacco:  Never Used   .  Alcohol Use:  No   No Known Allergies  Current Outpatient Prescriptions   Medication  Sig  Dispense  Refill   .  amLODipine (NORVASC) 10 MG tablet  Take 1 tablet (10 mg total) by mouth daily.  30 tablet  3   .  insulin glargine (LANTUS) 100 UNIT/ML injection  Inject 25 Units into the skin at bedtime.     Marland Kitchen  linagliptin (TRADJENTA) 5 MG TABS tablet  Take 1 tablet (5 mg total) by mouth daily.  30 tablet  5   .  lisinopril (PRINIVIL,ZESTRIL) 20 MG tablet  Take 1 tablet (20 mg total) by mouth daily.  30 tablet  5   .  rosuvastatin (CRESTOR) 40 MG tablet  Take 1 tablet (40 mg total) by mouth daily.  30 tablet  5   .  timolol (TIMOPTIC) 0.5 % ophthalmic solution  Right eye daily- 1 drop      No current facility-administered medications for this visit.   Review of Systems  Review of Systems  Constitutional: Negative for fever, chills and unexpected  weight change.  HENT: Negative for congestion, hearing loss, sore throat, trouble swallowing and voice change.  Eyes: Negative for visual disturbance.  Respiratory: Negative for cough and wheezing.  Cardiovascular: Negative for chest pain, palpitations and leg swelling.  Gastrointestinal: Negative for nausea, vomiting, abdominal pain, diarrhea, constipation, blood in stool, abdominal distention, anal bleeding and rectal pain.  Genitourinary: Negative for hematuria and difficulty urinating.  Musculoskeletal: Negative for arthralgias.  Skin: Negative for rash and wound.  Neurological: Negative for seizures, syncope, weakness and headaches.  Hematological: Negative for adenopathy. Does not bruise/bleed easily.  Psychiatric/Behavioral: Negative for confusion.  Blood pressure 130/70, pulse 72, temperature 98 F (36.7 C), height 5\' 3"  (1.6 m), weight 134 lb (60.782 kg).  Physical Exam  Physical Exam  Constitutional: He is oriented to person, place, and time. He appears well-developed and well-nourished. No distress.  HENT:  Head: Normocephalic and atraumatic.  Right Ear: External  ear normal.  Left Ear: External ear normal.  Nose: Nose normal.  Mouth/Throat: Oropharynx is clear and moist. No oropharyngeal exudate.  Eyes: Conjunctivae are normal. Pupils are equal, round, and reactive to light. Right eye exhibits no discharge. Left eye exhibits no discharge. No scleral icterus.  Neck: Normal range of motion. No tracheal deviation present.  Cardiovascular: Normal rate, regular rhythm, normal heart sounds and intact distal pulses.  No murmur heard.  Pulmonary/Chest: Effort normal and breath sounds normal. No respiratory distress. He has no wheezes. He has no rales.  Abdominal: Soft. Bowel sounds are normal. He exhibits no distension. There is no tenderness. There is no rebound.  Easily reducible right inguinal hernia Slightly weakened left inguinal area  Musculoskeletal: Normal range of motion.  He exhibits no edema and no tenderness.  Lymphadenopathy:  He has no cervical adenopathy.  Neurological: He is alert and oriented to person, place, and time.  Skin: Skin is warm and dry. No rash noted. He is not diaphoretic. No erythema.  Psychiatric: His behavior is normal.  Data Reviewed  Assessment  Right inguinal hernia  Plan  I discussed this with the patient in detail. I recommend repair of the hernia with mesh. I discussed both the laparoscopic and open techniques. I would recommend laparoscopic repair with mesh so I can evaluate the left inguinal area to see if there may be a hernia present there as he is mildly symptomatic. I discussed the risks of surgery with him. Laparoscopic inguinal hernia with Mesh will be performed

## 2014-03-08 ENCOUNTER — Ambulatory Visit (HOSPITAL_BASED_OUTPATIENT_CLINIC_OR_DEPARTMENT_OTHER): Payer: 59 | Admitting: Anesthesiology

## 2014-03-08 ENCOUNTER — Encounter (HOSPITAL_BASED_OUTPATIENT_CLINIC_OR_DEPARTMENT_OTHER): Payer: Self-pay | Admitting: *Deleted

## 2014-03-08 ENCOUNTER — Ambulatory Visit (INDEPENDENT_AMBULATORY_CARE_PROVIDER_SITE_OTHER): Payer: 59 | Admitting: Family

## 2014-03-08 ENCOUNTER — Ambulatory Visit (HOSPITAL_BASED_OUTPATIENT_CLINIC_OR_DEPARTMENT_OTHER)
Admission: RE | Admit: 2014-03-08 | Discharge: 2014-03-08 | Disposition: A | Discharge status: Home / Self Care | Payer: 59 | Source: Ambulatory Visit | Attending: Surgery | Admitting: Surgery

## 2014-03-08 ENCOUNTER — Encounter (HOSPITAL_BASED_OUTPATIENT_CLINIC_OR_DEPARTMENT_OTHER): Payer: 59 | Admitting: Anesthesiology

## 2014-03-08 ENCOUNTER — Encounter: Payer: Self-pay | Admitting: Family

## 2014-03-08 ENCOUNTER — Encounter (HOSPITAL_BASED_OUTPATIENT_CLINIC_OR_DEPARTMENT_OTHER)
Admission: RE | Disposition: A | Discharge status: Home / Self Care | Payer: 59 | Source: Ambulatory Visit | Attending: Surgery

## 2014-03-08 VITALS — BP 162/76 | HR 60 | Temp 97.7°F | Ht 63.0 in | Wt 132.4 lb

## 2014-03-08 DIAGNOSIS — Z125 Encounter for screening for malignant neoplasm of prostate: Secondary | ICD-10-CM

## 2014-03-08 DIAGNOSIS — K4090 Unilateral inguinal hernia, without obstruction or gangrene, not specified as recurrent: Secondary | ICD-10-CM | POA: Diagnosis not present

## 2014-03-08 DIAGNOSIS — E1121 Type 2 diabetes mellitus with diabetic nephropathy: Secondary | ICD-10-CM

## 2014-03-08 DIAGNOSIS — I10 Essential (primary) hypertension: Secondary | ICD-10-CM | POA: Insufficient documentation

## 2014-03-08 DIAGNOSIS — Z1321 Encounter for screening for nutritional disorder: Secondary | ICD-10-CM

## 2014-03-08 DIAGNOSIS — Z794 Long term (current) use of insulin: Secondary | ICD-10-CM | POA: Diagnosis not present

## 2014-03-08 DIAGNOSIS — Z79899 Other long term (current) drug therapy: Secondary | ICD-10-CM | POA: Diagnosis not present

## 2014-03-08 DIAGNOSIS — Z5309 Procedure and treatment not carried out because of other contraindication: Secondary | ICD-10-CM | POA: Diagnosis not present

## 2014-03-08 DIAGNOSIS — E785 Hyperlipidemia, unspecified: Secondary | ICD-10-CM

## 2014-03-08 DIAGNOSIS — R7989 Other specified abnormal findings of blood chemistry: Secondary | ICD-10-CM

## 2014-03-08 DIAGNOSIS — R799 Abnormal finding of blood chemistry, unspecified: Secondary | ICD-10-CM

## 2014-03-08 DIAGNOSIS — E1129 Type 2 diabetes mellitus with other diabetic kidney complication: Secondary | ICD-10-CM

## 2014-03-08 DIAGNOSIS — N058 Unspecified nephritic syndrome with other morphologic changes: Secondary | ICD-10-CM

## 2014-03-08 DIAGNOSIS — E119 Type 2 diabetes mellitus without complications: Secondary | ICD-10-CM | POA: Diagnosis not present

## 2014-03-08 DIAGNOSIS — N289 Disorder of kidney and ureter, unspecified: Secondary | ICD-10-CM | POA: Insufficient documentation

## 2014-03-08 HISTORY — DX: Presence of spectacles and contact lenses: Z97.3

## 2014-03-08 HISTORY — DX: Complete loss of teeth, unspecified cause, unspecified class: K08.109

## 2014-03-08 HISTORY — DX: Blindness, one eye, unspecified eye: H54.40

## 2014-03-08 HISTORY — DX: Complete loss of teeth, unspecified cause, unspecified class: Z97.2

## 2014-03-08 LAB — POCT I-STAT, CHEM 8
BUN: 25 mg/dL — ABNORMAL HIGH (ref 6–23)
CHLORIDE: 107 meq/L (ref 96–112)
Calcium, Ion: 1.18 mmol/L (ref 1.12–1.23)
Creatinine, Ser: 1.9 mg/dL — ABNORMAL HIGH (ref 0.50–1.35)
Glucose, Bld: 261 mg/dL — ABNORMAL HIGH (ref 70–99)
HEMATOCRIT: 37 % — AB (ref 39.0–52.0)
HEMOGLOBIN: 12.6 g/dL — AB (ref 13.0–17.0)
POTASSIUM: 4.4 meq/L (ref 3.7–5.3)
Sodium: 137 mEq/L (ref 137–147)
TCO2: 20 mmol/L (ref 0–100)

## 2014-03-08 LAB — POCT UA - MICROALBUMIN: Microalbumin Ur, POC: 100 mg/L

## 2014-03-08 LAB — POCT GLYCOSYLATED HEMOGLOBIN (HGB A1C): HEMOGLOBIN A1C: 7.9

## 2014-03-08 SURGERY — CANCELLED PROCEDURE

## 2014-03-08 MED ORDER — HYDROCHLOROTHIAZIDE 25 MG PO TABS: 25.0000 mg | ORAL_TABLET | Freq: Every day | ORAL | Status: DC

## 2014-03-08 MED ORDER — CEFAZOLIN SODIUM-DEXTROSE 2-3 GM-% IV SOLR
INTRAVENOUS | Status: AC
  Filled 2014-03-08: qty 50

## 2014-03-08 MED ORDER — FENTANYL CITRATE 0.05 MG/ML IJ SOLN: 50.0000 ug | INTRAMUSCULAR | Status: DC | PRN

## 2014-03-08 MED ORDER — ROSUVASTATIN CALCIUM 40 MG PO TABS: 40.0000 mg | ORAL_TABLET | Freq: Every day | ORAL | Status: DC

## 2014-03-08 MED ORDER — LACTATED RINGERS IV SOLN
INTRAVENOUS | Status: DC
  Administered 2014-03-08: 07:00:00 via INTRAVENOUS

## 2014-03-08 MED ORDER — SUCCINYLCHOLINE CHLORIDE 20 MG/ML IJ SOLN
INTRAMUSCULAR | Status: AC
  Filled 2014-03-08: qty 1

## 2014-03-08 MED ORDER — LISINOPRIL 20 MG PO TABS: 20.0000 mg | ORAL_TABLET | Freq: Every day | ORAL | Status: DC

## 2014-03-08 MED ORDER — LINAGLIPTIN 5 MG PO TABS: 5.0000 mg | ORAL_TABLET | Freq: Every day | ORAL | Status: DC

## 2014-03-08 MED ORDER — MIDAZOLAM HCL 2 MG/2ML IJ SOLN
INTRAMUSCULAR | Status: AC
  Filled 2014-03-08: qty 2

## 2014-03-08 MED ORDER — MIDAZOLAM HCL 2 MG/2ML IJ SOLN: 1.0000 mg | INTRAMUSCULAR | Status: DC | PRN

## 2014-03-08 MED ORDER — AMLODIPINE BESYLATE 10 MG PO TABS: 10.0000 mg | ORAL_TABLET | Freq: Every day | ORAL | Status: DC

## 2014-03-08 MED ORDER — PROPOFOL 10 MG/ML IV BOLUS
INTRAVENOUS | Status: AC
  Filled 2014-03-08: qty 20

## 2014-03-08 MED ORDER — INSULIN GLARGINE 100 UNIT/ML ~~LOC~~ SOLN: 25.0000 [IU] | Freq: Every day | SUBCUTANEOUS | Status: DC

## 2014-03-08 MED ORDER — FENTANYL CITRATE 0.05 MG/ML IJ SOLN
INTRAMUSCULAR | Status: AC
  Filled 2014-03-08: qty 6

## 2014-03-08 MED ORDER — CEFAZOLIN SODIUM-DEXTROSE 2-3 GM-% IV SOLR: 2.0000 g | INTRAVENOUS | Status: DC

## 2014-03-08 SURGICAL SUPPLY — 28 items
APPLIER CLIP LOGIC TI 5 (MISCELLANEOUS) IMPLANT
BANDAGE ADH SHEER 1  50/CT (GAUZE/BANDAGES/DRESSINGS) IMPLANT
BENZOIN TINCTURE PRP APPL 2/3 (GAUZE/BANDAGES/DRESSINGS) IMPLANT
BLADE CLIPPER SURG (BLADE) IMPLANT
CANISTER SUCT 3000ML (MISCELLANEOUS) IMPLANT
CHLORAPREP W/TINT 26ML (MISCELLANEOUS) IMPLANT
DEVICE SECURE STRAP 25 ABSORB (INSTRUMENTS) IMPLANT
DISSECT BALLN SPACEMKR + OVL (BALLOONS)
DISSECTOR BALLN SPACEMKR + OVL (BALLOONS) IMPLANT
DISSECTOR BLUNT TIP ENDO 5MM (MISCELLANEOUS) IMPLANT
DRAPE UTILITY XL STRL (DRAPES) IMPLANT
ELECT REM PT RETURN 9FT ADLT (ELECTROSURGICAL)
ELECTRODE REM PT RTRN 9FT ADLT (ELECTROSURGICAL) IMPLANT
GLOVE SURG SIGNA 7.5 PF LTX (GLOVE) IMPLANT
GOWN STRL REUS W/ TWL LRG LVL3 (GOWN DISPOSABLE) IMPLANT
GOWN STRL REUS W/ TWL XL LVL3 (GOWN DISPOSABLE) IMPLANT
GOWN STRL REUS W/TWL LRG LVL3 (GOWN DISPOSABLE)
GOWN STRL REUS W/TWL XL LVL3 (GOWN DISPOSABLE)
NEEDLE INSUFFLATION 14GA 120MM (NEEDLE) IMPLANT
NS IRRIG 1000ML POUR BTL (IV SOLUTION) IMPLANT
PACK BASIN DAY SURGERY FS (CUSTOM PROCEDURE TRAY) IMPLANT
SET IRRIG TUBING LAPAROSCOPIC (IRRIGATION / IRRIGATOR) IMPLANT
SET TROCAR LAP APPLE-HUNT 5MM (ENDOMECHANICALS) IMPLANT
SLEEVE SCD COMPRESS KNEE MED (MISCELLANEOUS) IMPLANT
SUT MNCRL AB 4-0 PS2 18 (SUTURE) IMPLANT
TOWEL OR 17X24 6PK STRL BLUE (TOWEL DISPOSABLE) IMPLANT
TRAY FOLEY CATH 14FR (SET/KITS/TRAYS/PACK) IMPLANT
TRAY LAPAROSCOPIC (CUSTOM PROCEDURE TRAY) IMPLANT

## 2014-03-08 NOTE — Progress Notes (Signed)
Surgery cancelled per Dr Ermalene Postin due to HTN, Diabetes and renal insuffiency. Dr Ninfa Linden here and talked with patient via interpreter.

## 2014-03-08 NOTE — Progress Notes (Signed)
Subjective:    Patient ID: Craig Fisher, male    DOB: 11-26-1960, 53 y.o.   MRN: [id number]  Diabetes He presents for his follow-up diabetic visit. He has type 2 diabetes mellitus. His disease course has been fluctuating. Pertinent negatives for hypoglycemia include no confusion, dizziness or headaches. Associated symptoms include blurred vision and visual change. Pertinent negatives for diabetes include no chest pain, no foot paresthesias and no foot ulcerations. Symptoms are worsening. Diabetic complications include nephropathy. Pertinent negatives for diabetic complications include no CVA, heart disease or peripheral neuropathy. Risk factors for coronary artery disease include diabetes mellitus, dyslipidemia, hypertension and male sex. Current diabetic treatment includes insulin injections and oral agent (monotherapy). He is compliant with treatment all of the time. His breakfast blood glucose range is generally 140-180 mg/dl. An ACE inhibitor/angiotensin II receptor blocker is being taken. Eye exam is current.  Hypertension This is a chronic problem. The current episode started more than 1 year ago. The problem has been waxing and waning since onset. The problem is uncontrolled. Associated symptoms include blurred vision. Pertinent negatives include no chest pain, headaches, palpitations, peripheral edema or shortness of breath. Risk factors for coronary artery disease include diabetes mellitus, dyslipidemia and male gender. Past treatments include ACE inhibitors and calcium channel blockers. The current treatment provides mild improvement. Hypertensive end-organ damage includes kidney disease. There is no history of CAD/MI, CVA or heart failure. There is no history of sleep apnea.  Hyperlipidemia This is a chronic problem. The current episode started more than 1 year ago. The problem is uncontrolled. Recent lipid tests were reviewed and are high. Exacerbating diseases include diabetes. He has no  history of hypothyroidism. Factors aggravating his hyperlipidemia include fatty foods. Pertinent negatives include no chest pain, leg pain, myalgias or shortness of breath. Current antihyperlipidemic treatment includes statins. The current treatment provides mild improvement of lipids. Risk factors for coronary artery disease include diabetes mellitus, dyslipidemia, hypertension and male sex.   *Pt was to have hernia repair surgery today, but had blood work drawn and was told he had kidney failure and needed to follow-up with PCP.    Review of Systems  Constitutional: Negative.   HENT: Negative.   Eyes: Positive for blurred vision.  Respiratory: Negative.  Negative for shortness of breath.   Cardiovascular: Negative.  Negative for chest pain and palpitations.  Gastrointestinal: Negative.   Endocrine: Negative.   Genitourinary: Negative.   Musculoskeletal: Negative.  Negative for myalgias.  Neurological: Negative.  Negative for dizziness and headaches.  Hematological: Negative.   Psychiatric/Behavioral: Negative.  Negative for confusion.  All other systems reviewed and are negative.      Objective:   Physical Exam  Vitals reviewed. Constitutional: He is oriented to person, place, and time. He appears well-developed and well-nourished. No distress.  HENT:  Head: Normocephalic.  Right Ear: External ear normal.  Left Ear: External ear normal.  Mouth/Throat: Oropharynx is clear and moist.  Eyes: Pupils are equal, round, and reactive to light. Right eye exhibits no discharge. Left eye exhibits no discharge.  Neck: Normal range of motion. Neck supple. No thyromegaly present.  Cardiovascular: Normal rate, regular rhythm, normal heart sounds and intact distal pulses.   No murmur heard. Pulmonary/Chest: Effort normal and breath sounds normal. No respiratory distress. He has no wheezes.  Abdominal: Soft. Bowel sounds are normal. He exhibits no distension. There is no tenderness.    Musculoskeletal: Normal range of motion. He exhibits no edema and no tenderness.  Neurological: He is  alert and oriented to person, place, and time. He has normal reflexes. No cranial nerve deficit.  Skin: Skin is warm and dry. No rash noted. No erythema.  Psychiatric: He has a normal mood and affect. His behavior is normal. Judgment and thought content normal.    BP 162/76  Pulse 60  Temp(Src) 97.7 F (36.5 C) (Oral)  Ht _0  (1.6 m)  Wt 132 lb 6.4 oz (60.056 kg)  BMI 23.46 kg/m2       Assessment & Plan:  1. Essential hypertension - CMP14+EGFR - lisinopril (PRINIVIL,ZESTRIL) 20 MG tablet; Take 1 tablet (20 mg total) by mouth daily.  Dispense: 90 tablet; Refill: 2 - amLODipine (NORVASC) 10 MG tablet; Take 1 tablet (10 mg total) by mouth daily.  Dispense: 90 tablet; Refill: 3 - Ambulatory referral to Nephrology - hydrochlorothiazide (HYDRODIURIL) 25 MG tablet; Take 1 tablet (25 mg total) by mouth daily.  Dispense: 90 tablet; Refill: 3  2. Type 2 diabetes mellitus with diabetic nephropathy - POCT glycosylated hemoglobin (Hb A1C) - POCT UA - Microalbumin - CMP14+EGFR - linagliptin (TRADJENTA) 5 MG TABS tablet; Take 1 tablet (5 mg total) by mouth daily.  Dispense: 90 tablet; Refill: 3 - insulin glargine (LANTUS) 100 UNIT/ML injection; Inject 0.25 mLs (25 Units total) into the skin at bedtime.  Dispense: 10 mL; Refill: 6 - Ambulatory referral to Nephrology  3. HLD (hyperlipidemia) - CMP14+EGFR - Lipid panel - rosuvastatin (CRESTOR) 40 MG tablet; Take 1 tablet (40 mg total) by mouth daily.  Dispense: 90 tablet; Refill: 3  4. Encounter for vitamin deficiency screening - Vit D  25 hydroxy (rtn osteoporosis monitoring)  5. Prostate cancer screening - PSA, total and free  6. Elevated serum creatinine - Ambulatory referral to Nephrology   Continue all meds Labs pending Health Maintenance reviewed Diet and exercise encouraged RTO 2 weeks for blood pressure and kidney's  rechecked  Evelina Dun, FNP

## 2014-03-08 NOTE — Patient Instructions (Signed)
Diabetic Nephropathy Diabetic nephropathy is a complication of diabetes that leads to damaged kidneys. It develops slowly. The function of healthy kidneys is to filter and clean blood. Kidneys also get rid of body waste products and extra fluid. When the kidney filters are damaged, there is protein loss in the urine, a decline in kidney function, a buildup of kidney waste products and fluid, and high blood pressure. The damage progresses until the kidneys fail.  RISK FACTORS  High blood pressure (hypertension).  High blood sugar (hyperglycemia).  Family history.  Aging.  Obstruction problems affecting the kidneys, the tubes that drain the kidneys (ureters), or the bladder.  Taking certain drugs or medicines. SYMPTOMS  Symptoms may not be seen or felt for many years. You may not notice any signs of kidney failure until your kidneys have lost much of their ability to function. An early sign of damage is when small amounts of protein (albumin) leak into the urine. However, this can only be found through a urine test. Without physical symptoms, a urine test is often not performed. When the kidneys fail, you may feel one or more of the following:  Swelling of the hands and feet from the extra fluid in your body.  Constant upset stomach.  Constant fatigue. DIAGNOSIS When someone has diabetes, screening tests are done to look for any early signs of problems before symptoms develop and before damage has already been done. These tests may include:   Annual urine tests to screen for trace amounts of protein in the urine (microalbuminuria).  Urine collectionover 24 hours to measure kidney function.  Blood tests that measure kidney function. Your caregiver is aware that problems other than diabetes can damage kidneys. If screening tests show early kidney damage, but it is thought that a different problem is causing the damage, other tests may be performed. Examples of these tests include:  An  ultrasound of your kidney.  Taking a tissue sample (biopsy) from the kidney. TREATMENT The goal of treatment is to prevent or slow down damage to your kidneys. Controlling hypertension and hyperglycemia is critical. Your goal is to maintain a blood pressure below 120/80. If you have certain other medical problems, this goal may be different. Talk to your caregiver to make sure that your blood pressure goal is right for your needs. Regular testing of your blood glucose at home is important. Your goal is to have a normal blood glucose (110 or less when fasting) as often as possible.  In addition, maintaining your hemoglobin A1c level at less than 7% reduces your risk for complications, including kidney damage. Common treatments include:  Dieting by controlling what you eat as well as the portion sizes.  Exercising to control blood pressure and blood glucose.  Taking medicines.  Giving yourself insulin injections if your caregiver feels that it is necessary.  Getting early treatment for urinary tract infections.  Regularly following up with your caregiver. If your disease progresses to end-stage kidney failure, you will need dialysis or a transplant. Dialysis can be done in 1 of 2 ways:  Hemodialysis. Your blood flows from a tube in your arm through a machine. The machine filters waste and extra fluid. The clean blood flows back into your arm.  Peritoneal dialysis. Your abdomen is filled with a special fluid. The fluid collects waste products and extra fluid from your blood. The fluid is then drained from your abdomen and discarded. SEEK MEDICAL CARE IF:   You are having problems keeping your blood  glucose in the goal range.  You have swelling of the hands or feet.  You have weakness.  You have muscles spasms.  You have a constant upset stomach.  You feel tired all the time and this is not normal for you. SEEK IMMEDIATE MEDICAL CARE IF:  You have unusual dizziness or  weakness.  You have excessive sleepiness.  You have a seizure or convulsion.  You have severe, painful muscle spasms.  You have shortness of breath or trouble breathing.  You pass out or have a fainting episode.  You have chest pains. MAKE SURE YOU:  Understand these instructions.  Will watch your condition.  Will get help right away if you are not doing well or get worse. Document Released: 07/25/2007 Document Revised: 03/07/2013 Document Reviewed: 02/24/2011 [facility] Patient Information 2015 [place], [place]. This information is not intended to replace advice given to you by your health care provider. Make sure you discuss any questions you have with your health care provider.

## 2014-03-08 NOTE — Addendum Note (Signed)
Addended by: Earlene Plater on: 03/08/2014 10:25 AM   Modules accepted: Orders

## 2014-03-08 NOTE — Interval H&P Note (Signed)
History and Physical Interval Note: no change in H and P  03/08/2014 6:45 AM  Craig Fisher  has presented today for surgery, with the diagnosis of right inguinal hernia  The various methods of treatment have been discussed with the patient and family. After consideration of risks, benefits and other options for treatment, the patient has consented to  Procedure(s): LAPAROSCOPIC RIGHT INGUINAL HERNIA REPAIR POSSIBLE LEFT (Bilateral) INSERTION OF MESH (N/A) as a surgical intervention .  The patient's history has been reviewed, patient examined, no change in status, stable for surgery.  I have reviewed the patient's chart and labs.  Questions were answered to the patient's satisfaction.     [name]

## 2014-03-08 NOTE — H&P (Signed)
  On evaluation pre-op this morning, the patient's SBP is >200 and his CBG is >240.  He has not had a follow-up with his primary care physician as he was supposed to.  He apparently has been re-assigned physicians. Given these factors, it is not safe to proceed with general anesthesia.  I explained this to him through an interpreter. He understands and will call his new primary care physician ASAP. His surgery will have to be rescheduled.

## 2014-03-09 LAB — CMP14+EGFR
ALBUMIN: 3.6 g/dL (ref 3.5–5.5)
ALT: 21 IU/L (ref 0–44)
AST: 20 IU/L (ref 0–40)
Albumin/Globulin Ratio: 1.4 (ref 1.1–2.5)
Alkaline Phosphatase: 70 IU/L (ref 39–117)
BUN/Creatinine Ratio: 13 (ref 9–20)
BUN: 24 mg/dL (ref 6–24)
CO2: 23 mmol/L (ref 18–29)
Calcium: 8.8 mg/dL (ref 8.7–10.2)
Chloride: 102 mmol/L (ref 97–108)
Creatinine, Ser: 1.89 mg/dL — ABNORMAL HIGH (ref 0.76–1.27)
GFR calc Af Amer: 46 mL/min/{1.73_m2} — ABNORMAL LOW (ref >59)
GFR calc non Af Amer: 40 mL/min/{1.73_m2} — ABNORMAL LOW (ref >59)
GLUCOSE: 230 mg/dL — AB (ref 65–99)
Globulin, Total: 2.5 g/dL (ref 1.5–4.5)
POTASSIUM: 5.4 mmol/L — AB (ref 3.5–5.2)
Sodium: 137 mmol/L (ref 134–144)
Total Bilirubin: 0.3 mg/dL (ref 0.0–1.2)
Total Protein: 6.1 g/dL (ref 6.0–8.5)

## 2014-03-09 LAB — LIPID PANEL
Chol/HDL Ratio: 4.8 ratio units (ref 0.0–5.0)
Cholesterol, Total: 229 mg/dL — ABNORMAL HIGH (ref 100–199)
HDL: 48 mg/dL (ref >39)
LDL CALC: 143 mg/dL — AB (ref 0–99)
TRIGLYCERIDES: 190 mg/dL — AB (ref 0–149)
VLDL Cholesterol Cal: 38 mg/dL (ref 5–40)

## 2014-03-09 LAB — PSA, TOTAL AND FREE
PSA FREE: 0.28 ng/mL
PSA, Free Pct: 35 %
PSA: 0.8 ng/mL (ref 0.0–4.0)

## 2014-03-09 LAB — MICROALBUMIN, URINE: Microalbumin, Urine: 2708 ug/mL — ABNORMAL HIGH (ref 0.0–17.0)

## 2014-03-09 LAB — VITAMIN D 25 HYDROXY (VIT D DEFICIENCY, FRACTURES): VIT D 25 HYDROXY: 18.5 ng/mL — AB (ref 30.0–100.0)

## 2014-03-11 ENCOUNTER — Other Ambulatory Visit: Payer: Self-pay | Admitting: Family

## 2014-03-11 MED ORDER — EZETIMIBE 10 MG PO TABS: 10.0000 mg | ORAL_TABLET | Freq: Every day | ORAL | Status: DC

## 2014-03-11 MED ORDER — VITAMIN D (ERGOCALCIFEROL) 1.25 MG (50000 UNIT) PO CAPS: 50000.0000 [IU] | ORAL_CAPSULE | ORAL | Status: DC

## 2014-03-12 ENCOUNTER — Telehealth: Payer: Self-pay | Admitting: *Deleted

## 2014-03-29 ENCOUNTER — Encounter: Payer: Self-pay | Admitting: Family

## 2014-03-29 ENCOUNTER — Ambulatory Visit (INDEPENDENT_AMBULATORY_CARE_PROVIDER_SITE_OTHER): Payer: 59 | Admitting: Family

## 2014-03-29 VITALS — BP 163/78 | HR 71 | Temp 97.6°F | Ht 63.0 in | Wt 137.0 lb

## 2014-03-29 DIAGNOSIS — E1129 Type 2 diabetes mellitus with other diabetic kidney complication: Secondary | ICD-10-CM

## 2014-03-29 DIAGNOSIS — E1121 Type 2 diabetes mellitus with diabetic nephropathy: Secondary | ICD-10-CM

## 2014-03-29 DIAGNOSIS — N058 Unspecified nephritic syndrome with other morphologic changes: Secondary | ICD-10-CM

## 2014-03-29 DIAGNOSIS — I10 Essential (primary) hypertension: Secondary | ICD-10-CM

## 2014-03-29 MED ORDER — LISINOPRIL 40 MG PO TABS: 40.0000 mg | ORAL_TABLET | Freq: Every day | ORAL | Status: DC

## 2014-03-29 MED ORDER — EZETIMIBE 10 MG PO TABS: 10.0000 mg | ORAL_TABLET | Freq: Every day | ORAL | Status: DC

## 2014-03-29 MED ORDER — INSULIN GLARGINE 100 UNIT/ML ~~LOC~~ SOLN: 25.0000 [IU] | Freq: Every day | SUBCUTANEOUS | Status: DC

## 2014-03-29 NOTE — Progress Notes (Signed)
   Subjective:    Patient ID: Craig Fisher, male    DOB: Aug 08, 1960, 53 y.o.   MRN: [id number]  Hypertension   Pt presents to office for follow-up on hypertension. Pt is currently taking amlodipine 10 mg daily, hydrochlorothiazide 25 mg daily,and  lisinopril 20 mg daily. Pt denies any pain, SOB, palpations, or edema at this time.    Review of Systems  Constitutional: Negative.   HENT: Negative.   Respiratory: Negative.   Cardiovascular: Negative.   Gastrointestinal: Negative.   Endocrine: Negative.   Genitourinary: Negative.   Musculoskeletal: Negative.   Neurological: Negative.   Hematological: Negative.   Psychiatric/Behavioral: Negative.   All other systems reviewed and are negative.      Objective:   Physical Exam  Vitals reviewed. Constitutional: He is oriented to person, place, and time. He appears well-developed and well-nourished. No distress.  Cardiovascular: Normal rate, regular rhythm, normal heart sounds and intact distal pulses.   No murmur heard. Pulmonary/Chest: Effort normal and breath sounds normal. No respiratory distress. He has no wheezes.  Abdominal: Soft. Bowel sounds are normal. He exhibits no distension. There is no tenderness.  Musculoskeletal: Normal range of motion. He exhibits no edema and no tenderness.  Neurological: He is alert and oriented to person, place, and time. He has normal reflexes. No cranial nerve deficit.  Skin: Skin is warm and dry. No rash noted. No erythema.  Psychiatric: He has a normal mood and affect. His behavior is normal. Judgment and thought content normal.    BP 163/78  Pulse 71  Temp(Src) 97.6 F (36.4 C) (Oral)  Ht 5\' 3"  (1.6 m)  Wt 137 lb (62.143 kg)  BMI 24.27 kg/m2       Assessment & Plan:  1. Essential hypertension -Pt's lisinopril increased to 40 mg daily today Daily blood pressure log given with instructions on how to fill out and told to bring to next visit -Dash diet information given -Exercise  encouraged - Stress Management  -Continue current meds -RTO in 2 weeks   Evelina Dun, FNP

## 2014-03-29 NOTE — Patient Instructions (Signed)
Plan de alimentacin DASH (DASH Eating Plan) DASH es la sigla en ingls de "Enfoques Alimentarios para Detener la Hipertensin". El plan de alimentacin DASH ha demostrado bajar la presin arterial elevada (hipertensin). Los beneficios adicionales para la salud pueden incluir la disminucin del riesgo de diabetes mellitus tipo2, enfermedades cardacas e ictus. Este plan tambin puede ayudar a Horticulturist, commercial. QU DEBO SABER ACERCA DEL PLAN DE ALIMENTACIN DASH? Para el plan de alimentacin DASH, seguir las siguientes pautas generales:  Elija los alimentos con un valor porcentual diario de sodio de menos del 5% (segn figura en la etiqueta del alimento).  Use hierbas o aderezos sin sal, en lugar de sal de mesa o sal marina.  Consulte al mdico o farmacutico antes de usar sustitutos de la sal.  Coma productos con bajo contenido de sodio, cuya etiqueta suele decir "bajo contenido de sodio" o "sin agregado de sal".  Coma alimentos frescos.  Coma ms verduras, frutas y productos lcteos con bajo contenido de [place].  Elija los cereales integrales. Busque la palabra "integral" en Equities trader de la lista de ingredientes.  Elija el pescado y el pollo o el pavo sin piel ms a menudo que las carnes rojas. Limite el consumo de pescado, carne de ave y carne a 6onzas (170g) por Training and development officer.  Limite el consumo de dulces, postres, azcares y bebidas azucaradas.  Elija las grasas saludables para el corazn.  Limite el consumo de queso a 1onza (28g) por Training and development officer.  Consuma ms comida casera y menos de restaurante, de buf y comida rpida.  Limite el consumo de alimentos fritos.  Cocine los alimentos utilizando mtodos que no sean la fritura.  Limite las verduras enlatadas. Si las consume, enjuguelas bien para disminuir el sodio.  Cuando coma en un restaurante, pida que preparen su comida con menos sal o, en lo posible, sin nada de sal. QU ALIMENTOS PUEDO COMER? Pida ayuda a un nutricionista para  conocer las necesidades calricas individuales. Cereales Pan de salvado o integral. Arroz integral. Pastas de salvado o integrales. Quinua, trigo burgol y cereales integrales. Cereales con bajo contenido de sodio. Tortillas de harina de maz o de salvado. Pan de maz integral. Galletas saladas integrales. Galletas con bajo contenido de [place]. Vegetales Verduras frescas o congeladas (crudas, al vapor, asadas o grilladas). Jugos de tomate y verduras con contenido bajo o reducido de sodio. Pasta y salsa de tomate con contenido bajo o [place]. Verduras enlatadas con bajo contenido de sodio o reducido de sodio.  Lambert Mody Lambert Mody frescas, en conserva (en su jugo natural) o frutas congeladas. Carnes y otros productos con protenas Carne de res molida (al 85% o ms [place]), carne de res de animales alimentados con pastos o carne de res sin la grasa. Pollo o pavo sin piel. Carne de pollo o de [place]. Cerdo sin la grasa. Todos los pescados y frutos de mar. Huevos. Porotos, guisantes o lentejas secos. Frutos secos y semillas sin sal. Frijoles enlatados sin sal. Lcteos Productos lcteos con bajo contenido de grasas, como [place] o al 1%, quesos reducidos en grasas o al 2%, ricota con bajo contenido de grasas o [employer], o yogur natural con bajo contenido de [place]. Quesos con contenido bajo o reducido de sodio. Grasas y Naval architect en barra que no contengan grasas trans. [place] y alios para ensaladas livianos o reducidos en grasas (reducidos en sodio). [place]. Aceites de crtamo, oliva o canola. [place] natural de man o almendra. Otros [place] de maz y pretzels sin sal.  Los artculos mencionados arriba pueden no ser [employer] de las bebidas o los alimentos recomendados. Comunquese con el nutricionista para conocer ms opciones. QU ALIMENTOS NO SE RECOMIENDAN? Cereales Pan blanco. Pastas blancas. Arroz blanco. Pan de maz refinado. Bagels y  croissants. Galletas saladas que contengan grasas trans. Vegetales Vegetales con crema o fritos. Verduras en [place]. Verduras enlatadas comunes. Pasta y salsa de tomate en lata comunes. Jugos comunes de tomate y de verduras. Lambert Mody Frutas secas. Fruta enlatada en almbar liviano o espeso. Jugo de frutas. Carnes y otros productos con protenas [place] de carne con Lobbyist. Costillas, alas de pollo, tocineta, salchicha, mortadela, salame, chinchulines, tocino, perros calientes, salchichas alemanas y embutidos envasados. Frutos secos y semillas con sal. Frijoles con sal en lata. Lcteos [place] entera o al 2%, crema, mezcla de [place] y crema, y queso crema. Yogur entero o endulzado. Quesos o queso azul con alto contenido de Physicist, medical. [place] no lcteas y coberturas batidas. Quesos procesados, quesos para untar o cuajadas. Condimentos Sal de cebolla y ajo, sal condimentada, sal de mesa y sal marina. Salsas en lata y envasadas. Salsa [place]. Salsa trtara. Salsa barbacoa. Salsa teriyaki. Salsa de soja, incluso la que tiene contenido reducido de [place]. Salsa de carne. Salsa de pescado. Salsa de [place]. Salsa rosada. Rbano picante. Ketchup y mostaza. Saborizantes y tiernizantes para carne. Caldo en cubitos. Salsa picante. Salsa tabasco. Adobos. Aderezos para tacos. Salsas. Grasas y aceites [place], [place] en barra, Margaret de [place], [place], [place] clarificada y Wendee Copp de tocino. Aceites de coco, de palmiste o de palma. Aderezos comunes para ensalada. Otros Pickles y [place]. Palomitas de maz y pretzels con sal. Los artculos mencionados arriba pueden no ser [employer] de las bebidas y los alimentos que se Higher education careers adviser. Comunquese con el nutricionista para obtener ms informacin. DNDE Dolan Amen MS INFORMACIN? [place], del Pulmn y de la Sangre (National Heart, Lung, and [place]):  [email or web address].[email or web address] Document Released: 06/24/2011 Document Revised: 11/19/2013 [facility] Patient Information 2015 [place], [place]. This information is not intended to replace advice given to you by your health care provider. Make sure you discuss any questions you have with your health care provider. Hipertensin (Hypertension) La hipertensin, conocida comnmente como presin arterial alta, se produce cuando la sangre bombea en las arterias con mucha fuerza. Las arterias son los vasos sanguneos que transportan la sangre desde el corazn hacia todas las partes del cuerpo. Una lectura de la presin arterial consiste en un nmero ms alto sobre un nmero ms bajo, por ejemplo, 110/72. El nmero ms alto (presin sistlica) corresponde a la presin interna de las arterias cuando el corazn [place]. El nmero ms bajo (presin diastlica) corresponde a la presin interna de las arterias cuando el corazn se relaja. En condiciones ideales, la presin arterial debe ser inferior a 120/80. La hipertensin fuerza al corazn a trabajar ms para Herbalist. Las arterias pueden estrecharse o ponerse rgidas. La hipertensin conlleva el riesgo de enfermedad cardaca, ictus y otros problemas.  [place] de riesgo de hipertensin son controlables, pero otros no lo son.  [employer] factores de riesgo que usted no puede Chief Technology Officer, se incluyen:   Manufacturing systems engineer. El riesgo es mayor para las Retail banker.  La edad. Los riesgos aumentan con la edad.  El sexo. Antes de los 45aos, los hombres corren ms [employer]. Despus de los 65aos, las mujeres corren ms [employer]. [employer] factores de [place]  que usted IT consultant, se incluyen:  No hacer la cantidad suficiente de actividad fsica o ejercicio.  Tener sobrepeso.  Consumir mucha grasa, azcar, caloras o sal en la dieta.  Beber alcohol en exceso. SIGNOS Y  SNTOMAS Por lo general, la hipertensin no causa signos o sntomas. La hipertensin demasiado alta (crisis hipertensiva) puede causar dolor de cabeza, ansiedad, falta de aire y hemorragia nasal. DIAGNSTICO  Para detectar si usted tiene hipertensin, el mdico le medir la presin arterial mientras est sentado, con el brazo levantado a la altura del corazn. Debe medirla al [facility] veces en el mismo brazo. Determinadas condiciones pueden causar una diferencia de presin arterial entre el brazo izquierdo y Insurance underwriter. El hecho de tener una sola lectura de la presin arterial ms alta que lo normal no significa que Stage manager. En el caso de tener una lectura de la presin arterial con un valor alto, pdale al mdico que la verifique nuevamente. [place] hipertensin arterial incluye hacer cambios en el estilo de vida y, posiblemente, tomar medicamentos. Un estilo de vida saludable puede ayudar a bajar la presin arterial alta. Quiz deba cambiar algunos hbitos. Los Levi Strauss en el estilo de vida pueden incluir:  Seguir la dieta DASH. Esta dieta tiene un alto contenido de frutas, verduras y Psychologist, prison and probation services. Incluye poca cantidad de sal, carnes rojas y azcares agregados.  Hacer al menos 2horas de actividad fsica enrgica todas las semanas.  Perder peso, si es necesario.  No fumar.  Limitar el consumo de bebidas alcohlicas.  Aprender formas de reducir el estrs. Si los cambios en el estilo de vida no son suficientes para Child psychotherapist la presin arterial, el mdico puede recetarle medicamentos. Quiz necesite tomar ms de uno. Trabaje en conjunto con su mdico para comprender los riesgos y los beneficios. INSTRUCCIONES PARA EL CUIDADO EN EL HOGAR  Haga que le midan de nuevo la presin arterial segn las indicaciones del [place] los medicamentos solamente como se lo haya indicado el mdico. Siga cuidadosamente las indicaciones. Los  medicamentos para la presin arterial deben tomarse segn las indicaciones. Los medicamentos pierden eficacia al omitir las dosis. El hecho de omitir las dosis tambin [place] el riesgo de otros problemas.  No fume.  Contrlese la presin arterial en su casa segn las indicaciones del mdico. SOLICITE ATENCIN MDICA SI:   Piensa que tiene una reaccin alrgica a los medicamentos.  Tiene mareos o dolores de cabeza con Scientist, research (physical sciences).  Tiene hinchazn en los tobillos.  Tiene problemas de visin. SOLICITE ATENCIN MDICA DE INMEDIATO SI:  Siente un dolor de cabeza intenso o confusin.  Siente debilidad inusual, adormecimiento o que Geneticist, molecular.  Siente dolor intenso en el pecho o en el abdomen.  Vomita repetidas veces.  Tiene dificultad para respirar. ASEGRESE DE QUE:   Comprende estas instrucciones.  Controlar su afeccin.  Recibir ayuda de inmediato si no mejora o si empeora. Document Released: 07/05/2005 Document Revised: 11/19/2013 [facility] Patient Information 2015 [place], [place]. This information is not intended to replace advice given to you by your health care provider. Make sure you discuss any questions you have with your health care provider.

## 2014-04-04 ENCOUNTER — Encounter: Payer: Self-pay | Admitting: Pharmacist

## 2014-04-04 ENCOUNTER — Ambulatory Visit (INDEPENDENT_AMBULATORY_CARE_PROVIDER_SITE_OTHER): Payer: 59 | Admitting: Pharmacist

## 2014-04-04 VITALS — BP 130/78 | HR 77 | Ht 63.0 in | Wt 136.0 lb

## 2014-04-04 DIAGNOSIS — E1129 Type 2 diabetes mellitus with other diabetic kidney complication: Secondary | ICD-10-CM

## 2014-04-04 DIAGNOSIS — I10 Essential (primary) hypertension: Secondary | ICD-10-CM

## 2014-04-04 DIAGNOSIS — E1121 Type 2 diabetes mellitus with diabetic nephropathy: Secondary | ICD-10-CM

## 2014-04-04 DIAGNOSIS — N058 Unspecified nephritic syndrome with other morphologic changes: Secondary | ICD-10-CM

## 2014-04-04 DIAGNOSIS — E785 Hyperlipidemia, unspecified: Secondary | ICD-10-CM

## 2014-04-04 LAB — GLUCOSE, POCT (MANUAL RESULT ENTRY): POC Glucose: 123 mg/dl — AB (ref 70–99)

## 2014-04-04 MED ORDER — "PEN NEEDLES 3/16"" 31G X 5 MM MISC": Status: DC

## 2014-04-04 MED ORDER — INSULIN GLARGINE 100 UNIT/ML SOLOSTAR PEN: 25.0000 [IU] | PEN_INJECTOR | Freq: Every day | SUBCUTANEOUS | Status: DC

## 2014-04-04 NOTE — Patient Instructions (Signed)
Diabetes and Standards of Medical Care  Diabetes is complicated. You may find that your diabetes team includes a dietitian, nurse, diabetes educator, eye doctor, and more. To help everyone know what is going on and to help you get the care you deserve, the following schedule of care was developed to help keep you on track. Below are the tests, exams, vaccines, medicines, education, and plans you will need.  Blood Glucose Goals Prior to meals = 80 - 130 Within 2 hours of the start of a meal = less than 180  HbA1c test (goal is less than 7.0% - your last value was 7.9%) This test shows how well you have controlled your glucose over the past 2 3 months. It is used to see if your diabetes management plan needs to be adjusted.   It is performed at least 2 times a year if you are meeting treatment goals.  It is performed 4 times a year if therapy has changed or if you are not meeting treatment goals.   Blood pressure test  This test is performed at every routine medical visit. The goal is less than 140/90 mmHg for most people, but 130/80 mmHg in some cases. Ask your health care provider about your goal. Dental exam  Follow up with the dentist regularly. Eye exam  If you are diagnosed with type 1 diabetes as a child, get an exam upon reaching the age of 89 years or older and have had diabetes for 3 5 years. Yearly eye exams are recommended after that initial eye exam.  If you are diagnosed with type 1 diabetes as an adult, get an exam within 5 years of diagnosis and then yearly.  If you are diagnosed with type 2 diabetes, get an exam as soon as possible after the diagnosis and then yearly. Foot care exam  Visual foot exams are performed at every routine medical visit. The exams check for cuts, injuries, or other problems with the feet.  A comprehensive foot exam should be done yearly. This includes visual inspection as well as assessing foot pulses and testing for loss of sensation.  Check  your feet nightly for cuts, injuries, or other problems with your feet. Tell your health care provider if anything is not healing. Kidney function test (urine microalbumin)  This test is performed once a year.  Type 1 diabetes: The first test is performed 5 years after diagnosis.  Type 2 diabetes: The first test is performed at the time of diagnosis.  A serum creatinine and estimated glomerular filtration rate (eGFR) test is done once a year to assess the level of chronic kidney disease (CKD), if present. Lipid profile (cholesterol, HDL, LDL, triglycerides)  Performed every 5 years for most people.  The goal for LDL is less than 100 mg/dL. If you are at high risk, the goal is less than 70 mg/dL.  The goal for HDL is 40 mg/dL 50 mg/dL for men and 50 mg/dL 60 mg/dL for women. An HDL cholesterol of 60 mg/dL or higher gives some protection against heart disease.  The goal for triglycerides is less than 150 mg/dL. Influenza vaccine, pneumococcal vaccine, and hepatitis B vaccine  The influenza vaccine is recommended yearly.  The pneumococcal vaccine is generally given once in a lifetime. However, there are some instances when another vaccination is recommended. Check with your health care provider.  The hepatitis B vaccine is also recommended for adults with diabetes. Diabetes self-management education  Education is recommended at diagnosis and ongoing  as needed. Treatment plan  Your treatment plan is reviewed at every medical visit. Document Released: 05/02/2009 Document Revised: 03/07/2013 Document Reviewed: 12/05/2012 [facility] Patient Information 2014 [place].

## 2014-04-04 NOTE — Progress Notes (Signed)
Diabetes Follow-Up Visit Chief Complaint:   Chief Complaint  Patient presents with  . Diabetes  . Hypertension  . Hyperlipidemia     Filed Vitals:   04/04/14 0928  BP: 130/78  Pulse: 77     HPI: Mr. Craig Fisher is referred by Evelina Dun, NP to evaluate mixed dyslipidemia and diabetes.   Patient's last labs show Tg of 190 - which is better compared to 6 months ago but LDL was elevated on last labs .  He is taking Crestor 40mg  daily and just started Zetia 10mg  daily.  HTN - patient's BP is at goal today.  He recently started HCTZ 25mg  daily and Lisniopril was increased to 40mg  daily  DM worsening control - last A1c was 7.9% but April 2015 was 6.5%.  Patient states that he was out of medication prior to labs for about 1-2 weeks.  When asked why he stated that he was just anxious about up coming hernia surgery and just forgot to get filled.  Current Diabetes Medications:  Lantus 25 units Qhs and Tradgenta 5mg  daily  Home BG Monitoring:  Checking 1 times a day. Patient did not bring in glucometer to office today.  Low fat/carbohydrate diet?  Yes Nicotine Abuse?  No Medication Compliance?  No Exercise?  No Alcohol Abuse?  No   Exam Edema:  negative   BMI:  Body mass index is 24.1 kg/(m^2).   Weight changes:  stable General Appearance:  alert, oriented, no acute distress and well nourished Mood/Affect:  normal **patient has interrupter Elberta Fortis with him today**   Lab Results  Component Value Date   HGBA1C 7.9 03/08/2014    Lab Results  Component Value Date   [id number] 03/09/2014    LDL = 143 Tg = 190 Total Cholesterol = 229 HDL = 48   Assessment: 1.  Diabetes. Increasing a1c due to non compliance with medications 2.  Blood Pressure.  At goal today 3.  Lipids.  Tg better but LDL not at goal - has recently add Zetia to regimen 4.  Non Compliance  Recommendations: 1.  Medication recommendations at this time are as follows:    No medication changes today but I  did discuss with patient importance of taking all prescribed medication as prescribed  Patient did ask to change from lantus vials to pens and I gave him Rx for Lantus Solostar and pen needles. 2.  Reviewed HBG goals:  Fasting 80-130 and 1-2 hour post prandial <180.  Patient is instructed to check BG 1 times per day.    3.  BP goal < 140/85. 4.  LDL goal of < 100, HDL > 40 and TG < 150. 5.  Eye Exam yearly and Dental Exam every 6 months. 6.  Dietary recommendations:  Reviewed food that increase Tg and BG  7.   Orders Placed This Encounter  Procedures  . POCT glucose (manual entry)   9.  Return to clinic in November to recheck labs   Time spent counseling patient:  30 minutes  Referring provider: Burnis Medin, PharmD, CPP, CDE

## 2014-04-22 ENCOUNTER — Ambulatory Visit (INDEPENDENT_AMBULATORY_CARE_PROVIDER_SITE_OTHER): Payer: 59 | Admitting: Family

## 2014-04-22 ENCOUNTER — Encounter: Payer: Self-pay | Admitting: Family

## 2014-04-22 VITALS — BP 110/58 | HR 64 | Temp 98.2°F | Ht 63.0 in | Wt 137.0 lb

## 2014-04-22 DIAGNOSIS — I10 Essential (primary) hypertension: Secondary | ICD-10-CM

## 2014-04-22 DIAGNOSIS — Z23 Encounter for immunization: Secondary | ICD-10-CM

## 2014-04-22 NOTE — Progress Notes (Signed)
   Subjective:    Patient ID: Craig Fisher, male    DOB: 07-01-1961, 53 y.o.   MRN: [id number]  Hypertension   Pt presents to the office for follow-up on hypertension. Pt's blood pressure is at goal today. Pt denies any pain, headache, palpitations, SOB, or edema    Review of Systems  Constitutional: Negative.   HENT: Negative.   Respiratory: Negative.   Cardiovascular: Negative.   Gastrointestinal: Negative.   Endocrine: Negative.   Genitourinary: Negative.   Musculoskeletal: Negative.   Neurological: Negative.   Hematological: Negative.   Psychiatric/Behavioral: Negative.   All other systems reviewed and are negative.      Objective:   Physical Exam  Vitals reviewed. Constitutional: He is oriented to person, place, and time. He appears well-developed and well-nourished. No distress.  HENT:  Head: Normocephalic.  Right Ear: External ear normal.  Left Ear: External ear normal.  Nose: Nose normal.  Mouth/Throat: Oropharynx is clear and moist.  Eyes: Pupils are equal, round, and reactive to light. Right eye exhibits no discharge. Left eye exhibits no discharge.  Neck: Normal range of motion. Neck supple. No thyromegaly present.  Cardiovascular: Normal rate, regular rhythm, normal heart sounds and intact distal pulses.   No murmur heard. Pulmonary/Chest: Effort normal and breath sounds normal. No respiratory distress. He has no wheezes.  Abdominal: Soft. Bowel sounds are normal. He exhibits no distension. There is no tenderness.  Musculoskeletal: Normal range of motion. He exhibits no edema and no tenderness.  Neurological: He is alert and oriented to person, place, and time. He has normal reflexes. No cranial nerve deficit.  Skin: Skin is warm and dry. No rash noted. No erythema.  Psychiatric: He has a normal mood and affect. His behavior is normal. Judgment and thought content normal.    Blood pressure 110/58, pulse 64, temperature 98.2 F (36.8 C), temperature source  Oral, height 5\' 3"  (1.6 m), weight 137 lb (62.143 kg).       Assessment & Plan:  1. Encounter for immunization  2. Essential hypertension -Dash diet information discussed -Exercise encouraged - Stress Management  -Continue current meds -RTO in 6 months for chronic follow-up  Evelina Dun, FNP

## 2014-04-22 NOTE — Patient Instructions (Addendum)
Hipertensin (Hypertension) La hipertensin, conocida comnmente como presin arterial alta, se produce cuando la sangre bombea en las arterias con mucha fuerza. Las arterias son los vasos sanguneos que transportan la sangre desde el corazn hacia todas las partes del cuerpo. Una lectura de la presin arterial consiste en un nmero ms alto sobre un nmero ms bajo, por ejemplo, 110/72. El nmero ms alto (presin sistlica) corresponde a la presin interna de las arterias cuando el corazn [place]. El nmero ms bajo (presin diastlica) corresponde a la presin interna de las arterias cuando el corazn se relaja. En condiciones ideales, la presin arterial debe ser inferior a 120/80. La hipertensin fuerza al corazn a trabajar ms para Herbalist. Las arterias pueden estrecharse o ponerse rgidas. La hipertensin conlleva el riesgo de enfermedad cardaca, ictus y otros problemas.  [place] de riesgo de hipertensin son controlables, pero otros no lo son.  [employer] factores de riesgo que usted no puede Chief Technology Officer, se incluyen:   Manufacturing systems engineer. El riesgo es mayor para las Retail banker.  La edad. Los riesgos aumentan con la edad.  El sexo. Antes de los 45aos, los hombres corren ms [employer]. Despus de los 65aos, las mujeres corren ms [employer]. Entre los factores de riesgo que usted puede Chief Technology Officer, se incluyen:  No hacer la cantidad suficiente de actividad fsica o ejercicio.  Tener sobrepeso.  Consumir mucha grasa, azcar, caloras o sal en la dieta.  Beber alcohol en exceso. SIGNOS Y SNTOMAS Por lo general, la hipertensin no causa signos o sntomas. La hipertensin demasiado alta (crisis hipertensiva) puede causar dolor de cabeza, ansiedad, falta de aire y hemorragia nasal. DIAGNSTICO  Para detectar si usted tiene hipertensin, el mdico le medir la presin arterial mientras est sentado, con el brazo  levantado a la altura del corazn. Debe medirla al [facility] veces en el mismo brazo. Determinadas condiciones pueden causar una diferencia de presin arterial entre el brazo izquierdo y Insurance underwriter. El hecho de tener una sola lectura de la presin arterial ms alta que lo normal no significa que Stage manager. En el caso de tener una lectura de la presin arterial con un valor alto, pdale al mdico que la verifique nuevamente. [place] hipertensin arterial incluye hacer cambios en el estilo de vida y, posiblemente, tomar medicamentos. Un estilo de vida saludable puede ayudar a bajar la presin arterial alta. Quiz deba cambiar algunos hbitos. Los [employer] en el estilo de vida pueden incluir:  Seguir la dieta DASH. Esta dieta tiene un alto contenido de frutas, verduras y Psychologist, prison and probation services. Incluye poca cantidad de sal, carnes rojas y azcares agregados.  Hacer al menos 2horas de actividad fsica enrgica todas las semanas.  Perder peso, si es necesario.  No fumar.  Limitar el consumo de bebidas alcohlicas.  Aprender formas de reducir el estrs. Si los cambios en el estilo de vida no son suficientes para Child psychotherapist la presin arterial, el mdico puede recetarle medicamentos. Quiz necesite tomar ms de uno. Trabaje en conjunto con su mdico para comprender los riesgos y los beneficios. INSTRUCCIONES PARA EL CUIDADO EN EL HOGAR  Haga que le midan de nuevo la presin arterial segn las indicaciones del [place] los medicamentos solamente como se lo haya indicado el mdico. Siga cuidadosamente las indicaciones. Los medicamentos para la presin arterial deben tomarse segn las indicaciones. Los medicamentos pierden eficacia al omitir las dosis. El hecho de omitir  las dosis tambin aumenta el riesgo de otros [place].  No fume.  Contrlese la presin arterial en su casa segn las indicaciones del mdico. SOLICITE ATENCIN MDICA SI:   Piensa  que tiene una reaccin alrgica a los medicamentos.  Tiene mareos o dolores de cabeza con Scientist, research (physical sciences).  Tiene hinchazn en los tobillos.  Tiene problemas de visin. SOLICITE ATENCIN MDICA DE INMEDIATO SI:  Siente un dolor de cabeza intenso o confusin.  Siente debilidad inusual, adormecimiento o que Geneticist, molecular.  Siente dolor intenso en el pecho o en el abdomen.  Vomita repetidas veces.  Tiene dificultad para respirar. ASEGRESE DE QUE:   Comprende estas instrucciones.  Controlar su afeccin.  Recibir ayuda de inmediato si no mejora o si empeora. Document Released: 07/05/2005 Document Revised: 11/19/2013 [facility] Patient Information 2015 [place], [place]. This information is not intended to replace advice given to you by your health care provider. Make sure you discuss any questions you have with your health care provider.

## 2014-05-06 DIAGNOSIS — E118 Type 2 diabetes mellitus with unspecified complications: Principal | ICD-10-CM

## 2014-05-06 DIAGNOSIS — E1165 Type 2 diabetes mellitus with hyperglycemia: Secondary | ICD-10-CM | POA: Insufficient documentation

## 2014-05-06 DIAGNOSIS — E1121 Type 2 diabetes mellitus with diabetic nephropathy: Secondary | ICD-10-CM | POA: Insufficient documentation

## 2014-05-06 DIAGNOSIS — IMO0002 Reserved for concepts with insufficient information to code with codable children: Secondary | ICD-10-CM | POA: Insufficient documentation

## 2014-06-11 ENCOUNTER — Other Ambulatory Visit: Payer: Self-pay | Admitting: *Deleted

## 2014-06-11 MED ORDER — "PEN NEEDLES 3/16"" 31G X 5 MM MISC": Status: DC

## 2014-06-11 MED ORDER — INSULIN GLARGINE 100 UNIT/ML SOLOSTAR PEN: 25.0000 [IU] | PEN_INJECTOR | Freq: Every day | SUBCUTANEOUS | Status: DC

## 2014-06-11 NOTE — Telephone Encounter (Signed)
Last AIC 7.9 on 8/15. Pt of [employer].

## 2014-06-11 NOTE — Telephone Encounter (Signed)
no more refills without being seen  

## 2014-06-19 ENCOUNTER — Other Ambulatory Visit: Payer: Self-pay | Admitting: Pharmacist

## 2014-06-19 MED ORDER — INSULIN SYRINGES (DISPOSABLE) U-100 0.5 ML MISC: Status: DC

## 2014-06-19 MED ORDER — INSULIN GLARGINE 100 UNIT/ML ~~LOC~~ SOLN: 25.0000 [IU] | Freq: Every day | SUBCUTANEOUS | Status: DC

## 2014-06-21 ENCOUNTER — Ambulatory Visit (INDEPENDENT_AMBULATORY_CARE_PROVIDER_SITE_OTHER): Payer: Commercial Indemnity | Admitting: Pharmacist

## 2014-06-21 ENCOUNTER — Encounter: Payer: Self-pay | Admitting: Pharmacist

## 2014-06-21 ENCOUNTER — Other Ambulatory Visit: Payer: Self-pay

## 2014-06-21 VITALS — BP 122/62 | HR 68 | Ht 63.0 in | Wt 132.0 lb

## 2014-06-21 DIAGNOSIS — I10 Essential (primary) hypertension: Secondary | ICD-10-CM

## 2014-06-21 DIAGNOSIS — E785 Hyperlipidemia, unspecified: Secondary | ICD-10-CM

## 2014-06-21 DIAGNOSIS — E1121 Type 2 diabetes mellitus with diabetic nephropathy: Secondary | ICD-10-CM

## 2014-06-21 DIAGNOSIS — R7989 Other specified abnormal findings of blood chemistry: Secondary | ICD-10-CM

## 2014-06-21 DIAGNOSIS — K4090 Unilateral inguinal hernia, without obstruction or gangrene, not specified as recurrent: Secondary | ICD-10-CM

## 2014-06-21 DIAGNOSIS — E1169 Type 2 diabetes mellitus with other specified complication: Secondary | ICD-10-CM

## 2014-06-21 LAB — POCT GLYCOSYLATED HEMOGLOBIN (HGB A1C): Hemoglobin A1C: 7.2

## 2014-06-21 NOTE — Progress Notes (Signed)
Diabetes Follow-Up Visit Chief Complaint:   Chief Complaint  Patient presents with  . Diabetes     Filed Vitals:   06/21/14 0914  BP: 122/62  Pulse: 68     HPI: Mr. Craig Fisher is referred by Evelina Dun, NP to evaluate mixed dyslipidemia and diabetes.    Diabetes- patient is suppose to take Lantus 25 units once daily though he reports that he has not had in about 2 weeks because it was not covered by his insurance.  I contacted [facility] and found out that it was filled 3 days ago but copay was $35.  Patient states that he can purchase insulin for that price and will pick up today. Patient also takes Tradgenta 37m 1 tablet daily and has been taking this regularly.  Home BG Monitoring:  Checking 1 times a day. Patient did not bring in glucometer to office today. Reports recent BG was been 180 to low 200's while out of insulin was 120 to 150 prior to running out of insulin.  Low fat/carbohydrate diet?  Yes Nicotine Abuse?  No Medication Compliance?  No Exercise?  Yes Alcohol Abuse?  No  HTN - patient's BP is at goal today.  He recently started HCTZ 227mdaily and Lisniopril was increased to 4010maily  Hyperlipidemia - patient has been taking Crestor 6m50mily and Zetia 10mg75mly.  He does states that Zetia is not covered by insurnace.  Due to recheck lipids today -  Exam Edema:  negative   BMI:  Body mass index is 23.39 kg/(m^2).   Weight changes:  stable General Appearance:  alert, oriented, no acute distress and well nourished Mood/Affect:  normal **patient has interrupter - AntElberta Fortis him today**   Lab Results  Component Value Date   HGBA1C 7.2 06/21/2014    Lab Results  Component Value Date   MICROKYHCWCBJSE 83152/2015    03/08/2014 LDL = 143 Tg = 190 Total Cholesterol = 229 HDL = 48   Assessment: 1.  Diabetes. Improved A1c but elevated HBG readings due to being out of insulin 2.  Blood Pressure.  At goal today 3.  Lipids.  Due to recheck today     Recommendations: 1.  Medication recommendations at this time are as follows:    No medication changes today.  I did ask patient about drug formulary sent in the mail. He received and will bring to next appt to make sure we are prescribing the lowest available alternatives for medications. 2.  Reviewed HBG goals:  Fasting 80-130 and 1-2 hour post prandial <180.  Patient is instructed to check BG 1 times per day.    3.  BP goal < 140/85. 4.  LDL goal of < 100, HDL > 40 and TG < 150. 5.  Eye Exam yearly and Dental Exam every 6 months. 6.  Dietary recommendations:  Reviewed food that increase Tg and BG  7.   Orders Placed This Encounter  Procedures  . CMP14+EGFR  . Lipid panel  . Microalbumin, urine  . Vit D  25 hydroxy (rtn osteoporosis monitoring)  . LDL cholesterol, direct  . POCT glycosylated hemoglobin (Hb A1C)   9.  Return to clinic in 3 months - ChrisEvelina Dunand 6 months with me   Time spent counseling patient:  60 minutes  Referring provider: HawksBurnis MedinrmD, CPP, CDE

## 2014-06-21 NOTE — Patient Instructions (Signed)
Checking into rescheduling hernia repair with general surgeon (Dr Ninfa Linden).  If you have not heard anything from our referral department in 2 weeks (December 18th) please all [phone] and ask to speak to Debbi or Carlon.   Diabetes and Standards of Medical Care   Diabetes is complicated. You may find that your diabetes team includes a dietitian, nurse, diabetes educator, eye doctor, and more. To help everyone know what is going on and to help you get the care you deserve, the following schedule of care was developed to help keep you on track. Below are the tests, exams, vaccines, medicines, education, and plans you will need.  Blood Glucose Goals Prior to meals = 80 - 130 Within 2 hours of the start of a meal = less than 180  HbA1c test (goal is less than 7.0% - your last value was 7.2%) This test shows how well you have controlled your glucose over the past 2 to 3 months. It is used to see if your diabetes management plan needs to be adjusted.   It is performed at least 2 times a year if you are meeting treatment goals.  It is performed 4 times a year if therapy has changed or if you are not meeting treatment goals.  Blood pressure test  This test is performed at every routine medical visit. The goal is less than 140/90 mmHg for most people, but 130/80 mmHg in some cases. Ask your health care provider about your goal.  Dental exam  Follow up with the dentist regularly.  Eye exam  If you are diagnosed with type 1 diabetes as a child, get an exam upon reaching the age of 21 years or older and have had diabetes for 3 to 5 years. Yearly eye exams are recommended after that initial eye exam.  If you are diagnosed with type 1 diabetes as an adult, get an exam within 5 years of diagnosis and then yearly.  If you are diagnosed with type 2 diabetes, get an exam as soon as possible after the diagnosis and then yearly.  Foot care exam  Visual foot exams are performed at every routine  medical visit. The exams check for cuts, injuries, or other problems with the feet.  A comprehensive foot exam should be done yearly. This includes visual inspection as well as assessing foot pulses and testing for loss of sensation.  Check your feet nightly for cuts, injuries, or other problems with your feet. Tell your health care provider if anything is not healing.  Kidney function test (urine microalbumin)  This test is performed once a year.  Type 1 diabetes: The first test is performed 5 years after diagnosis.  Type 2 diabetes: The first test is performed at the time of diagnosis.  A serum creatinine and estimated glomerular filtration rate (eGFR) test is done once a year to assess the level of chronic kidney disease (CKD), if present.  Lipid profile (cholesterol, HDL, LDL, triglycerides)  Performed every 5 years for most people.  The goal for LDL is less than 100 mg/dL. If you are at high risk, the goal is less than 70 mg/dL.  The goal for HDL is 40 mg/dL to 50 mg/dL for men and 50 mg/dL to 60 mg/dL for women. An HDL cholesterol of 60 mg/dL or higher gives some protection against heart disease.  The goal for triglycerides is less than 150 mg/dL.  Influenza vaccine, pneumococcal vaccine, and hepatitis B vaccine  The influenza vaccine is recommended yearly.  The pneumococcal vaccine is generally given once in a lifetime. However, there are some instances when another vaccination is recommended. Check with your health care provider.  The hepatitis B vaccine is also recommended for adults with diabetes.  Diabetes self-management education  Education is recommended at diagnosis and ongoing as needed.  Treatment plan  Your treatment plan is reviewed at every medical visit.  Document Released: 05/02/2009 Document Revised: 03/07/2013 Document Reviewed: 12/05/2012 [facility] Patient Information 2014 [place].

## 2014-06-22 LAB — CMP14+EGFR
ALK PHOS: 74 IU/L (ref 39–117)
ALT: 15 IU/L (ref 0–44)
AST: 16 IU/L (ref 0–40)
Albumin/Globulin Ratio: 1.5 (ref 1.1–2.5)
Albumin: 4 g/dL (ref 3.5–5.5)
BUN/Creatinine Ratio: 19 (ref 9–20)
BUN: 54 mg/dL — AB (ref 6–24)
CO2: 17 mmol/L — AB (ref 18–29)
Calcium: 8.6 mg/dL — ABNORMAL LOW (ref 8.7–10.2)
Chloride: 102 mmol/L (ref 97–108)
Creatinine, Ser: 2.8 mg/dL — ABNORMAL HIGH (ref 0.76–1.27)
GFR calc Af Amer: 29 mL/min/{1.73_m2} — ABNORMAL LOW (ref >59)
GFR calc non Af Amer: 25 mL/min/{1.73_m2} — ABNORMAL LOW (ref >59)
Globulin, Total: 2.6 g/dL (ref 1.5–4.5)
Glucose: 241 mg/dL — ABNORMAL HIGH (ref 65–99)
Potassium: 5.3 mmol/L — ABNORMAL HIGH (ref 3.5–5.2)
SODIUM: 135 mmol/L (ref 134–144)
Total Bilirubin: 0.2 mg/dL (ref 0.0–1.2)
Total Protein: 6.6 g/dL (ref 6.0–8.5)

## 2014-06-22 LAB — VITAMIN D 25 HYDROXY (VIT D DEFICIENCY, FRACTURES): Vit D, 25-Hydroxy: 34.8 ng/mL (ref 30.0–100.0)

## 2014-06-22 LAB — LIPID PANEL
CHOL/HDL RATIO: 4.6 ratio (ref 0.0–5.0)
CHOLESTEROL TOTAL: 196 mg/dL (ref 100–199)
HDL: 43 mg/dL (ref >39)
LDL Calculated: 122 mg/dL — ABNORMAL HIGH (ref 0–99)
TRIGLYCERIDES: 155 mg/dL — AB (ref 0–149)
VLDL Cholesterol Cal: 31 mg/dL (ref 5–40)

## 2014-06-22 LAB — LDL CHOLESTEROL, DIRECT: LDL DIRECT: 123 mg/dL — AB (ref 0–99)

## 2014-06-22 LAB — MICROALBUMIN, URINE: Microalbumin, Urine: 723.7 ug/mL — ABNORMAL HIGH (ref 0.0–17.0)

## 2014-06-24 ENCOUNTER — Telehealth: Payer: Self-pay | Admitting: Pharmacist

## 2014-06-24 MED ORDER — VITAMIN D 1000 UNITS PO CAPS: 1000.0000 [IU] | ORAL_CAPSULE | Freq: Every day | ORAL | Status: DC

## 2014-06-24 NOTE — Telephone Encounter (Signed)
Serum creatinine increased again - patient is being follow by nephrologist at [facility] and has appt in January for follow up. Potassium slight elevated but better compared to 3 months ago Lipids still not at goal but improving. Patient is at max dose of Crestor but has been out of Zetia. Patient cannot afford and we did not have any samples of Zetia.  Continue to limit high fat foods, take crestor 65mf daily and will get samples for patient when able.  Microalbumin elevated but improved.  Vitamin D better. Continue supplementation at 1000 IU daily

## 2014-07-01 ENCOUNTER — Telehealth: Payer: Self-pay | Admitting: *Deleted

## 2014-07-01 MED ORDER — SITAGLIPTIN PHOSPHATE 25 MG PO TABS: 25.0000 mg | ORAL_TABLET | Freq: Every day | ORAL | Status: DC

## 2014-07-01 NOTE — Telephone Encounter (Signed)
Ok to change to [facility] but will need to adjust for low serum creatinine. Discontinue Tradgenta Rx for Januvia 25mg  1 tablet daily sent to [facility].  Butch Penny please notify patient's family.

## 2014-07-01 NOTE — Telephone Encounter (Signed)
Craig Fisher, the insurance co will not pay for trajenta unless he has tried and failed meds on formulary, these are janumet, [place], kombiglyze xr and [employer]. Are any of these close to trajenta?  I talked with his daughter and she said to her knowledge that he had not taken any of these, so I explained the situation to her that he might have to Craig Fisher one or more of these formulary meds before trajenta is covered.

## 2014-07-02 ENCOUNTER — Telehealth: Payer: Self-pay | Admitting: *Deleted

## 2014-07-02 NOTE — Telephone Encounter (Signed)
Advised daughter that ins would not cover trajenta and that Craig Fisher had changed to [place] and he should try that until he comes back for his exam in jan. At which time she will re-evaluate his diabetes  And see if this medication is working. h is to let us know if he has any problems

## 2014-07-04 NOTE — Telephone Encounter (Signed)
Family (daughter) notified and seemed to understand that if he has problems with new medicine to let Dr. Gwyndolyn Kaufman and if it works let us know also.

## 2014-07-22 ENCOUNTER — Encounter: Payer: Self-pay | Admitting: Family Medicine

## 2014-07-22 ENCOUNTER — Ambulatory Visit (INDEPENDENT_AMBULATORY_CARE_PROVIDER_SITE_OTHER): Payer: Commercial Indemnity

## 2014-07-22 ENCOUNTER — Ambulatory Visit (INDEPENDENT_AMBULATORY_CARE_PROVIDER_SITE_OTHER): Payer: Commercial Indemnity | Admitting: Family Medicine

## 2014-07-22 VITALS — BP 137/64 | HR 68 | Temp 97.5°F | Ht 63.0 in | Wt 132.6 lb

## 2014-07-22 DIAGNOSIS — M79604 Pain in right leg: Secondary | ICD-10-CM

## 2014-07-22 DIAGNOSIS — I10 Essential (primary) hypertension: Secondary | ICD-10-CM

## 2014-07-22 DIAGNOSIS — E875 Hyperkalemia: Secondary | ICD-10-CM

## 2014-07-22 DIAGNOSIS — N183 Chronic kidney disease, stage 3 (moderate): Secondary | ICD-10-CM

## 2014-07-22 LAB — POCT CBC
Granulocyte percent: 77.9 %G (ref 37–80)
HCT, POC: 27.2 % — AB (ref 43.5–53.7)
Hemoglobin: 9.3 g/dL — AB (ref 14.1–18.1)
Lymph, poc: 1.3 (ref 0.6–3.4)
MCH, POC: 29.6 pg (ref 27–31.2)
MCHC: 34.4 g/dL (ref 31.8–35.4)
MCV: 85.9 fL (ref 80–97)
MPV: 5.5 fL (ref 0–99.8)
POC Granulocyte: 6.6 (ref 2–6.9)
POC LYMPH PERCENT: 15.8 %L (ref 10–50)
Platelet Count, POC: 345 10*3/uL (ref 142–424)
RBC: 3.2 M/uL — AB (ref 4.69–6.13)
RDW, POC: 13 %
WBC: 8.5 10*3/uL (ref 4.6–10.2)

## 2014-07-22 MED ORDER — METOPROLOL TARTRATE 50 MG PO TABS: 50.0000 mg | ORAL_TABLET | Freq: Two times a day (BID) | ORAL | Status: DC

## 2014-07-22 MED ORDER — AMLODIPINE BESYLATE 10 MG PO TABS: 10.0000 mg | ORAL_TABLET | Freq: Every day | ORAL | Status: DC

## 2014-07-22 MED ORDER — HYDROCODONE-ACETAMINOPHEN 5-325 MG PO TABS: 1.0000 | ORAL_TABLET | Freq: Four times a day (QID) | ORAL | Status: DC | PRN

## 2014-07-22 NOTE — Progress Notes (Signed)
Subjective:    Patient ID: Craig Fisher, male    DOB: 18-Jun-1961, 54 y.o.   MRN: [id number]  HPI Patient is c/o right foot swelling.  He has been hurting in his right first toe and foot.  He awoke with this pain in his foot 4 days ago.  He has moderate to severe pain.  He has CKD and has been having hyperkalemia.  He has DM.  He is seeing Nephrology in 08/11/14.  His last 2 potassium levels have been elevated. He is taking lisinopril 95m po qd.  He has elevated microalbumin.  His kidney functions were worse last labs.  Review of Systems  Constitutional: Negative for fever.  HENT: Negative for ear pain.   Eyes: Negative for discharge.  Respiratory: Negative for cough.   Cardiovascular: Negative for chest pain.  Gastrointestinal: Negative for abdominal distention.  Endocrine: Negative for polyuria.  Genitourinary: Negative for difficulty urinating.  Musculoskeletal: Negative for gait problem and neck pain.  Skin: Negative for color change and rash.  Neurological: Negative for speech difficulty and headaches.  Psychiatric/Behavioral: Negative for agitation.       Objective:    BP 137/64 mmHg  Pulse 68  Temp(Src) 97.5 F (36.4 C) (Oral)  Ht 5' 3"  (1.6 m)  Wt 132 lb 9.6 oz (60.147 kg)  BMI 23.49 kg/m2 Physical Exam  Constitutional: He is oriented to person, place, and time. He appears well-developed and well-nourished.  HENT:  Head: Normocephalic and atraumatic.  Mouth/Throat: Oropharynx is clear and moist.  Eyes: Pupils are equal, round, and reactive to light.  Neck: Normal range of motion. Neck supple.  Cardiovascular: Normal rate and regular rhythm.   No murmur heard. Pulmonary/Chest: Effort normal and breath sounds normal.  Abdominal: Soft. Bowel sounds are normal. There is no tenderness.  Musculoskeletal:  Right foot erythematous and mild pedal edema.    Neurological: He is alert and oriented to person, place, and time.  Skin: Skin is warm and dry.  Psychiatric: He has  a normal mood and affect.    Xray right foot - calcification of vessels but no fractures seen Prelimnary reading by WIverson Alamin Results for orders placed or performed in visit on 07/22/14  POCT CBC  Result Value Ref Range   WBC 8.5 4.6 - 10.2 K/uL   Lymph, poc 1.3 0.6 - 3.4   POC LYMPH PERCENT 15.8 10 - 50 %L   MID (cbc)  0 - 0.9   POC MID %  0 - 12 %M   POC Granulocyte 6.6 2 - 6.9   Granulocyte percent 77.9 37 - 80 %G   RBC 3.2 (A) 4.69 - 6.13 M/uL   Hemoglobin 9.3 (A) 14.1 - 18.1 g/dL   HCT, POC 27.2 (A) 43.5 - 53.7 %   MCV 85.9 80 - 97 fL   MCH, POC 29.6 27 - 31.2 pg   MCHC 34.4 31.8 - 35.4 g/dL   RDW, POC 13.0 %   Platelet Count, POC 345.0 142 - 424 K/uL   MPV 5.5 0 - 99.8 fL      Assessment & Plan:     ICD-9-CM ICD-10-CM   1. Pain of right lower extremity 729.5 M79.604 Uric acid     POCT CBC     DG Foot Complete Right     HYDROcodone-acetaminophen (NORCO) 5-325 MG per tablet     Lower Extremity Arterial Doppler Bilateral     CANCELED: BMP8+EGFR     CANCELED: POCT CBC  2.  CKD (chronic kidney disease), stage 3 (moderate) 585.3 N18.3 CMP14+EGFR     DISCONTINUED: amLODipine (NORVASC) 10 MG tablet  3. Hyperkalemia 276.7 E87.5 CMP14+EGFR     DISCONTINUED: amLODipine (NORVASC) 10 MG tablet  4. Essential hypertension 401.9 I10 metoprolol (LOPRESSOR) 50 MG tablet     DISCONTINUED: amLODipine (NORVASC) 10 MG tablet  GFR is low and need to come off lisinopril due to hyperkalemia.  He needs to avoid NSAIDS. DC lisinopril and start lopressor.  Advised to take day off from work.  Discussed he needs to follow up next week with PCP [facility].    Discussed that the pain in his right foot is probably gout.  Take hydrocodone for now.  Return if symptoms worsen or fail to improve.  Lysbeth Penner FNP

## 2014-07-23 LAB — CMP14+EGFR
ALT: 12 IU/L (ref 0–44)
AST: 14 IU/L (ref 0–40)
Albumin/Globulin Ratio: 1.6 (ref 1.1–2.5)
Albumin: 3.9 g/dL (ref 3.5–5.5)
Alkaline Phosphatase: 81 IU/L (ref 39–117)
BUN/Creatinine Ratio: 16 (ref 9–20)
BUN: 44 mg/dL — ABNORMAL HIGH (ref 6–24)
CO2: 19 mmol/L (ref 18–29)
Calcium: 8.4 mg/dL — ABNORMAL LOW (ref 8.7–10.2)
Chloride: 103 mmol/L (ref 97–108)
Creatinine, Ser: 2.75 mg/dL — ABNORMAL HIGH (ref 0.76–1.27)
GFR calc Af Amer: 29 mL/min/{1.73_m2} — ABNORMAL LOW (ref >59)
GFR calc non Af Amer: 25 mL/min/{1.73_m2} — ABNORMAL LOW (ref >59)
Globulin, Total: 2.5 g/dL (ref 1.5–4.5)
Glucose: 157 mg/dL — ABNORMAL HIGH (ref 65–99)
Potassium: 3.9 mmol/L (ref 3.5–5.2)
Sodium: 138 mmol/L (ref 134–144)
Total Bilirubin: 0.3 mg/dL (ref 0.0–1.2)
Total Protein: 6.4 g/dL (ref 6.0–8.5)

## 2014-07-23 LAB — URIC ACID: Uric Acid: 7.6 mg/dL (ref 3.7–8.6)

## 2014-07-25 ENCOUNTER — Encounter: Payer: Self-pay | Admitting: Family Medicine

## 2014-07-25 ENCOUNTER — Ambulatory Visit (INDEPENDENT_AMBULATORY_CARE_PROVIDER_SITE_OTHER): Payer: Commercial Indemnity | Admitting: Family Medicine

## 2014-07-25 VITALS — BP 123/69 | HR 82 | Temp 97.0°F | Ht 63.0 in | Wt 133.0 lb

## 2014-07-25 DIAGNOSIS — M79671 Pain in right foot: Secondary | ICD-10-CM

## 2014-07-25 DIAGNOSIS — M79674 Pain in right toe(s): Secondary | ICD-10-CM

## 2014-07-25 DIAGNOSIS — M25571 Pain in right ankle and joints of right foot: Secondary | ICD-10-CM

## 2014-07-25 DIAGNOSIS — M7989 Other specified soft tissue disorders: Secondary | ICD-10-CM

## 2014-07-25 MED ORDER — CEPHALEXIN 500 MG PO CAPS: 500.0000 mg | ORAL_CAPSULE | Freq: Four times a day (QID) | ORAL | Status: DC

## 2014-07-25 MED ORDER — METHYLPREDNISOLONE ACETATE 80 MG/ML IJ SUSP
80.0000 mg | Freq: Once | INTRAMUSCULAR | Status: DC
Start: 2014-07-25 — End: 2015-03-28

## 2014-07-25 MED ORDER — METHYLPREDNISOLONE (PAK) 4 MG PO TABS: ORAL_TABLET | ORAL | Status: DC

## 2014-07-25 NOTE — Progress Notes (Signed)
   Subjective:    Patient ID: Craig Fisher, male    DOB: Dec 13, 1960, 54 y.o.   MRN: [id number]  HPI Patient is here for follow up on right foot pain and swelling.  He has been working and the pain is severe.  He is taking hydrocodone but not at work.  He did have labs and it showed stable stage 4 CKD and anemia.  He is scheduled to see Nephrology this month.  He has had Lisinopril dc'd and he has been taking lopressor.  He Is here for the right foot.  Review of Systems  Constitutional: Negative for fever.  HENT: Negative for ear pain.   Eyes: Negative for discharge.  Respiratory: Negative for cough.   Cardiovascular: Negative for chest pain.  Gastrointestinal: Negative for abdominal distention.  Endocrine: Negative for polyuria.  Genitourinary: Negative for difficulty urinating.  Musculoskeletal: Negative for gait problem and neck pain.  Skin: Negative for color change and rash.  Neurological: Negative for speech difficulty and headaches.  Psychiatric/Behavioral: Negative for agitation.       Objective:    BP 123/69 mmHg  Pulse 82  Temp(Src) 97 F (36.1 C) (Oral)  Ht 5\' 3"  (1.6 m)  Wt 133 lb (60.328 kg)  BMI 23.57 kg/m2 Physical Exam  Constitutional: He is oriented to person, place, and time. He appears well-developed and well-nourished.  HENT:  Head: Normocephalic and atraumatic.  Mouth/Throat: Oropharynx is clear and moist.  Eyes: Pupils are equal, round, and reactive to light.  Neck: Normal range of motion. Neck supple.  Cardiovascular: Normal rate and regular rhythm.   No murmur heard. Pulmonary/Chest: Effort normal and breath sounds normal.  Abdominal: Soft. Bowel sounds are normal. There is no tenderness.  Musculoskeletal:  Right foot and pre-tibial region with mild edema.  TTP medial malleoulus and first toe.    Neurological: He is alert and oriented to person, place, and time.  Skin: Skin is warm and dry.  Psychiatric: He has a normal mood and affect.          Assessment & Plan:     ICD-9-CM ICD-10-CM   1. Pain and swelling of toe of right foot 729.5 M79.671 methylPREDNIsolone (MEDROL DOSPACK) 4 MG tablet   729.81 M79.89 methylPREDNISolone acetate (DEPO-MEDROL) injection 80 mg     cephALEXin (KEFLEX) 500 MG capsule  2. Pain in joint of right foot 719.47 M25.571 methylPREDNIsolone (MEDROL DOSPACK) 4 MG tablet     methylPREDNISolone acetate (DEPO-MEDROL) injection 80 mg     cephALEXin (KEFLEX) 500 MG capsule   Start medrol dose pack, stay off foot and out of work until next Monday in 4 days. He is pending arterial doppler since he doesn't have good pulses and his xray showed calcified vessels.  Do not think this is DVT or blood clot and despite having normal uric acid feel this is probably gout.  Consider MRI of right foot if not better.  Return if symptoms worsen or fail to improve.  Lysbeth Penner FNP

## 2014-07-31 ENCOUNTER — Encounter: Payer: Self-pay | Admitting: Family

## 2014-07-31 ENCOUNTER — Ambulatory Visit (INDEPENDENT_AMBULATORY_CARE_PROVIDER_SITE_OTHER): Payer: Commercial Indemnity | Admitting: Family

## 2014-07-31 VITALS — BP 148/69 | HR 59 | Temp 97.0°F | Ht 63.0 in | Wt 130.4 lb

## 2014-07-31 DIAGNOSIS — N189 Chronic kidney disease, unspecified: Secondary | ICD-10-CM

## 2014-07-31 DIAGNOSIS — M79674 Pain in right toe(s): Secondary | ICD-10-CM

## 2014-07-31 DIAGNOSIS — M7989 Other specified soft tissue disorders: Secondary | ICD-10-CM

## 2014-07-31 DIAGNOSIS — M79671 Pain in right foot: Secondary | ICD-10-CM

## 2014-07-31 MED ORDER — TORSEMIDE 20 MG PO TABS: 20.0000 mg | ORAL_TABLET | Freq: Every day | ORAL | Status: DC

## 2014-07-31 NOTE — Progress Notes (Signed)
   Subjective:    Patient ID: Craig Fisher, male    DOB: 05/31/1961, 54 y.o.   MRN: [id number]  HPI Pt presents to the office for follow-up with right foot and ankle pain. Pt has 3+ edema and states his swelling get worse at night. States after he gets off work it is swollen and "very tight" with pain. Pt states his pain in his foot is a 10 out 10 that goes up to his groin.   Pt was seen in the office by Bill FNP, and thought it could be gout. He also has a doppler appointment that he is going to call and schedule asap. He has a nephrologists appointment this month for CKD.    Review of Systems  Constitutional: Negative.   HENT: Negative.   Respiratory: Negative.   Cardiovascular: Positive for leg swelling.  Gastrointestinal: Negative.   Endocrine: Negative.   Genitourinary: Negative.   Musculoskeletal: Positive for joint swelling.  Neurological: Negative.   Hematological: Negative.   Psychiatric/Behavioral: Negative.   All other systems reviewed and are negative.      Objective:   Physical Exam  Constitutional: He is oriented to person, place, and time. He appears well-developed and well-nourished. No distress.  HENT:  Head: Normocephalic.  Mouth/Throat: Oropharynx is clear and moist.  Cardiovascular: Normal rate, regular rhythm, normal heart sounds and intact distal pulses.   No murmur heard. Pulmonary/Chest: Effort normal and breath sounds normal. No respiratory distress. He has no wheezes.  Abdominal: Soft. Bowel sounds are normal. He exhibits no distension. There is no tenderness.  Musculoskeletal: Normal range of motion. He exhibits edema and tenderness.  Right foot 3+ swelling in foot and ankle. Tenderness present with palpation of right great toe joint and ankle. Decreased pedal pulse in right.  Neurological: He is alert and oriented to person, place, and time. He has normal reflexes. No cranial nerve deficit.  Skin: Skin is warm and dry. No rash noted. No erythema.    Psychiatric: He has a normal mood and affect. His behavior is normal. Judgment and thought content normal.  Vitals reviewed.   BP 148/69 mmHg  Pulse 59  Temp(Src) 97 F (36.1 C) (Oral)  Ht 5\' 3"  (1.6 m)  Wt 130 lb 6.4 oz (59.149 kg)  BMI 23.11 kg/m2       Assessment & Plan:  1. Pain and swelling of toe of right foot -keep elevated schedule doppler appointment asap-Rule out blood clot - Ambulatory referral to Vascular Surgery - torsemide (DEMADEX) 20 MG tablet; Take 1 tablet (20 mg total) by mouth daily.  Dispense: 30 tablet; Refill: 1  2. Chronic kidney disease, unspecified stage -Keep nephrologists appointment - torsemide (DEMADEX) 20 MG tablet; Take 1 tablet (20 mg total) by mouth daily.  Dispense: 30 tablet; Refill: 1  * Note given to pt for work  Evelina Dun, [place]

## 2014-07-31 NOTE — Patient Instructions (Signed)
Edema (Edema) Un edema es una acumulacin anormal de lquidos. Es ms frecuente en las piernas y los muslos. La hinchazn indolora de pies y tobillos es ms probable a medida que una persona envejece. Tambin es comn en la piel ms floja, como alrededor [employer]. CUIDADOS EN EL HOGAR   Mantenga la parte afectada del cuerpo por encima del nivel del corazn mientras est recostado.  No se quede quieto ni permanezca de pie durante [employer].  No coloque nada exactamente debajo de las rodillas al recostarse.  No use ropa ajustada en los muslos.  Ejercite las piernas para ayudar a que la inflamacin (hinchazn) disminuya.  Use vendajes elsticos o medias de compresin segn las indicaciones del mdico.  Una dieta con bajo contenido de sal puede ayudar a disminuir la hinchazn.  Solo tome los [employer] le haya indicado su mdico. SOLICITE AYUDA SI:  El tratamiento no funciona.  Tiene una enfermedad cardaca, heptica o renal, y nota que la piel parece hinchada o tiene aspecto brillante.  Tiene hinchazn en las piernas que no mejora cuando las eleva.  Ha aumentado sbitamente de peso sin ningn motivo. SOLICITE AYUDA DE INMEDIATO SI:   Tiene dificultad para respirar o le duele el pecho.  No puede respirar cuando se acuesta.  Las reas hinchadas presentan dolor, enrojecimiento o Freight forwarder.  Tiene una enfermedad cardaca, heptica o renal, y le aparece un edema de repente.  Tiene fiebre y los sntomas empeoran de manera sbita. ASEGRESE DE QUE:   Comprende estas instrucciones.  Controlar su afeccin.  Recibir ayuda de inmediato si no mejora o si empeora. Document Released: 04/25/2013 Document Revised: 07/10/2013 [facility] Patient Information 2015 [place]. This information is not intended to replace advice given to you by your health care provider. Make sure you discuss any questions you have with your health care provider.

## 2014-08-01 ENCOUNTER — Other Ambulatory Visit: Payer: Self-pay | Admitting: Radiology

## 2014-08-01 ENCOUNTER — Encounter (INDEPENDENT_AMBULATORY_CARE_PROVIDER_SITE_OTHER): Payer: Managed Care, Other (non HMO)

## 2014-08-01 ENCOUNTER — Encounter (HOSPITAL_BASED_OUTPATIENT_CLINIC_OR_DEPARTMENT_OTHER): Payer: Self-pay | Admitting: Surgery

## 2014-08-01 DIAGNOSIS — R0989 Other specified symptoms and signs involving the circulatory and respiratory systems: Secondary | ICD-10-CM

## 2014-08-01 DIAGNOSIS — M79674 Pain in right toe(s): Secondary | ICD-10-CM

## 2014-08-01 DIAGNOSIS — M7989 Other specified soft tissue disorders: Secondary | ICD-10-CM

## 2014-08-01 DIAGNOSIS — M79671 Pain in right foot: Secondary | ICD-10-CM

## 2014-08-01 DIAGNOSIS — M79604 Pain in right leg: Secondary | ICD-10-CM

## 2014-08-13 ENCOUNTER — Other Ambulatory Visit: Payer: Self-pay

## 2014-08-13 ENCOUNTER — Ambulatory Visit (INDEPENDENT_AMBULATORY_CARE_PROVIDER_SITE_OTHER): Payer: Commercial Indemnity | Admitting: Family Medicine

## 2014-08-13 ENCOUNTER — Encounter: Payer: Self-pay | Admitting: Family Medicine

## 2014-08-13 VITALS — BP 153/70 | HR 60 | Temp 97.1°F | Ht 63.0 in | Wt 129.8 lb

## 2014-08-13 DIAGNOSIS — M25571 Pain in right ankle and joints of right foot: Secondary | ICD-10-CM

## 2014-08-13 DIAGNOSIS — M79604 Pain in right leg: Secondary | ICD-10-CM

## 2014-08-13 MED ORDER — HYDROCODONE-ACETAMINOPHEN 5-325 MG PO TABS
1.0000 | ORAL_TABLET | Freq: Four times a day (QID) | ORAL | Status: DC | PRN
Start: 2014-08-13 — End: 2015-03-28

## 2014-08-13 NOTE — Progress Notes (Signed)
   Subjective:    Patient ID: Craig Fisher, male    DOB: Nov 02, 1960, 54 y.o.   MRN: [id number]  HPI Patient is here c/o severe right foot, ankle, and leg pain.  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  The pain is in his right medial malleolus and medial ankle area.   He has been having swelling in the right foot and ankle and after working on it the swelling travels into the leg.  He has calcified vessels in his right LE and foot on xrays.  He has poor pulses.  He has hx of CKD and is scheduled to see nephrology.  He recently had bp meds changed due to hyperkalemia.  He did have normal uric acid level.  He was not able to walk on the foot yesterday due to the pain but is able to walk on the foot today.  There is no necrosis of his toes or feet.  Review of Systems  Constitutional: Negative for fever.  HENT: Negative for ear pain.   Eyes: Negative for discharge.  Respiratory: Negative for cough.   Cardiovascular: Negative for chest pain.  Gastrointestinal: Negative for abdominal distention.  Endocrine: Negative for polyuria.  Genitourinary: Negative for difficulty urinating.  Musculoskeletal: Negative for gait problem and neck pain.  Skin: Negative for color change and rash.  Neurological: Negative for speech difficulty and headaches.  Psychiatric/Behavioral: Negative for agitation.       Objective:    BP 153/70 mmHg  Pulse 60  Temp(Src) 97.1 F (36.2 C) (Oral)  Ht 5\' 3"  (1.6 m)  Wt 129 lb 12.8 oz (58.877 kg)  BMI 23.00 kg/m2 Physical Exam  Constitutional: He is oriented to person, place, and time. He appears well-developed and well-nourished.  HENT:  Head: Normocephalic and atraumatic.  Mouth/Throat: Oropharynx is clear and moist.  Eyes: Pupils are equal, round, and reactive to light.  Neck: Normal range of motion. Neck supple.  Cardiovascular: Normal rate and regular rhythm.   No murmur  heard. Pulmonary/Chest: Effort normal and breath sounds normal.  Abdominal: Soft. Bowel sounds are normal. There is no tenderness.  Musculoskeletal:  Right foot swollen, erythematous, warm, no DP or PT pulses but normal capillary refill in toes, TTP medial malleolus and medial ankle, Pre tibial and pedal edema.   Neurological: He is alert and oriented to person, place, and time.  Skin: Skin is warm and dry.  Psychiatric: He has a normal mood and affect.          Assessment & Plan:     ICD-9-CM ICD-10-CM   1. Pain of right lower extremity 729.5 M79.604 MR Ankle Right W Wo Contrast     HYDROcodone-acetaminophen (NORCO) 5-325 MG per tablet   Order MRI of right LE stat, Right LE ABI abnormal but not sure this is totally vascular and concerned may be more orthopedic.  Continue follow up with vascular appointment.  Will not order arteriogram due to CKD and will defer to vascular.  See if MRI w/o contrast will offer more information.  For now note for 3 days and back to work next Monday.  Off Right LE and crutches rx given.  Follow up in one week with PCP or myself.  CKD - Follow up with Nephrology at [facility].  PVD - Follow up with Vascular.  No Follow-up on file.  Lysbeth Penner FNP

## 2014-08-15 ENCOUNTER — Telehealth: Payer: Self-pay | Admitting: Family Medicine

## 2014-08-19 ENCOUNTER — Encounter: Payer: Self-pay | Admitting: Vascular Surgery

## 2014-08-20 ENCOUNTER — Ambulatory Visit: Payer: Commercial Indemnity | Admitting: Family

## 2014-08-20 ENCOUNTER — Other Ambulatory Visit: Payer: Self-pay | Admitting: Vascular Surgery

## 2014-08-20 ENCOUNTER — Encounter: Payer: Self-pay | Admitting: Vascular Surgery

## 2014-08-20 ENCOUNTER — Ambulatory Visit (HOSPITAL_COMMUNITY)
Admission: RE | Admit: 2014-08-20 | Discharge: 2014-08-20 | Disposition: A | Discharge status: Home / Self Care | Payer: PRIVATE HEALTH INSURANCE | Source: Ambulatory Visit | Attending: Vascular Surgery | Admitting: Vascular Surgery

## 2014-08-20 ENCOUNTER — Ambulatory Visit (INDEPENDENT_AMBULATORY_CARE_PROVIDER_SITE_OTHER): Payer: PRIVATE HEALTH INSURANCE | Admitting: Vascular Surgery

## 2014-08-20 DIAGNOSIS — M79604 Pain in right leg: Secondary | ICD-10-CM

## 2014-08-20 DIAGNOSIS — M7989 Other specified soft tissue disorders: Secondary | ICD-10-CM

## 2014-08-20 DIAGNOSIS — R609 Edema, unspecified: Secondary | ICD-10-CM | POA: Diagnosis not present

## 2014-08-20 NOTE — Progress Notes (Signed)
Subjective:     Patient ID: Craig Fisher, male   DOB: 01/05/1961, 54 y.o.   MRN: [id number]  HPI this 54 year old Hispanic male is accompanied by an interpreter. Patient is referred for evaluation of pain and swelling in the right leg. Patient has no history of DVT. He apparently developed pain and swelling in the right calf and ankle area over the past month. He has been seen by an orthopedic surgeon in no fractures in the ankle were noted. He does have a history of insulin-dependent diabetes mellitus. He has mild claudication symptoms. He has no history of rest pain, nonhealing ulcers, infection, or gangrene of the right foot. He does not elevate his foot while sleeping or wear elastic compression stockings. He has no symptoms in the contralateral left leg and denies any trauma to the ankle.  Past Medical History  Diagnosis Date  . Diabetes mellitus without complication   . Hyperlipidemia   . Hypertension   . Full dentures   . Wears glasses   . Blind left eye   . Chronic kidney disease   . Anemia     History  Substance Use Topics  . Smoking status: Never Smoker   . Smokeless tobacco: Never Used  . Alcohol Use: No    Family History  Problem Relation Age of Onset  . Diabetes Mother   . Diabetes Sister   . Esophageal cancer Neg Hx   . Colon cancer Neg Hx   . Rectal cancer Neg Hx   . Stomach cancer Neg Hx     No Known Allergies   Current outpatient prescriptions:  .  amLODipine (NORVASC) 10 MG tablet, Take 1 tablet (10 mg total) by mouth daily., Disp: 90 tablet, Rfl: 3 .  Cholecalciferol (VITAMIN D) 1000 UNITS capsule, Take 1 capsule (1,000 Units total) by mouth daily., Disp: , Rfl:  .  hydrochlorothiazide (HYDRODIURIL) 25 MG tablet, Take 1 tablet (25 mg total) by mouth daily., Disp: 90 tablet, Rfl: 3 .  HYDROcodone-acetaminophen (NORCO) 5-325 MG per tablet, Take 1 tablet by mouth every 6 (six) hours as needed for moderate pain., Disp: 30 tablet, Rfl: 0 .  insulin glargine  (LANTUS) 100 UNIT/ML injection, Inject 0.25 mLs (25 Units total) into the skin daily., Disp: 30 mL, Rfl: 2 .  Insulin Syringes, Disposable, U-100 0.5 ML MISC, Use to inject insulin once daily., Disp: 100 each, Rfl: 2 .  metoprolol (LOPRESSOR) 50 MG tablet, Take 1 tablet (50 mg total) by mouth 2 (two) times daily., Disp: 180 tablet, Rfl: 3 .  rosuvastatin (CRESTOR) 40 MG tablet, Take 1 tablet (40 mg total) by mouth daily., Disp: 90 tablet, Rfl: 3 .  sitaGLIPtin (JANUVIA) 25 MG tablet, Take 1 tablet (25 mg total) by mouth daily., Disp: 30 tablet, Rfl: 2 .  timolol (TIMOPTIC) 0.5 % ophthalmic solution, Right eye daily- 1 drop, Disp: , Rfl:  .  torsemide (DEMADEX) 20 MG tablet, Take 1 tablet (20 mg total) by mouth daily., Disp: 30 tablet, Rfl: 1  Current facility-administered medications:  .  methylPREDNISolone acetate (DEPO-MEDROL) injection 80 mg, 80 mg, Intramuscular, Once, Lysbeth Penner, FNP  BP 142/70 mmHg  Pulse 58  Ht 5\' 3"  (1.6 m)  Wt 132 lb (59.875 kg)  BMI 23.39 kg/m2  SpO2 100%  Body mass index is 23.39 kg/(m^2).           Review of Systems denies chest pain, dyspnea on exertion, PND, orthopnea, hemoptysis, lateralizing weakness, aphasia, amaurosis fugax, diplopia, blurred vision, syncope.  Patient is blind in the left eye. He had as have diabetes mellitus which is insulin-dependent. Other systems negative and a complete review of systems     Objective:   Physical Exam BP 142/70 mmHg  Pulse 58  Ht 5\' 3"  (1.6 m)  Wt 132 lb (59.875 kg)  BMI 23.39 kg/m2  SpO2 100%  Gen.-alert and oriented x3 in no apparent distress HEENT normal for age except blind in left eye Lungs no rhonchi or wheezing Cardiovascular regular rhythm no murmurs carotid pulses 3+ palpable no bruits audible Abdomen soft nontender no palpable masses Musculoskeletal free of  major deformities Skin clear -no rashes Neurologic normal Lower extremities 3+ femoral and and popliteal pulses palpable  bilaterally. Excellent biphasic arterial flow in right posterior tibial artery and audible Doppler flow in right anterior tibial artery and ankle. 1+ edema from mid calf to foot which is easily noticeable compared to contralateral left leg which has no edema. No bulging varicosities noted. No hyperpigmentation or ulceration. No point tenderness.  Today I reviewed the previous records supplied which included arterial Doppler exam. This revealed calcified tibial vessels but loud biphasic flow in the right posterior tibial artery. Toe pressure on the right was less than on the left. No venous duplex study was included in these records.  Today I ordered a venous duplex exam of the right leg which I reviewed and interpreted. There is no evidence of DVt with a widely patent deep venous system and there is also widely patent great and small saphenous vein.      Assessment:    pain and swelling right leg-etiology unknown No evidence of DVT No evidence of severe arterial insufficiency which would cause swelling and pain to this degree     Plan:    #1 elevate foot of bed 3-4 inches #2 short leg elastic compression stockings 20-30 millimeter gradient to be placed on early in the morning #3 return to see me on a when necessary basis no evidence of any arterial or venous insufficiency to require further workup

## 2014-08-21 ENCOUNTER — Telehealth: Payer: Self-pay | Admitting: Family Medicine

## 2014-08-21 NOTE — Progress Notes (Signed)
Patient had interpreter his name was Craig Fisher.

## 2014-08-21 NOTE — Telephone Encounter (Signed)
Patient aware to contact dr. Mardi Mainland office for letter

## 2014-08-22 ENCOUNTER — Encounter: Payer: Self-pay | Admitting: Vascular Surgery

## 2014-09-23 ENCOUNTER — Ambulatory Visit: Payer: Self-pay | Admitting: Family

## 2014-12-26 ENCOUNTER — Ambulatory Visit: Payer: Self-pay

## 2015-03-28 ENCOUNTER — Encounter: Payer: Self-pay | Admitting: Family

## 2015-03-28 ENCOUNTER — Ambulatory Visit (INDEPENDENT_AMBULATORY_CARE_PROVIDER_SITE_OTHER): Payer: Commercial Indemnity | Admitting: Family

## 2015-03-28 VITALS — BP 171/83 | HR 69 | Temp 97.5°F | Ht 63.0 in | Wt 133.0 lb

## 2015-03-28 DIAGNOSIS — E785 Hyperlipidemia, unspecified: Secondary | ICD-10-CM | POA: Diagnosis not present

## 2015-03-28 DIAGNOSIS — E08319 Diabetes mellitus due to underlying condition with unspecified diabetic retinopathy without macular edema: Secondary | ICD-10-CM

## 2015-03-28 DIAGNOSIS — E1121 Type 2 diabetes mellitus with diabetic nephropathy: Secondary | ICD-10-CM

## 2015-03-28 DIAGNOSIS — I10 Essential (primary) hypertension: Secondary | ICD-10-CM

## 2015-03-28 LAB — POCT GLYCOSYLATED HEMOGLOBIN (HGB A1C): Hemoglobin A1C: 10.8

## 2015-03-28 LAB — GLUCOSE, POCT (MANUAL RESULT ENTRY): POC GLUCOSE: 260 mg/dL — AB (ref 70–99)

## 2015-03-28 LAB — POCT UA - MICROALBUMIN: MICROALBUMIN (UR) POC: 100 mg/L

## 2015-03-28 MED ORDER — AMLODIPINE BESYLATE 10 MG PO TABS: 10.0000 mg | ORAL_TABLET | Freq: Every day | ORAL | Status: DC

## 2015-03-28 MED ORDER — HYDROCHLOROTHIAZIDE 25 MG PO TABS: 25.0000 mg | ORAL_TABLET | Freq: Every day | ORAL | Status: DC

## 2015-03-28 MED ORDER — INSULIN GLARGINE 100 UNIT/ML ~~LOC~~ SOLN: 25.0000 [IU] | Freq: Every day | SUBCUTANEOUS | Status: DC

## 2015-03-28 MED ORDER — METOPROLOL TARTRATE 50 MG PO TABS: 50.0000 mg | ORAL_TABLET | Freq: Two times a day (BID) | ORAL | Status: DC

## 2015-03-28 MED ORDER — SITAGLIPTIN PHOSPHATE 25 MG PO TABS: 25.0000 mg | ORAL_TABLET | Freq: Every day | ORAL | Status: DC

## 2015-03-28 NOTE — Patient Instructions (Signed)
Diabetes mellitus tipo2 (Type 2 Diabetes Mellitus) La diabetes mellitus tipo2, generalmente denominada diabetes tipo2, es una enfermedad prolongada (crnica). En la diabetes tipo2, el pncreas no produce suficiente insulina (una hormona), las clulas son menos sensibles a la insulina que se produce (resistencia a la insulina), o ambos. Normalmente, la [employer] azcares de los alimentos a las clulas de los tejidos. Las clulas de los tejidos [employer] azcares para Dealer. La falta de insulina o la falta de una respuesta normal a la insulina hace que el exceso de azcar se acumule en la sangre en lugar de Location manager en las clulas de los tejidos. Como resultado, se producen niveles altos de Dispensing optician (hiperglucemia). El efecto de los niveles altos de azcar (glucosa) puede causar muchas complicaciones.  La diabetes tipo2 antes tambin se denominaba diabetes del [place], pero puede ocurrir a Hotel manager.  [place] persona tiene mayor predisposicin a desarrollar diabetes tipo 2 si alguien en su familia tiene la enfermedad y tambin tiene uno o ms de los siguientes factores de riesgo principales:  Sobrepeso.  Estilo de vida sedentario.  Una historia de consumo constante de alimentos de altas caloras. Mantener un peso saludable y realizar actividad fsica regular puede reducir la probabilidad de desarrollar diabetes tipo 2. SNTOMAS  Una persona con diabetes tipo 2 no presenta sntomas en un principio. Los sntomas de la diabetes tipo 2 aparecen lentamente. Los sntomas son:  Aumento de la sed (polidipsia).  Aumento de la miccin (poliuria).  Orina con ms frecuencia durante la noche (nocturia).  Prdida de peso. La prdida de peso puede ser muy rpida.  Infecciones frecuentes y recurrentes.  Cansancio (fatiga).  Debilidad.  Cambios en la visin, como visin borrosa.  Olor a Medical illustrator.  Dolor abdominal.  Nuseas o  vmitos.  Cortes o moretones que tardan en sanarse.  Hormigueo o adormecimiento de las manos y los pies. DIAGNSTICO Con frecuencia la diabetes tipo 2 no se diagnostica hasta que se presentan las complicaciones de la diabetes. La diabetes tipo 2 se diagnostica cuando los sntomas o las complicaciones se presentan y cuando aumentan los niveles de glucosa en la [place]. El nivel de glucosa en la sangre puede controlarse en uno o ms de los siguientes anlisis de sangre:  Medicin de glucosa en la sangre en [place]. No se le permitir comer durante al menos 8 horas antes de que se tome [place].  Pruebas al azar de glucosa en la sangre. El nivel de glucosa en la sangre se controla en cualquier momento del da sin importar el momento en que haya comido.  Prueba de A1c (hemoglobina glucosilada) Una prueba de A1c proporciona informacin sobre el control de la glucosa en la sangre durante los ltimos 3 meses.  Prueba de tolerancia a la glucosa oral (PTGO). La glucosa en la sangre se mide despus de no haber comido (ayunas) durante dos horas y despus de beber una bebida que contenga glucosa. TRATAMIENTO   Usted puede necesitar administrarse insulina o medicamentos para la diabetes todos los das para [employer] niveles de glucosa en la sangre en el rango deseado.  Si [place] insulina, tal vez necesite ajustar la dosis segn los carbohidratos que haya consumido en cada comida o colacin. El objetivo del tratamiento es mantener el nivel de azcar en la sangre previo a comer (glucosa preprandial) entre 37 y 142m/dl. INSTRUCCIONES PARA EL CUIDADO EN EL HOGAR   Controle su nivel de  hemoglobina A1c dos veces al ao.  Contrlese a diario Retail buyer de glucosa en la sangre segn las indicaciones de su mdico.  Supervise las cetonas en la orina cuando est enferma y segn las indicaciones de su [place] medicamento para la diabetes o adminstrese insulina segn las indicaciones de su  mdico para Contractor nivel de glucosa en la sangre en el rango deseado.  Nunca se quede sin medicamento para la diabetes o sin insulina. Es necesario que la reciba [employer].  Si [place] insulina, tal vez deba ajustar la cantidad de insulina administrada segn los carbohidratos consumidos. Los hidratos de carbono pueden aumentar los niveles de glucosa en la sangre, pero deben incluirse en su dieta. Los hidratos de carbono aportan vitaminas, minerales y [place] que son Ardelia Mems parte esencial de una dieta saludable. Los hidratos de carbono se encuentran en frutas, verduras, cereales integrales, productos lcteos, legumbres y alimentos que contienen azcares aadidos.  Consuma alimentos saludables. Programe una cita con un nutricionista certificado para que lo ayude a Building services engineer de alimentacin adecuado para usted.  Baje de peso si es necesario.  Lleve una tarjeta de alerta mdica o use una pulsera o medalla de alerta mdica.  Lleve con usted una colacin de 15gramos de hidratos de carbono en todo momento para controlar los niveles bajos de glucosa en la sangre (hipoglucemia). Algunos ejemplos de colaciones de 15gramos de hidratos de carbono son los siguientes:  Tabletas de glucosa, 3 o 4.  Gel de glucosa, tubo de 15 gramos.  Pasas de uva, 2 cucharadas (24 gramos).  Caramelos de goma, 6.  Galletas de [place], 8.  Gaseosa comn, 4onzas (152mlilitros).  Pastillas de goma, 9.  Reconocer la hipoglucemia. La hipoglucemia se produce cuando los niveles de glucosa en la sangre son de 70 mg/dl o menos. El riesgo de hipoglucemia aumenta durante el ayuno o cuando se saltea las comidas, durante o despus de rOptometristejercicio intenso y mCornvilleduerme. Los sntomas de hipoglucemia son:  Temblores o sacudidas.  Disminucin de la capacidad de concentracin.  Sudoracin.  Aumento de la frecuencia cardaca.  Dolor de c[place]  Sequedad en la  boca.  Hambre.  Irritabilidad.  Ansiedad.  Sueo agitado.  Alteracin del habla o de la coordinacin.  Confusin.  Tratar la hipoglucemia rpidamente. Si usted est alerta y puede tragar con seguridad, siga la regla de 15/15 que consiste en:  TMerck & Co15 y 20gramos de glucosa de accin rpida o carbohidratos. Las opciones de accin rpida son un gel de glucosa, tabletas de glucosa, o 4 onzas (120 ml) de jugo de frutas, gaseosa comn, o leche baja en grasa.  Compruebe su nivel de glucosa en la sangre 15 minutos despus de tomar la glucosa.  Tome entre 15 y 264gramos ms de glucosa si el nivel de glucosa en la sangre todava es de 753mdl o inferior.  Ingiera una comida o una colacin en el lapso de 1 hora una vez que los niveles de glucosa en la sangre vuelven a la normalidad.  Est atento a si siente mucha sed u orina con mayor frecuencia, porque son signos tempranos de hiperglucemia. El reconocimiento temprano de la hiperglucemia permite un tratamiento oportuno. Trate la hiperglucemia segn le indic su mdico.  Haga, al menos, 15060mtos de actividad fsica moderada a la semana, distribuidos en, por lo menos, 3das a la semana o como lo indique su mdico. Adems, debe realizar ejercicios de resistencia por lo menos 2veces a la semEntergy Corporation  o como lo indique su mdico. Trate de no permanecer inactivo durante ms de 53minutos seguidos.  Ajuste su medicamento y la ingesta de alimentos, segn sea necesario, si inicia un nuevo ejercicio o deporte.  Siga su plan para los das de enfermedad cuando no puede comer o beber como de [place].  No consuma ningn producto que contenga tabaco, como cigarrillos, tabaco de Higher education careers adviser o Psychologist, sport and exercise. Si necesita ayuda para dejar de fumar, hable con el mdico.  Limite el consumo de alcohol a no ms de 1 medida por da en las mujeres no embarazadas y 2 medidas en los hombres. Debe beber alcohol solo mientras come. Hable con su mdico  acerca de si el alcohol es seguro para usted. Informe a su mdico si bebe alcohol varias veces a la semana.  Concurra a todas las visitas de control como se lo haya indicado el mdico. Esto es importante.  Programe un examen de la vista poco despus del diagnstico de diabetes tipo 2 y luego anualmente.  Realice diariamente el cuidado de la piel y de los pies. Examine su piel y los pies diariamente para ver si tiene cortes, moretones, enrojecimiento, problemas en las uas, sangrado, ampollas o Pension scheme manager. Su mdico debe hacerle un examen de los pies una vez por ao.  Cepllese los dientes y encas por lo menos dos veces al da y use hilo dental al menos una vez por da. Concurra regularmente a las visitas de control con el dentista.  Comparta su plan de control de diabetes en el trabajo o en la escuela.  [place]. Se recomienda que las [employer] de 65aos con diabetes se apliquen la vacuna contra la neumona. En algunos casos, pueden administrarse dos inyecciones separadas. Pregntele al mdico si tiene la vacuna contra la neumona al da.  Aprenda a Engineer, maintenance (IT).  Obtenga la mayor cantidad posible de informacin sobre la diabetes y solicite ayuda siempre que sea necesario.  Busque programas de rehabilitacin y participe en ellos para mantener o mejorar su independencia y su calidad de vida. Solicite la derivacin a fisioterapia o terapia ocupacional si se le [employer] o la mano, o tiene problemas para asearse, vestirse, comer, o durante la [place] fsica. SOLICITE ATENCIN MDICA SI:   No puede comer alimentos o beber por ms de 6 horas.  Tuvo nuseas o ha vomitado durante ms de 6 horas.  Su nivel de glucosa en la sangre es mayor de 240 mg/dl.  Presenta algn cambio en el estado mental.  Desarrolla una enfermedad grave adicional.  Tuvo diarrea durante ms de 6 horas.  Ha estado enfermo o ha tenido fiebre durante un par de das y no  mejora.  Siente dolor al practicar cualquier actividad fsica. SOLICITE ATENCIN MDICA DE INMEDIATO SI:  Tiene dificultad para respirar.  Tiene niveles de cetonas moderados a altos. ASEGRESE DE QUE:  Comprende estas instrucciones.  Controlar su afeccin.  Recibir ayuda de inmediato si no mejora o si empeora. Document Released: 07/05/2005 Document Revised: 11/19/2013 [facility] Patient Information 2015 [place], [place]. This information is not intended to replace advice given to you by your health care provider. Make sure you discuss any questions you have with your health care provider.

## 2015-03-28 NOTE — Progress Notes (Signed)
Subjective:    Patient ID: Craig Fisher, male    DOB: [birth year], 54 y.o.   MRN: [id number]  Pt presents to the office for chronic follow up. Pt is spanish speaking and an interpreter is present. Pt states he broke his foot in January and had several tests and states he could not afford his medications. Pt states he stopped all of his diabetic, cholesterol and metoprolol medications. Pt states he is working now and can afford his medications at this time.  Diabetes He presents for his follow-up diabetic visit. He has type 2 diabetes mellitus. His disease course has been fluctuating. Pertinent negatives for hypoglycemia include no confusion, dizziness or headaches. Associated symptoms include visual change. Pertinent negatives for diabetes include no chest pain, no foot paresthesias and no foot ulcerations. Symptoms are worsening. Diabetic complications include nephropathy. Pertinent negatives for diabetic complications include no CVA, heart disease or peripheral neuropathy. Risk factors for coronary artery disease include diabetes mellitus, dyslipidemia, hypertension and male sex. When asked about current treatments, none were reported. He is compliant with treatment none of the time. His breakfast blood glucose range is generally >200 mg/dl. (Pt does not check blood sugars regularly but does some times and states they are >200 ) An ACE inhibitor/angiotensin II receptor blocker is not being taken. Eye exam is current (Sept 2015).  Hypertension This is a chronic problem. The problem is uncontrolled. Pertinent negatives include no anxiety, chest pain, headaches, palpitations, peripheral edema or shortness of breath. Risk factors for coronary artery disease include dyslipidemia and male gender. Past treatments include diuretics and calcium channel blockers. The current treatment provides mild improvement. Hypertensive end-organ damage includes kidney disease. There is no history of CAD/MI, CVA, heart failure  or a thyroid problem. There is no history of sleep apnea.  Hyperlipidemia This is a chronic problem. The current episode started more than 1 year ago. The problem is uncontrolled. Recent lipid tests were reviewed and are high. Exacerbating diseases include diabetes. Pertinent negatives include no chest pain, leg pain, myalgias or shortness of breath. Current antihyperlipidemic treatment includes diet change. The current treatment provides no improvement of lipids. Risk factors for coronary artery disease include diabetes mellitus, dyslipidemia, male sex and hypertension.      Review of Systems  Constitutional: Negative.   HENT: Negative.   Respiratory: Negative.  Negative for shortness of breath.   Cardiovascular: Negative.  Negative for chest pain and palpitations.  Gastrointestinal: Negative.   Endocrine: Negative.   Genitourinary: Negative.   Musculoskeletal: Negative.  Negative for myalgias.  Neurological: Negative.  Negative for dizziness and headaches.  Hematological: Negative.   Psychiatric/Behavioral: Negative.  Negative for confusion.  All other systems reviewed and are negative.      Objective:   Physical Exam  Constitutional: He is oriented to person, place, and time. He appears well-developed and well-nourished. No distress.  HENT:  Head: Normocephalic.  Right Ear: External ear normal.  Left Ear: External ear normal.  Nose: Nose normal.  Mouth/Throat: Oropharynx is clear and moist.  Eyes: Pupils are equal, round, and reactive to light. Right eye exhibits no discharge. Left eye exhibits no discharge.  Neck: Normal range of motion. Neck supple. No thyromegaly present.  Cardiovascular: Normal rate, regular rhythm, normal heart sounds and intact distal pulses.   No murmur heard. Pulmonary/Chest: Effort normal and breath sounds normal. No respiratory distress. He has no wheezes.  Abdominal: Soft. Bowel sounds are normal. He exhibits no distension. There is no tenderness.  Musculoskeletal: Normal range of motion. He exhibits no edema or tenderness.  Neurological: He is alert and oriented to person, place, and time. He has normal reflexes. No cranial nerve deficit.  Skin: Skin is warm and dry. No rash noted. No erythema.  Psychiatric: He has a normal mood and affect. His behavior is normal. Judgment and thought content normal.  Vitals reviewed.   BP 171/83 mmHg  Pulse 69  Temp(Src) 97.5 F (36.4 C) (Oral)  Ht _0  (1.6 m)  Wt 133 lb (60.328 kg)  BMI 23.57 kg/m2       Assessment & Plan:  1. Essential hypertension -Pt's metoprolol reordered - CMP14+EGFR - hydrochlorothiazide (HYDRODIURIL) 25 MG tablet; Take 1 tablet (25 mg total) by mouth daily.  Dispense: 90 tablet; Refill: 3 - amLODipine (NORVASC) 10 MG tablet; Take 1 tablet (10 mg total) by mouth daily.  Dispense: 90 tablet; Refill: 3 - metoprolol (LOPRESSOR) 50 MG tablet; Take 1 tablet (50 mg total) by mouth 2 (two) times daily.  Dispense: 180 tablet; Refill: 3  2. Type 2 diabetes mellitus with diabetic nephropathy -Pt's lantus and januvia reordered -Pt to make appt with Tammy RTO in 2 weeks - POCT glycosylated hemoglobin (Hb A1C) - POCT UA - Microalbumin - CMP14+EGFR - sitaGLIPtin (JANUVIA) 25 MG tablet; Take 1 tablet (25 mg total) by mouth daily.  Dispense: 90 tablet; Refill: 1 - insulin glargine (LANTUS) 100 UNIT/ML injection; Inject 0.25 mLs (25 Units total) into the skin daily.  Dispense: 30 mL; Refill: 2  3. HLD (hyperlipidemia) -I reordered pt's Crestor - CMP14+EGFR - Lipid panel  4. Diabetic retinopathy associated with diabetes mellitus due to underlying condition, macular edema presence unspecified, with unspecified retinopathy severity - CMP14+EGFR   Continue all meds Labs pending- Pt states he would like to be called on lab results on Monday, Wednesday, or Friday Health Maintenance reviewed Diet and exercise encouraged RTO 2 weeks   Evelina Dun, FNP

## 2015-03-29 ENCOUNTER — Telehealth: Payer: Self-pay | Admitting: *Deleted

## 2015-03-29 LAB — CMP14+EGFR
ALT: 12 IU/L (ref 0–44)
AST: 16 IU/L (ref 0–40)
Albumin/Globulin Ratio: 1.6 (ref 1.1–2.5)
Albumin: 3.6 g/dL (ref 3.5–5.5)
Alkaline Phosphatase: 77 IU/L (ref 39–117)
BUN/Creatinine Ratio: 12 (ref 9–20)
BUN: 38 mg/dL — AB (ref 6–24)
CHLORIDE: 101 mmol/L (ref 97–108)
CO2: 19 mmol/L (ref 18–29)
Calcium: 8.4 mg/dL — ABNORMAL LOW (ref 8.7–10.2)
Creatinine, Ser: 3.25 mg/dL (ref 0.76–1.27)
GFR calc Af Amer: 24 mL/min/{1.73_m2} — ABNORMAL LOW (ref >59)
GFR calc non Af Amer: 21 mL/min/{1.73_m2} — ABNORMAL LOW (ref >59)
GLUCOSE: 228 mg/dL — AB (ref 65–99)
Globulin, Total: 2.2 g/dL (ref 1.5–4.5)
Potassium: 4.3 mmol/L (ref 3.5–5.2)
Sodium: 138 mmol/L (ref 134–144)
TOTAL PROTEIN: 5.8 g/dL — AB (ref 6.0–8.5)

## 2015-03-29 LAB — LIPID PANEL
CHOLESTEROL TOTAL: 254 mg/dL — AB (ref 100–199)
Chol/HDL Ratio: 5.8 ratio units — ABNORMAL HIGH (ref 0.0–5.0)
HDL: 44 mg/dL (ref >39)
LDL Calculated: 133 mg/dL — ABNORMAL HIGH (ref 0–99)
TRIGLYCERIDES: 384 mg/dL — AB (ref 0–149)
VLDL CHOLESTEROL CAL: 77 mg/dL — AB (ref 5–40)

## 2015-03-29 LAB — MICROALBUMIN, URINE: Microalbumin, Urine: 3487.1 ug/mL

## 2015-03-29 NOTE — Telephone Encounter (Signed)
We attempted to call pt- after a call report that creatinine was 3.25.  No answer no VM  Please address with pt Monday

## 2015-03-31 ENCOUNTER — Telehealth: Payer: Self-pay | Admitting: Family

## 2015-04-01 ENCOUNTER — Encounter (HOSPITAL_COMMUNITY): Payer: Self-pay | Admitting: Emergency Medicine

## 2015-04-01 ENCOUNTER — Other Ambulatory Visit (HOSPITAL_COMMUNITY)
Admission: RE | Admit: 2015-04-01 | Discharge: 2015-04-01 | Disposition: A | Discharge status: Home / Self Care | Payer: Managed Care, Other (non HMO) | Source: Ambulatory Visit | Attending: *Deleted | Admitting: *Deleted

## 2015-04-01 ENCOUNTER — Emergency Department (HOSPITAL_COMMUNITY)
Admission: EM | Admit: 2015-04-01 | Discharge: 2015-04-01 | Disposition: A | Discharge status: Home / Self Care | Payer: PRIVATE HEALTH INSURANCE | Attending: Emergency Medicine | Admitting: Emergency Medicine

## 2015-04-01 DIAGNOSIS — H5442 Blindness, left eye, normal vision right eye: Secondary | ICD-10-CM | POA: Insufficient documentation

## 2015-04-01 DIAGNOSIS — N179 Acute kidney failure, unspecified: Secondary | ICD-10-CM | POA: Diagnosis not present

## 2015-04-01 DIAGNOSIS — E119 Type 2 diabetes mellitus without complications: Secondary | ICD-10-CM | POA: Diagnosis not present

## 2015-04-01 DIAGNOSIS — N289 Disorder of kidney and ureter, unspecified: Secondary | ICD-10-CM

## 2015-04-01 DIAGNOSIS — N189 Chronic kidney disease, unspecified: Secondary | ICD-10-CM | POA: Insufficient documentation

## 2015-04-01 DIAGNOSIS — Z98811 Dental restoration status: Secondary | ICD-10-CM | POA: Insufficient documentation

## 2015-04-01 DIAGNOSIS — I129 Hypertensive chronic kidney disease with stage 1 through stage 4 chronic kidney disease, or unspecified chronic kidney disease: Secondary | ICD-10-CM | POA: Diagnosis not present

## 2015-04-01 DIAGNOSIS — Z862 Personal history of diseases of the blood and blood-forming organs and certain disorders involving the immune mechanism: Secondary | ICD-10-CM | POA: Diagnosis not present

## 2015-04-01 DIAGNOSIS — Z79899 Other long term (current) drug therapy: Secondary | ICD-10-CM | POA: Diagnosis not present

## 2015-04-01 DIAGNOSIS — R7989 Other specified abnormal findings of blood chemistry: Secondary | ICD-10-CM | POA: Diagnosis present

## 2015-04-01 LAB — COMPREHENSIVE METABOLIC PANEL
ALBUMIN: 3.7 g/dL (ref 3.5–5.0)
ALT: 15 U/L — ABNORMAL LOW (ref 17–63)
ANION GAP: 8 (ref 5–15)
AST: 19 U/L (ref 15–41)
Alkaline Phosphatase: 74 U/L (ref 38–126)
BUN: 52 mg/dL — ABNORMAL HIGH (ref 6–20)
CHLORIDE: 108 mmol/L (ref 101–111)
CO2: 18 mmol/L — AB (ref 22–32)
Calcium: 8.2 mg/dL — ABNORMAL LOW (ref 8.9–10.3)
Creatinine, Ser: 3.27 mg/dL — ABNORMAL HIGH (ref 0.61–1.24)
GFR calc Af Amer: 23 mL/min — ABNORMAL LOW (ref >60)
GFR calc non Af Amer: 20 mL/min — ABNORMAL LOW (ref >60)
GLUCOSE: 181 mg/dL — AB (ref 65–99)
POTASSIUM: 4.5 mmol/L (ref 3.5–5.1)
SODIUM: 134 mmol/L — AB (ref 135–145)
Total Bilirubin: 0.6 mg/dL (ref 0.3–1.2)
Total Protein: 7 g/dL (ref 6.5–8.1)

## 2015-04-01 LAB — CBC WITH DIFFERENTIAL/PLATELET
BASOS ABS: 0 10*3/uL (ref 0.0–0.1)
Basophils Relative: 1 % (ref 0–1)
Eosinophils Absolute: 0.2 10*3/uL (ref 0.0–0.7)
Eosinophils Relative: 4 % (ref 0–5)
HEMATOCRIT: 36.1 % — AB (ref 39.0–52.0)
Hemoglobin: 12.6 g/dL — ABNORMAL LOW (ref 13.0–17.0)
LYMPHS ABS: 1.2 10*3/uL (ref 0.7–4.0)
Lymphocytes Relative: 19 % (ref 12–46)
MCH: 28.8 pg (ref 26.0–34.0)
MCHC: 34.9 g/dL (ref 30.0–36.0)
MCV: 82.6 fL (ref 78.0–100.0)
MONOS PCT: 4 % (ref 3–12)
Monocytes Absolute: 0.3 10*3/uL (ref 0.1–1.0)
NEUTROS ABS: 4.6 10*3/uL (ref 1.7–7.7)
Neutrophils Relative %: 72 % (ref 43–77)
Platelets: 274 10*3/uL (ref 150–400)
RBC: 4.37 MIL/uL (ref 4.22–5.81)
RDW: 13.3 % (ref 11.5–15.5)
WBC: 6.3 10*3/uL (ref 4.0–10.5)

## 2015-04-01 LAB — CBG MONITORING, ED: Glucose-Capillary: 173 mg/dL — ABNORMAL HIGH (ref 65–99)

## 2015-04-01 NOTE — ED Notes (Signed)
Patient with no complaints at this time. Respirations even and unlabored. Skin warm/dry. Discharge instructions reviewed with patient at this time. Patient given opportunity to voice concerns/ask questions. IV removed per policy and band-aid applied to site. Patient discharged at this time and left Emergency Department with steady gait.  

## 2015-04-01 NOTE — Telephone Encounter (Signed)
Patients daughter aware that patient needs to go to the ED.

## 2015-04-01 NOTE — ED Notes (Signed)
Abnormal labs from [facility], sent by office.

## 2015-04-01 NOTE — Telephone Encounter (Signed)
Was pt's lab work discussed with patient yet? I advised him to go to ED because of creatinine levels.

## 2015-04-01 NOTE — Discharge Instructions (Signed)
Enfermedad renal crnica. (Chronic Kidney Disease) La enfermedad renal crnica se produce cuando los riones sufren un dao durante un largo perodo. Los riones son dos rganos que se encuentran a cada lado de la columna vertebral entre el centro de la espalda y la parte anterior del abdomen. Los riones:   [employer] y el exceso de agua de la [place].  Producen hormonas importantes. Ayudan a [employer], a regular la presin arterial y a formar glbulos rojos.  Regulan los lquidos y los productos qumicos en la sangre y los tejidos. Un dao pequeo puede no causar problemas, pero un dao importante puede impedir que los riones funcionen de la [place]. Si no se toman medidas para frenar el dao renal o evitar que empeore, los riones pueden dejar de funcionar de forma [place]. La mayora de las veces, la enfermedad renal crnica no se cura. Sin embargo, muchas veces se puede Chief Technology Officer, y los que la sufren, por lo general pueden llevar una vida normal. CAUSAS  Las causas ms comunes de la enfermedad renal crnica son la diabetes y la presin arterial alta (hipertensin). Otras causas de enfermedad renal crnica pueden ser:   [employer] filtros del rin.  Enfermedades que afectan el sistema inmunolgico.  Enfermedades genticas.  Medicamentos que daan los riones, como los antiinflamatorios no esteroides [place]).  Envenenamiento o exposicin a sustancias txicas.  Una infeccin urinaria o renal recurrente.  Problemas en el flujo de orina. Las causas pueden ser:  Hotel manager.  Clculos en el rin.  La prstata agrandada en los hombres. SIGNOS Y SNTOMAS  Debido a que el dao renal en la enfermedad renal crnica se produce gradualmente, los sntomas se desarrollan lentamente y pueden no ser evidentes hasta que el dao renal es grave. Una persona puede tener una enfermedad renal durante aos sin mostrar ningn sntoma. Los sntomas  pueden ser:   Hinchazn (edema) de las piernas, tobillos o pies.  Cansancio (letargo).  Nuseas o vmitos.  Confusin.  Problemas en la miccin, tales como:  Disminucin de la produccin de [place].  Aumento de los deseos de orinar, especialmente por la noche.  Accidentes frecuentes en los nios que estn entrenando el control de esfnteres.  Contracciones o calambres musculares.  Falta de aire.  Debilidad.  Picazn persistente.  Prdida del apetito.  Gusto metlico en la boca  Dificultad para dormir.  Movimientos lentos en los nios.  [place] [employer] DIAGNSTICO  La enfermedad renal crnica se Emergency planning/management officer y diagnosticar mediante estudios que incluyen anlisis de [place], de [place], diagnsticos por imgenes, o una biopsia de rin.  TRATAMIENTO  La mayora de las enfermedades renales crnicas no pueden curarse. El tratamiento generalmente incluye el alivio de los sntomas y prevenir o Lexicographer la progresin de la enfermedad. El tratamiento puede incluir:   Una dieta especial. Es posible que tenga que evitar el alcohol y los alimentos con alto contenido de sal y Field seismologist.  Medicamentos. Pueden ser:  Controlar la presin arterial.  Mejorar la anemia.  Mejorar la hinchazn.  Proteger los [employer]. INSTRUCCIONES PARA EL CUIDADO EN EL HOGAR   Siga la dieta que le han indicado.  Tome los medicamentos solamente como se lo haya indicado el mdico. No utilice ningn medicamento nuevo (recetado, de venta libre o suplementos nutricionales) excepto si el mdico lo aprueba. Muchos medicamentos pueden empeorar la enfermedad renal, o ser necesario ajustar las dosis.  Si fuma, deje de hacerlo. Converse con su mdico acerca de  los programas para dejar de fumar.  Concurra a todas las visitas de control como se lo haya indicado el mdico. SOLICITE ATENCIN MDICA DE INMEDIATO SI:  Empeora o desarrolla nuevos sntomas.  Aparecen sntomas de enfermedad renal en  etapa terminal. Estos incluyen:  Dolores de cabeza.  La piel se oscurece o se aclara de manera anormal.  Entumecimiento en las manos o en los pies.  Aparecen hematomas con facilidad.  Hipo frecuente.  Se detiene la menstruacin.  Tiene fiebre.  Disminuye la produccin de [place].  Siente dolor o tiene sangrado al [employer]. ASEGRESE DE QUE:  Comprende estas instrucciones.  Controlar su afeccin.  Recibir ayuda de inmediato si no mejora o si empeora. PARA OBTENER MS INFORMACIN   Asociacin Americana de Pacientes Renales (American Association of Kidney Patients, AAKP): BombTimer.gl  [place]): www.kidney.org  Fondo Americano para Problemas Renales ([place]): https://mathis.com/  Programa de rehabilitacin de Life Options: www.lifeoptions.org y www.kidneyschool.org Document Released: 10/01/2008 Document Revised: 11/19/2013 [employer] Patient Information 2015 [place], [place]. This information is not intended to replace advice given to you by your health care provider. Make sure you discuss any questions you have with your health care provider.

## 2015-04-01 NOTE — ED Notes (Signed)
Patient ambulatory to restroom  ?

## 2015-04-01 NOTE — ED Provider Notes (Signed)
CSN: [id number]     Arrival date & time 04/01/15  1224 History   First MD Initiated Contact with Patient 04/01/15 1348     Chief Complaint  Patient presents with  . Abnormal Lab    creatinine 3.25     (Consider location/radiation/quality/duration/timing/severity/associated sxs/prior Treatment) HPI Comments: Patient presents for evaluation of abnormal labs. Patient does report that he has a history of kidney disease. He had routine labs performed at the clinic and was told his kidney labs were worse. He was sent to the ER for further evaluation. Patient has no complaints. He has been urinating normally. No back or flank pain.   Past Medical History  Diagnosis Date  . Diabetes mellitus without complication   . Hyperlipidemia   . Hypertension   . Full dentures   . Wears glasses   . Blind left eye   . Chronic kidney disease   . Anemia    Past Surgical History  Procedure Laterality Date  . Eyes      detached retina-lt  . Eye surgery      both eyes-retine  . Colonoscopy     Family History  Problem Relation Age of Onset  . Diabetes Mother   . Diabetes Sister   . Esophageal cancer Neg Hx   . Colon cancer Neg Hx   . Rectal cancer Neg Hx   . Stomach cancer Neg Hx    Social History  Substance Use Topics  . Smoking status: Never Smoker   . Smokeless tobacco: Never Used  . Alcohol Use: No    Review of Systems  Respiratory: Negative.   Cardiovascular: Negative.   All other systems reviewed and are negative.     Allergies  Review of patient's allergies indicates no known allergies.  Home Medications   Prior to Admission medications   Medication Sig Start Date End Date Taking? Authorizing Provider  amLODipine (NORVASC) 10 MG tablet Take 1 tablet (10 mg total) by mouth daily. 03/28/15  Yes Sharion Balloon, FNP  Cholecalciferol (VITAMIN D) 1000 UNITS capsule Take 1 capsule (1,000 Units total) by mouth daily. 06/24/14  Yes Tammy Eckard, PHARMD  hydrochlorothiazide  (HYDRODIURIL) 25 MG tablet Take 1 tablet (25 mg total) by mouth daily. 03/28/15  Yes Sharion Balloon, FNP  insulin glargine (LANTUS) 100 UNIT/ML injection Inject 0.25 mLs (25 Units total) into the skin daily. 03/28/15   Sharion Balloon, FNP  Insulin Syringes, Disposable, U-100 0.5 ML MISC Use to inject insulin once daily. Patient not taking: Reported on 04/01/2015 06/19/14   Tammy Eckard, PHARMD  metoprolol (LOPRESSOR) 50 MG tablet Take 1 tablet (50 mg total) by mouth 2 (two) times daily. Patient not taking: Reported on 04/01/2015 03/28/15   Sharion Balloon, FNP  sitaGLIPtin (JANUVIA) 25 MG tablet Take 1 tablet (25 mg total) by mouth daily. 03/28/15   Christy A Hawks, FNP   BP 170/88 mmHg  Pulse 60  Temp(Src) 98.4 F (36.9 C) (Oral)  Resp 13  Ht 5\' 3"  (1.6 m)  Wt 130 lb (58.968 kg)  BMI 23.03 kg/m2  SpO2 100% Physical Exam  Constitutional: He is oriented to person, place, and time. He appears well-developed and well-nourished. No distress.  HENT:  Head: Normocephalic and atraumatic.  Right Ear: Hearing normal.  Left Ear: Hearing normal.  Nose: Nose normal.  Mouth/Throat: Oropharynx is clear and moist and mucous membranes are normal.  Eyes: Conjunctivae and EOM are normal. Pupils are equal, round, and reactive to light.  Neck:  Normal range of motion. Neck supple.  Cardiovascular: Regular rhythm, S1 normal and S2 normal.  Exam reveals no gallop and no friction rub.   No murmur heard. Pulmonary/Chest: Effort normal and breath sounds normal. No respiratory distress. He exhibits no tenderness.  Abdominal: Soft. Normal appearance and bowel sounds are normal. There is no hepatosplenomegaly. There is no tenderness. There is no rebound, no guarding, no tenderness at McBurney's point and negative Murphy's sign. No hernia.  Musculoskeletal: Normal range of motion.  Neurological: He is alert and oriented to person, place, and time. He has normal strength. No cranial nerve deficit or sensory deficit.  Coordination normal. GCS eye subscore is 4. GCS verbal subscore is 5. GCS motor subscore is 6.  Skin: Skin is warm, dry and intact. No rash noted. No cyanosis.  Psychiatric: He has a normal mood and affect. His speech is normal and behavior is normal. Thought content normal.  Nursing note and vitals reviewed.   ED Course  Procedures (including critical care time) Labs Review Labs Reviewed  COMPREHENSIVE METABOLIC PANEL - Abnormal; Notable for the following:    Sodium 134 (*)    CO2 18 (*)    Glucose, Bld 181 (*)    BUN 52 (*)    Creatinine, Ser 3.27 (*)    Calcium 8.2 (*)    ALT 15 (*)    GFR calc non Af Amer 20 (*)    GFR calc Af Amer 23 (*)    All other components within normal limits  CBC WITH DIFFERENTIAL/PLATELET - Abnormal; Notable for the following:    Hemoglobin 12.6 (*)    HCT 36.1 (*)    All other components within normal limits  CBG MONITORING, ED - Abnormal; Notable for the following:    Glucose-Capillary 173 (*)    All other components within normal limits    Imaging Review No results found. I have personally reviewed and evaluated these images and lab results as part of my medical decision-making.   EKG Interpretation None      MDM   Final diagnoses:  None   acute on chronic renal insufficiency  Patient presents to the ER for evaluation of abnormal labs performed at the clinic. Patient is without complaints at arrival. He does report that he was off of his medications for some time this year because he lost his job. He is back on his medicines currently. Blood sugar appears to be reasonably well controlled. Creatinine is 3.25. Last creatinine in our system was 1.8. This does represent an increase, but patient does not require any immediate interventions. He has a nephrologist in [place]. Patient will be referred back to his nephrologist for further consideration.    Orpah Greek, MD 04/01/15 1534

## 2015-04-07 ENCOUNTER — Encounter: Payer: Self-pay | Admitting: Pharmacist

## 2015-04-07 ENCOUNTER — Ambulatory Visit (INDEPENDENT_AMBULATORY_CARE_PROVIDER_SITE_OTHER): Payer: Commercial Indemnity | Admitting: Pharmacist

## 2015-04-07 VITALS — BP 114/78 | HR 70 | Ht 63.0 in | Wt 132.0 lb

## 2015-04-07 DIAGNOSIS — E1165 Type 2 diabetes mellitus with hyperglycemia: Secondary | ICD-10-CM

## 2015-04-07 DIAGNOSIS — E118 Type 2 diabetes mellitus with unspecified complications: Secondary | ICD-10-CM

## 2015-04-07 DIAGNOSIS — IMO0002 Reserved for concepts with insufficient information to code with codable children: Secondary | ICD-10-CM

## 2015-04-07 DIAGNOSIS — I10 Essential (primary) hypertension: Secondary | ICD-10-CM

## 2015-04-07 DIAGNOSIS — E785 Hyperlipidemia, unspecified: Secondary | ICD-10-CM

## 2015-04-07 DIAGNOSIS — N184 Chronic kidney disease, stage 4 (severe): Secondary | ICD-10-CM | POA: Insufficient documentation

## 2015-04-07 MED ORDER — "INSULIN SYRINGE 31G X 5/16"" 0.5 ML MISC": Status: DC

## 2015-04-07 MED ORDER — ATORVASTATIN CALCIUM 10 MG PO TABS: 10.0000 mg | ORAL_TABLET | Freq: Every day | ORAL | Status: DC

## 2015-04-07 NOTE — Progress Notes (Signed)
Diabetes Follow-Up Visit Chief Complaint:   Chief Complaint  Patient presents with  . Diabetes     Filed Vitals:   04/07/15 0917  BP: 114/78  Pulse: 70     HPI: Craig Fisher is referred by Evelina Dun, NP to evaluate mixed dyslipidemia and diabetes.    Diabetes- Patient was recently seen by Evelina Dun, NP.  His DM was poorly controlled.  It was determined that patient was not taking many of his medications because he had not been working.  He had stopped both Lantus and Januvia.  He was suppose to take Lantus 25 units once daily and januvia 25mg  daily.   Home BG Monitoring:  Checking 1 times a day. Patient did not bring in glucometer to office today. Reports recent BG was been 180 to low 200's while out of insulin was 89 to 150 since restarting insulin  Low fat/carbohydrate diet?  Yes  Breakfast - oatmeal and fruit  Lunch - chicken and vegetables or soup  Supper - smoothie  Snacks - not currently but was eating lots of sweets prior to appt 03/28/2015  Beverages - he was drinking 1 or 2 sodas daily but stopped over the last week.  Nicotine Abuse?  No Medication Compliance?  No Exercise?  Yes Alcohol Abuse?  No  HTN - patient's BP is at goal today.  He is taking amlodipine 1-mg daily and HCTZ 25mg  daily  Stage 4 kidney failure  - patient is seeing nephrologist at [place].  He has follow up .  Patient's last serum creatinine was 3.27 Est Cr Cl = 21 Est GFR = 20  Hyperlipidemia - LDL and Triglycerides were not at goal - he had taken crestor and zetia in past but stopped due to cost.   Exam Edema:  negative   BMI:  Body mass index is 23.39 kg/(m^2).   Weight changes:  Increased by 2# over last week General Appearance:  alert, oriented, no acute distress and well nourished Mood/Affect:  normal **patient has interrupter -with him today**   Lab Results  Component Value Date   HGBA1C 10.8 03/28/2015    Lab Results  Component Value Date   [facility] 2708 03/09/2014   09/099/2016 LDL = 133 Tg = 384 Total Cholesterol = 254 HDL = 44   Assessment: 1.  Diabetes. - poorly controlled due to high cost of meds and non compliance 2.  Blood Pressure.  At goal today 3.  Lipids.  Not at goals 4.  Stage 4 Chronic kidney disease   Recommendations: 1.  Medication recommendations at this time are as follows:  Continue lantus 25 units daily  Remain off januvia  D/C HCTZ - not effective at cr cl less than 60mL/min.  BP was at goal today.  Continue amlodipine 10mg  take 1 tablet daily.  Start atorvastatin 10mg  take 1 tablet daily   2.  Reviewed HBG goals:  Fasting 80-130 and 1-2 hour post prandial <180.  Patient is instructed to check BG 1 times per day.   Reveiwed s/s of hypoglycemia and how to treat.  (Spanish handout given) Patient to call me for medication adjustment if he experiences hypoglycemia. 3.  BP goal < 140/85. 4.  LDL goal of < 100, HDL > 40 and TG < 150. 5.  Eye Exam yearly and Dental Exam every 6 months. 6.  Dietary recommendations:  Reveiwed CHO counting diet with patient (spanish handouts given) 8.  Return to clinic in 3 weeks -  Evelina Dun, NP and 2 months to follow up with me   Time spent counseling patient:  60 minutes  Referring provider: Burnis Medin, PharmD, CPP, CDE

## 2015-04-07 NOTE — Patient Instructions (Addendum)
Stop hydrocholorthiazide Start atorvastatin 10mg  - tome una tableta a dia for cholesterol   Diabetes and Standards of Medical Care   Diabetes is complicated. You may find that your diabetes team includes a dietitian, nurse, diabetes educator, eye doctor, and more. To help everyone know what is going on and to help you get the care you deserve, the following schedule of care was developed to help keep you on track. Below are the tests, exams, vaccines, medicines, education, and plans you will need.  Blood Glucose Goals Prior to meals = 80 - 130 Within 2 hours of the start of a meal = less than 180  HbA1c test (goal is less than 7.0% - your last value was 10.8%) This test shows how well you have controlled your glucose over the past 2 to 3 months. It is used to see if your diabetes management plan needs to be adjusted.   It is performed at least 2 times a year if you are meeting treatment goals.  It is performed 4 times a year if therapy has changed or if you are not meeting treatment goals.  Blood pressure test This test is performed at every routine medical visit. The goal is less than 140/90 mmHg for most people, but 130/80 mmHg in some cases. Ask your health care provider about your goal. Your blood pressure was 114/78 today. Dental exam  Follow up with the dentist regularly.  Eye exam  If you are diagnosed with type 1 diabetes as a child, get an exam upon reaching the age of 24 years or older and have had diabetes for 3 to 5 years. Yearly eye exams are recommended after that initial eye exam.  If you are diagnosed with type 1 diabetes as an adult, get an exam within 5 years of diagnosis and then yearly.  If you are diagnosed with type 2 diabetes, get an exam as soon as possible after the diagnosis and then yearly.  Foot care exam  Visual foot exams are performed at every routine medical visit. The exams check for cuts, injuries, or other problems with the feet.  A  comprehensive foot exam should be done yearly. This includes visual inspection as well as assessing foot pulses and testing for loss of sensation.  Check your feet nightly for cuts, injuries, or other problems with your feet. Tell your health care provider if anything is not healing.  Kidney function test (urine microalbumin)  This test is performed once a year.  Type 1 diabetes: The first test is performed 5 years after diagnosis.  Type 2 diabetes: The first test is performed at the time of diagnosis.  A serum creatinine and estimated glomerular filtration rate (eGFR) test is done once a year to assess the level of chronic kidney disease (CKD), if present.  Lipid profile (cholesterol, HDL, LDL, triglycerides)  Performed every 5 years for most people.  The goal for LDL is less than 100 mg/dL. If you are at high risk, the goal is less than 70 mg/dL.  The goal for HDL is 40 mg/dL to 50 mg/dL for men and 50 mg/dL to 60 mg/dL for women. An HDL cholesterol of 60 mg/dL or higher gives some protection against heart disease.  The goal for triglycerides is less than 150 mg/dL.  Influenza vaccine, pneumococcal vaccine, and hepatitis B vaccine  The influenza vaccine is recommended yearly.  The pneumococcal vaccine is generally given once in a lifetime. However, there are some instances when another vaccination is recommended.  Check with your health care provider.  The hepatitis B vaccine is also recommended for adults with diabetes.  Diabetes self-management education  Education is recommended at diagnosis and ongoing as needed.  Treatment plan  Your treatment plan is reviewed at every medical visit.  Document Released: 05/02/2009 Document Revised: 03/07/2013 Document Reviewed: 12/05/2012 [facility] Patient Information 2014 [place].  Hipoglucemia (Hypoglycemia) La hipoglucemia se produce cuando el nivel de glucosa en la sangre es demasiado bajo. La glucosa es un tipo de  azcar, que es la principal fuente de energa del cuerpo. Hormonas, como la insulina y Secretary/administrator, Probation officer el nivel de glucosa en la [place]. La insulina reduce el nivel de la glucosa en la sangre, mientras que el glucagn lo [place]. Si tiene demasiada insulina en el torrente sanguneo o si no ingiere suficientes alimentos que contengan azcar, puede desarrollar hipoglucemia. Esta afeccin puede manifestarse en personas con o sin diabetes. Puede desarrollarse rpidamente y, como consecuencia, necesitar atencin urgente.  CAUSAS   Omitir o retrasar comidas.  No ingerir demasiados carbohidratos en las comidas.  Consumo excesivo de medicamentos para la diabetes.  No coordinar el horario de la toma de medicamentos por va oral para la diabetes o de insulina, con las comidas, las colaciones y Adult nurse.  Nuseas y vmitos.  Algunos medicamentos.  Enfermedades graves, como hepatitis, trastornos renales y ciertos trastornos de Youth worker.  Aumento de la actividad fsica o el ejercicio, sin ingerir alimentos adicionales o ajustar los medicamentos.  Beber alcohol en exceso.  Un trastorno nervioso que afecta las funciones corporales, como la frecuencia cardaca, presin arterial y digestin (neuropata [place]).  Una afeccin en la cual los msculos del estmago no funcionan apropiadamente (gastroparesia). Por consiguiente, los medicamentos y los alimentos no pueden absorberse Personal assistant.  Pocas veces un tumor de pncreas puede producir demasiada insulina. SNTOMAS   Hambre.  Sudoraciones (diaforesis).  Cambio en la Firefighter.  Temblores.  Dolor de [place].  Ansiedad.  Aturdimiento.  Irritabilidad.  Dificultad para concentrarse.  Sequedad en la boca.  Hormigueo o adormecimiento de las manos y los pies.  Sueo agitado o alteraciones del sueo.  Alteracin en el habla y la coordinacin.  Cambio en el estado mental.  Convulsiones breves o  prolongadas.  Agresividad  Somnolencia (letargo).  Debilidad.  Aumento de la frecuencia cardaca o palpitaciones.  Confusin.  Piel plida o de [employer].  Visin borrosa o doble.  Desmayos. DIAGNSTICO  Le harn un examen fsico y [place] historia clnica. Su mdico puede hacer un diagnstico en funcin de sus sntomas. Pueden realizarle anlisis de sangre y otras pruebas de laboratorio para Physicist, medical diagnstico. Una vez realizado el diagnstico, su mdico observar si los signos y sntomas desaparecen, una vez que aumenta el nivel de la glucosa en la [place].  TRATAMIENTO  Por lo general, la hipoglucemia puede tratarse fcilmente cuando se observan sntomas.  Controle su nivel de glucosa en la sangre. Si es menor que 70 mg/dl, tome uno de los siguientes:  3 o 4 comprimidos de glucosa.   taza de jugo.   taza de una gaseosa comn.  [place]   a 1 pomo de glucosa en gel.  5 a 6 caramelos duros.  Evite las bebidas o los alimentos con alto contenido de grasa, que pueden retrasar el aumento de los niveles de glucosa en la [place].  No ingiera ms de la cantidad recomendada de alimentos, bebidas, gel o comprimidos que contengan azcar. Si lo hace, el  nivel de glucosa en la sangre subir demasiado.  Espere de 10 a 15 minutos y vuelva a Chief Technology Officer su nivel de glucosa en la sangre. Si an es Scientist, product/process development 70 mg/dl o est por debajo del intervalo indicado, repita el tratamiento.  Ingiera una colacin si falta ms de 1 hora para su prxima comida. Es posible que, alguna vez, su nivel de glucosa en la sangre baje [place], de modo que no pueda tratarse en su casa, cuando comience a observar los sntomas. Probablemente necesite ayuda. Incluso puede desmayarse o ser incapaz de tragar. Si no puede tratarse por s solo, alguien Software engineer al hospital.  INSTRUCCIONES PARA EL CUIDADO EN EL HOGAR  Si tiene diabetes, siga su plan de control de la diabetes:  Tome los  medicamentos segn las indicaciones.  Siga el plan de ejercicio.  Siga el plan de comidas. No saltee comidas. Coma a horario.  Controle su nivel de glucosa en la sangre peridicamente. Controle su nivel de glucosa en la sangre antes y despus de ejercitarse. Si hace ejercicio durante ms tiempo o de [employer] de lo habitual, asegrese de Chief Technology Officer su nivel de glucosa en la sangre con mayor frecuencia.  Use su pulsera o medalla de alerta mdica, que indica que usted tiene diabetes.  Identifique la causa de su hipoglucemia. Luego, desarrolle formas de prevenir la recurrencia de la hipoglucemia.  No tome un bao o una ducha caliente inmediatamente despus de una inyeccin de insulina.  Siempre lleve [employer]. Las pastillas de glucosa son fciles de Catering manager.  Si va a beber alcohol, bbalo solo con las comidas.  Informe a familiares y amigos qu pueden hacer para mantenerlo seguro durante una convulsin. Esto puede incluir retirar [employer] duros o filosos del rea o colocarlo de costado.  Mantenga un peso saludable. SOLICITE ATENCIN MDICA SI:   Tiene problemas para Advertising account executive de glucosa en la sangre dentro del intervalo indicado.  Tiene episodios frecuentes de hipoglucemia.  Siente efectos secundarios por los medicamentos prescritos.  No est seguro por qu su nivel de glucosa en la sangre es tan bajo.  Nota cambios o un nuevo problema en la visin . SOLICITE ATENCIN MDICA DE INMEDIATO SI:   Presenta confusin.  Se produce un cambio en su estado mental.  Es incapaz de tragar.  Se desmaya. Document Released: 07/05/2005 Document Revised: 07/10/2013 [facility] Patient Information 2015 [place]. This information is not intended to replace advice given to you by your health care provider. Make sure you discuss any questions you have with your health care provider.

## 2015-04-09 ENCOUNTER — Ambulatory Visit: Payer: Commercial Indemnity | Admitting: Family

## 2015-04-24 ENCOUNTER — Ambulatory Visit (INDEPENDENT_AMBULATORY_CARE_PROVIDER_SITE_OTHER): Payer: Commercial Indemnity | Admitting: *Deleted

## 2015-04-24 DIAGNOSIS — Z23 Encounter for immunization: Secondary | ICD-10-CM

## 2015-04-28 ENCOUNTER — Ambulatory Visit: Payer: Commercial Indemnity | Admitting: Family

## 2015-05-06 ENCOUNTER — Ambulatory Visit (INDEPENDENT_AMBULATORY_CARE_PROVIDER_SITE_OTHER): Payer: Commercial Indemnity | Admitting: Family

## 2015-05-06 ENCOUNTER — Encounter: Payer: Self-pay | Admitting: Family

## 2015-05-06 ENCOUNTER — Encounter: Payer: Self-pay | Admitting: *Deleted

## 2015-05-06 VITALS — BP 161/80 | HR 55 | Temp 97.4°F | Ht 63.0 in | Wt 132.0 lb

## 2015-05-06 DIAGNOSIS — Z794 Long term (current) use of insulin: Secondary | ICD-10-CM | POA: Diagnosis not present

## 2015-05-06 DIAGNOSIS — E785 Hyperlipidemia, unspecified: Secondary | ICD-10-CM

## 2015-05-06 DIAGNOSIS — E1165 Type 2 diabetes mellitus with hyperglycemia: Secondary | ICD-10-CM | POA: Diagnosis not present

## 2015-05-06 DIAGNOSIS — N184 Chronic kidney disease, stage 4 (severe): Secondary | ICD-10-CM | POA: Diagnosis not present

## 2015-05-06 DIAGNOSIS — E118 Type 2 diabetes mellitus with unspecified complications: Secondary | ICD-10-CM | POA: Diagnosis not present

## 2015-05-06 DIAGNOSIS — I10 Essential (primary) hypertension: Secondary | ICD-10-CM

## 2015-05-06 DIAGNOSIS — Z1159 Encounter for screening for other viral diseases: Secondary | ICD-10-CM | POA: Diagnosis not present

## 2015-05-06 DIAGNOSIS — IMO0002 Reserved for concepts with insufficient information to code with codable children: Secondary | ICD-10-CM

## 2015-05-06 LAB — POCT UA - MICROALBUMIN: MICROALBUMIN (UR) POC: 50 mg/L

## 2015-05-06 LAB — POCT GLYCOSYLATED HEMOGLOBIN (HGB A1C): HEMOGLOBIN A1C: 8.4

## 2015-05-06 MED ORDER — INSULIN GLARGINE 100 UNIT/ML SOLOSTAR PEN: 25.0000 [IU] | PEN_INJECTOR | Freq: Every day | SUBCUTANEOUS | Status: DC

## 2015-05-06 MED ORDER — ASPIRIN EC 81 MG PO TBEC: 81.0000 mg | DELAYED_RELEASE_TABLET | Freq: Every day | ORAL | Status: DC

## 2015-05-06 NOTE — Addendum Note (Signed)
Addended by: Earlene Plater on: 05/06/2015 09:32 AM   Modules accepted: Orders

## 2015-05-06 NOTE — Progress Notes (Signed)
Subjective:    Patient ID: Craig Fisher, male    DOB: [birth year], 54 y.o.   MRN: [id number]  Pt presents to the office today to recheck DM 2 and HTN. 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 Pt was restarted on his la tus 25 units daily and to remain off [place]. PT's BP was at goal at this visit and was taken off HCTZ related to Cr Cl.  Diabetes He presents for his follow-up diabetic visit. He has type 2 diabetes mellitus. His disease course has been fluctuating. There are no hypoglycemic associated symptoms. Pertinent negatives for hypoglycemia include no confusion, dizziness, hunger, mood changes or sleepiness. Pertinent negatives for diabetes include no blurred vision, no foot paresthesias, no foot ulcerations and no visual change. There are no hypoglycemic complications. Symptoms are worsening. Risk factors for coronary artery disease include diabetes mellitus, dyslipidemia and male sex. Current diabetic treatment includes insulin injections. He is compliant with treatment most of the time. His breakfast blood glucose range is generally 110-130 mg/dl. An ACE inhibitor/angiotensin II receptor blocker is not being taken. Eye exam is not current.      Review of Systems  Constitutional: Negative.   HENT: Negative.   Eyes: Negative for blurred vision.  Respiratory: Negative.   Cardiovascular: Negative.   Gastrointestinal: Negative.   Endocrine: Negative.   Genitourinary: Negative.   Musculoskeletal: Negative.   Neurological: Negative.  Negative for dizziness.  Hematological: Negative.   Psychiatric/Behavioral: Negative.  Negative for confusion.  All other systems reviewed and are negative.      Objective:   Physical Exam  Constitutional: He is oriented to person, place, and time. He appears well-developed and well-nourished. No distress.  HENT:  Head: Normocephalic.  Right Ear: External ear normal.  Left Ear: External ear normal.  Nose: Nose normal.  Mouth/Throat: Oropharynx is  clear and moist.  Eyes: Pupils are equal, round, and reactive to light. Right eye exhibits no discharge. Left eye exhibits no discharge.  Neck: Normal range of motion. Neck supple. No thyromegaly present.  Cardiovascular: Normal rate, regular rhythm, normal heart sounds and intact distal pulses.   No murmur heard. Pulmonary/Chest: Effort normal and breath sounds normal. No respiratory distress. He has no wheezes.  Abdominal: Soft. Bowel sounds are normal. He exhibits no distension. There is no tenderness.  Musculoskeletal: Normal range of motion. He exhibits no edema or tenderness.  Neurological: He is alert and oriented to person, place, and time. He has normal reflexes. No cranial nerve deficit.  Skin: Skin is warm and dry. No rash noted. No erythema.  Psychiatric: He has a normal mood and affect. His behavior is normal. Judgment and thought content normal.  Vitals reviewed.     BP 173/72 mmHg  Pulse 55  Temp(Src) 97.4 F (36.3 C) (Oral)  Ht 5' 3"  (1.6 m)  Wt 132 lb (59.875 kg)  BMI 23.39 kg/m2     Assessment & Plan:  1. Uncontrolled type 2 diabetes mellitus with complication, with long-term current use of insulin ([place]) - Ambulatory referral to Ophthalmology - POCT glycosylated hemoglobin (Hb A1C) - POCT UA - Microscopic Only - BMP8+EGFR - Ambulatory referral to Nephrology  2. Chronic kidney disease (CKD) stage G4/A1, severely decreased glomerular filtration rate (GFR) between 15-29 mL/min/1.73 square meter and albuminuria creatinine ratio less than 30 mg/g (HCC) - BMP8+EGFR - Ambulatory referral to Nephrology  3. Essential hypertension -Pt states he has not taken his medication today- will recheck in 2 weeks - BMP8+EGFR - Ambulatory referral  to Nephrology  4. Need for hepatitis C screening test - Hepatitis C antibody - BMP8+EGFR  5. HLD (hyperlipidemia) - Lipid panel - BMP8+EGFR   Continue all meds Labs pending Health Maintenance reviewed Diet and exercise  encouraged RTO 2 weeks   Evelina Dun, FNP   Evelina Dun, [place]

## 2015-05-06 NOTE — Patient Instructions (Signed)
Diabetes mellitus tipo2, adultos (Type 2 Diabetes Mellitus, Adult) La diabetes mellitus tipo2, generalmente denominada diabetes tipo2, es una enfermedad prolongada (crnica). En la diabetes tipo2, el pncreas no produce suficiente insulina (una hormona), las clulas son menos sensibles a la insulina que se produce (resistencia a la insulina), o ambos. Normalmente, la [employer] azcares de los alimentos a las clulas de los tejidos. Las clulas de los tejidos [employer] azcares para Dealer. La falta de insulina o la falta de una respuesta normal a la insulina hace que el exceso de azcar se acumule en la sangre en lugar de Location manager en las clulas de los tejidos. Como resultado, se producen niveles altos de Dispensing optician (hiperglucemia). El efecto de los niveles altos de azcar (glucosa) puede causar muchas complicaciones.  La diabetes tipo2 antes tambin se denominaba diabetes del [place], pero puede ocurrir a Hotel manager.  [place] persona tiene mayor predisposicin a desarrollar diabetes tipo 2 si alguien en su familia tiene la enfermedad y tambin tiene uno o ms de los siguientes factores de riesgo principales:  Aumento de [place], sobrepeso u obesidad.  Estilo de vida sedentario.  Una historia de consumo constante de alimentos de altas caloras. Mantener un peso saludable y realizar actividad fsica regular puede reducir la probabilidad de desarrollar diabetes tipo 2. SNTOMAS  Una persona con diabetes tipo 2 no presenta sntomas en un principio. Los sntomas de la diabetes tipo 2 aparecen lentamente. Los sntomas son:  Aumento de la sed (polidipsia).  Aumento de la miccin (poliuria).  Orina con ms frecuencia durante la noche (nocturia).  Cambios repentinos en el peso o sin motivo aparente.  Infecciones frecuentes y recurrentes.  Cansancio (fatiga).  Debilidad.  Cambios en la visin, como visin borrosa.  Olor a Therapist, nutritional.  Dolor abdominal.  Nuseas o vmitos.  Cortes o moretones que tardan en sanarse.  Hormigueo o adormecimiento de las manos y los pies.  Una herida abierta en la piel (lcera). DIAGNSTICO Con frecuencia la diabetes tipo 2 no se diagnostica hasta que se presentan las complicaciones de la diabetes. La diabetes tipo 2 se diagnostica cuando los sntomas o las complicaciones se presentan y cuando aumentan los niveles de glucosa en la [place]. El nivel de glucosa en la sangre puede controlarse en uno o ms de los siguientes anlisis de sangre:  Medicin de glucosa en la sangre en [place]. No se le permitir comer durante al menos 8 horas antes de que se tome [place] de [place].  Pruebas al azar de glucosa en la sangre. El nivel de glucosa en la sangre se controla en cualquier momento del da sin importar el momento en que haya comido.  Prueba de A1c (hemoglobina glucosilada) Una prueba de A1c proporciona informacin sobre el control de la glucosa en la sangre durante los ltimos 3 meses.  Prueba de tolerancia a la glucosa oral (PTGO). La glucosa en la sangre se mide despus de no haber comido (ayunas) durante dos horas y despus de beber una bebida que contenga glucosa. TRATAMIENTO   Usted puede necesitar administrarse insulina o medicamentos para la diabetes todos los das para [employer] niveles de glucosa en la sangre en el rango deseado.  Si [place] insulina, tal vez necesite ajustar la dosis segn los carbohidratos que haya consumido en cada comida o colacin.  [employer] del tratamiento, se recomienda hacer cambios en el estilo de vida. Estos pueden incluir lo siguiente:  Building surveyor  plan de alimentacin personalizado elaborado por un nutricionista.  Hacer ejercicio fsico a diario. El mdico establecer los objetivos personalizados del tratamiento para usted en funcin de su edad, los medicamentos que toma, el tiempo que hace que tiene diabetes y cualquier otra enfermedad que  padezca. Generalmente, el objetivo del tratamiento es [employer] siguientes niveles sanguneos de glucosa:  Antes de las comidas (preprandial): de 80 a 130 mg/dl.  Antes de las comidas (preprandial): de 80 a 130 mg/dl.  A1c: menos del 6,5 % al 7 %. INSTRUCCIONES PARA EL CUIDADO EN EL HOGAR   Controle su nivel de hemoglobina A1c dos veces al ao.  Contrlese a diario Retail buyer de glucosa en la sangre segn las indicaciones de su mdico.  Supervise las cetonas en la orina cuando est enferma y segn las indicaciones de su [place] medicamento para la diabetes o adminstrese insulina segn las indicaciones de su mdico para Contractor nivel de glucosa en la sangre en el rango deseado.  Nunca se quede sin medicamento para la diabetes o sin insulina. Es necesario que la reciba [employer].  Si [place] insulina, tal vez deba ajustar la cantidad de insulina administrada segn los carbohidratos consumidos. Los hidratos de carbono pueden aumentar los niveles de glucosa en la sangre, pero deben incluirse en su dieta. Los hidratos de carbono aportan vitaminas, minerales y [place] que son Ardelia Mems parte esencial de una dieta saludable. Los hidratos de carbono se encuentran en frutas, verduras, cereales integrales, productos lcteos, legumbres y alimentos que contienen azcares aadidos.  Consuma alimentos saludables. Programe una cita con un nutricionista certificado para que lo ayude a Building services engineer de alimentacin adecuado para usted.  Baje de peso si es necesario.  Lleve una tarjeta de alerta mdica o use una pulsera o medalla de alerta mdica.  Lleve con usted una colacin de 15gramos de hidratos de carbono en todo momento para controlar los niveles bajos de glucosa en la sangre (hipoglucemia). Algunos ejemplos de colaciones de 15gramos de hidratos de carbono son los siguientes:  Tabletas de glucosa, 3 o 4.  Gel de glucosa, tubo de 15 gramos.  Pasas de uva, 2 cucharadas (24  gramos).  Caramelos de goma, 6.  Galletas de [place], 8.  Gaseosa comn, 4onzas (16mlilitros).  Pastillas de goma, 9.  Reconocer la hipoglucemia. La hipoglucemia se produce cuando los niveles de glucosa en la sangre son de 70 mg/dl o menos. El riesgo de hipoglucemia aumenta durante el ayuno o cuando se saltea las comidas, durante o despus de rOptometristejercicio intenso y mLa Platteduerme. Los sntomas de hipoglucemia son:  Temblores o sacudidas.  Disminucin de la capacidad de concentracin.  Sudoracin.  Aumento de la frecuencia cardaca.  Dolor de c[place]  Sequedad en la boca.  Hambre.  Irritabilidad.  Ansiedad.  Sueo agitado.  Alteracin del habla o de la coordinacin.  Confusin.  Tratar la hipoglucemia rpidamente. Si usted est alerta y puede tragar con seguridad, siga la regla de 15/15 que consiste en:  TMerck & Co15 y 20gramos de glucosa de accin rpida o carbohidratos. Las opciones de accin rpida son un gel de glucosa, tabletas de glucosa, o 4 onzas (120 ml) de jugo de frutas, gaseosa comn, o leche baja en grasa.  Compruebe su nivel de glucosa en la sangre 15 minutos despus de tomar la glucosa.  Tome entre 15 y 228gramos ms de glucosa si el nivel de glucosa en la sangre todava es de 751mdl o inferior.  Ingiera una comida o una colacin en el lapso de 1 hora una vez que los niveles de glucosa en la sangre vuelven a la normalidad.  Est atento a si siente mucha sed u orina con mayor frecuencia, porque son signos tempranos de hiperglucemia. El reconocimiento temprano de la hiperglucemia permite un tratamiento oportuno. Trate la hiperglucemia segn le indic su mdico.  Haga, al menos, 165mnutos de actividad fsica moderada a la semana, distribuidos en, por lo menos, 3das a la semana o como lo indique su mdico. Adems, debe realizar ejercicios de resistencia por lo menos 2veces a la semana o como lo indique su mdico. Trate de no permanecer  inactivo durante ms de 918mutos seguidos.  Ajuste su medicamento y la ingesta de alimentos, segn sea necesario, si inicia un nuevo ejercicio o deporte.  Siga su plan para los das de enfermedad cuando no puede comer o beber como de co[place] No consuma ningn producto que contenga tabaco, como cigarrillos, tabaco de maHigher education careers adviser ciPsychologist, sport and exerciseSi necesita ayuda para dejar de fumar, hable con el mdico.  Limite el consumo de alcohol a no ms de 1 medida por da en las mujeres no embarazadas y 2 medidas en los hombres. Debe beber alcohol solo mientras come. Hable con su mdico acerca de si el alcohol es seguro para usted. Informe a su mdico si bebe alcohol varias veces a la semana.  Concurra a todas las visitas de control como se lo haya indicado el mdico. Esto es importante.  Programe un examen de la vista poco despus del diagnstico de diabetes tipo 2 y luego anualmente.  Realice diariamente el cuidado de la piel y de los pies. Examine su piel y los pies diariamente para ver si tiene cortes, moretones, enrojecimiento, problemas en las uas, sangrado, ampollas o llPension scheme managerSu mdico debe hacerle un examen de los pies una vez por ao.  Cepllese los dientes y encas por lo menos dos veces al da y use hilo dental al menos una vez por da. Concurra regularmente a las visitas de control con el dentista.  Comparta su plan de control de diabetes en el trabajo o en la escuela.  MaShelbyvilleSe recomienda que se vacune contra la gripe todos los aoMesaAdems, que se vacune contra la neumona (vacuna antineumoccica). Si es mayor de 6579os y nunca se vaControl and instrumentation engineera neumona, esta vacuna puede administrarse como una serie de dos vacunas por separado. Pregntele al mdico qu otras vacunas se pueden recomendar.  Aprenda a maEngineer, maintenance (IT) Obtenga la mayor cantidad posible de informacin sobre la diabetes y solicite ayuda siempre que sea necesario.  Busque  programas de rehabilitacin y participe en ellos para mantener o mejorar su independencia y su calidad de vida. Solicite la derivacin a fisioterapia o terapia ocupacional si se le adCarMax la mano, o tiene problemas para asearse, vestirse, comer, o durante la acStone Citysica. SOLICITE ATENCIN MDICA SI:   No puede comer alimentos o beber por ms de 6 horas.  Tuvo nuseas o ha vomitado durante ms de 6 horas.  Su nivel de glucosa en la sangre es mayor de 240 mg/dl.  Presenta algn cambio en el estado mental.  Desarrolla una enfermedad grave adicional.  Tuvo diarrea durante ms de 6 horas.  Ha estado enfermo o ha tenido fiebre durante un par de das y no mejora.  Siente dolor al practicar cualquier actividad fsica. SOLICITE ATENCIN MDICA DE INMEDIATO SI:  Tiene dificultad para respirar.  Tiene niveles de cetonas moderados a altos.   Esta informacin no tiene Marine scientist el consejo del mdico. Asegrese de hacerle al mdico cualquier pregunta que tenga.   Document Released: 07/05/2005 Document Revised: 03/26/2015 Elsevier Interactive Patient Education 2016 [place]. Hipertensin (Hypertension) La hipertensin, conocida comnmente como presin arterial alta, se produce cuando la sangre bombea en las arterias con mucha fuerza. Las arterias son los vasos sanguneos que transportan la sangre desde el corazn hacia todas las partes del cuerpo. Una lectura de la presin arterial consiste en un nmero ms alto sobre un nmero ms bajo, por ejemplo, 110/72. El nmero ms alto (presin sistlica) corresponde a la presin interna de las arterias cuando el corazn [place]. El nmero ms bajo (presin diastlica) corresponde a la presin interna de las arterias cuando el corazn se relaja. En condiciones ideales, la presin arterial debe ser inferior a 120/80. La hipertensin fuerza al corazn a trabajar ms para Herbalist. Las arterias pueden estrecharse o  ponerse rgidas. La hipertensin no tratada o no controlada puede causar infarto de miocardio, ictus, enfermedad renal y otros problemas. [place] de riesgo de hipertensin son controlables, pero otros no lo son.  Entre los factores de riesgo que usted no puede Chief Technology Officer, se [employer] siguientes:   La raza. El riesgo es mayor para las Retail banker.  La edad. Los riesgos aumentan con la edad.  El sexo. Antes de los 45aos, los hombres corren ms [employer]. Despus de los 65aos, las mujeres corren ms [employer]. Entre los factores de riesgo que usted puede Chief Technology Officer, se [employer] siguientes:  No hacer la cantidad suficiente de actividad fsica o ejercicio.  Tener sobrepeso.  Consumir mucha grasa, azcar, caloras o sal en la dieta.  Beber alcohol en exceso. SIGNOS Y SNTOMAS Por lo general, la hipertensin no causa signos o sntomas. La hipertensin arterial demasiado alta (crisis hipertensiva) puede causar dolor de cabeza, ansiedad, falta de aire y hemorragia nasal. DIAGNSTICO Para detectar si usted tiene hipertensin, el mdico le medir la presin arterial mientras est sentado, con el brazo levantado a la altura del corazn. Debe medirla al [facility] veces en el mismo brazo. Determinadas condiciones pueden causar una diferencia de presin arterial entre el brazo izquierdo y Insurance underwriter. El hecho de tener una sola lectura de la presin arterial ms alta que lo normal no significa que Stage manager. Si no est claro si tiene hipertensin arterial, es posible que se le pida que regrese otro da para volver a controlarle la presin arterial. O bien se le puede pedir que se controle la presin arterial en su casa durante 1 o ms meses. [place] hipertensin arterial incluye hacer cambios en el estilo de vida y, posiblemente, tomar medicamentos. Un estilo de vida saludable puede ayudar a  bajar la presin arterial alta. Quiz deba cambiar algunos hbitos. Los cambios en el estilo de vida pueden incluir lo siguiente:  Seguir la dieta DASH. Esta dieta tiene un alto contenido de frutas, verduras y Psychologist, prison and probation services. Incluye poca cantidad de sal, carnes rojas y azcares agregados.  Mantenga el consumo de sodio por debajo de 2300 mg por da.  Realizar al [employer] 30 y 38 minutos de ejercicio [place], 4 veces por semana como mnimo.  Perder peso, si es necesario.  No fumar.  Limitar el consumo de bebidas alcohlicas.  Aprender formas de reducir el estrs.  El mdico puede recetarle medicamentos si los cambios en el estilo de vida no son suficientes para Child psychotherapist la presin arterial y si una de las siguientes afirmaciones es verdadera:  Fidel Levy 2 y 58 aos y su presin arterial sistlica est por encima de 140.  Tiene 60 aos o ms y su presin arterial sistlica est por encima de 150.  Su presin arterial diastlica est por encima de 90.  Tiene diabetes y su presin arterial sistlica est por encima de 140 o su presin arterial diastlica est por encima de 90.  Tiene una enfermedad renal y su presin arterial est por encima de 140/90.  Tiene una enfermedad cardaca y su presin arterial est por encima de 140/90. La presin arterial deseada puede variar en funcin de las enfermedades, la edad y otros factores personales. INSTRUCCIONES PARA EL CUIDADO EN EL HOGAR  Haga que le midan de nuevo la presin arterial segn las indicaciones del [place] los medicamentos solamente como se lo haya indicado el mdico. Siga cuidadosamente las indicaciones. Los medicamentos para la presin arterial deben tomarse segn las indicaciones. Los medicamentos pierden eficacia al omitir las dosis. El hecho de omitir las dosis tambin [place] el riesgo de otros problemas.  No fume.  Contrlese la presin arterial en su casa segn las indicaciones del  mdico. SOLICITE ATENCIN MDICA SI:   Piensa que tiene una reaccin alrgica a los medicamentos.  Tiene mareos o dolores de cabeza con Scientist, research (physical sciences).  Tiene hinchazn en los tobillos.  Tiene problemas de visin. SOLICITE ATENCIN MDICA DE INMEDIATO SI:  Siente un dolor de cabeza intenso o confusin.  Siente debilidad inusual, adormecimiento o que Geneticist, molecular.  Siente dolor intenso en el pecho o en el abdomen.  Vomita repetidas veces.  Tiene dificultad para respirar. ASEGRESE DE QUE:   Comprende estas instrucciones.  Controlar su afeccin.  Recibir ayuda de inmediato si no mejora o si empeora.   Esta informacin no tiene Marine scientist el consejo del mdico. Asegrese de hacerle al mdico cualquier pregunta que tenga.   Document Released: 07/05/2005 Document Revised: 11/19/2014 Elsevier Interactive Patient Education [employer].

## 2015-05-07 LAB — BMP8+EGFR
BUN / CREAT RATIO: 12 (ref 9–20)
BUN: 37 mg/dL — ABNORMAL HIGH (ref 6–24)
CO2: 17 mmol/L — ABNORMAL LOW (ref 18–29)
CREATININE: 3.07 mg/dL — AB (ref 0.76–1.27)
Calcium: 8.6 mg/dL — ABNORMAL LOW (ref 8.7–10.2)
Chloride: 109 mmol/L — ABNORMAL HIGH (ref 97–106)
GFR calc non Af Amer: 22 mL/min/{1.73_m2} — ABNORMAL LOW (ref >59)
GFR, EST AFRICAN AMERICAN: 25 mL/min/{1.73_m2} — AB (ref >59)
Glucose: 78 mg/dL (ref 65–99)
Potassium: 4.7 mmol/L (ref 3.5–5.2)
SODIUM: 142 mmol/L (ref 136–144)

## 2015-05-07 LAB — MICROALBUMIN, URINE: Microalbumin, Urine: 2273.6 ug/mL

## 2015-05-07 LAB — LIPID PANEL
Chol/HDL Ratio: 4.4 ratio units (ref 0.0–5.0)
Cholesterol, Total: 218 mg/dL — ABNORMAL HIGH (ref 100–199)
HDL: 50 mg/dL (ref >39)
LDL CALC: 142 mg/dL — AB (ref 0–99)
Triglycerides: 128 mg/dL (ref 0–149)
VLDL CHOLESTEROL CAL: 26 mg/dL (ref 5–40)

## 2015-05-07 LAB — HEPATITIS C ANTIBODY: Hep C Virus Ab: <0.1 s/co ratio (ref 0.0–0.9)

## 2015-05-08 ENCOUNTER — Other Ambulatory Visit: Payer: Self-pay | Admitting: Family

## 2015-05-08 MED ORDER — ATORVASTATIN CALCIUM 20 MG PO TABS: 20.0000 mg | ORAL_TABLET | Freq: Every day | ORAL | Status: DC

## 2015-05-20 ENCOUNTER — Encounter: Payer: Self-pay | Admitting: Family

## 2015-05-20 ENCOUNTER — Ambulatory Visit (INDEPENDENT_AMBULATORY_CARE_PROVIDER_SITE_OTHER): Payer: Commercial Indemnity | Admitting: Family

## 2015-05-20 VITALS — BP 154/73 | HR 64 | Temp 97.5°F | Ht 63.0 in | Wt 133.4 lb

## 2015-05-20 DIAGNOSIS — Z794 Long term (current) use of insulin: Secondary | ICD-10-CM | POA: Diagnosis not present

## 2015-05-20 DIAGNOSIS — I10 Essential (primary) hypertension: Secondary | ICD-10-CM | POA: Diagnosis not present

## 2015-05-20 DIAGNOSIS — E118 Type 2 diabetes mellitus with unspecified complications: Secondary | ICD-10-CM

## 2015-05-20 DIAGNOSIS — N184 Chronic kidney disease, stage 4 (severe): Secondary | ICD-10-CM | POA: Diagnosis not present

## 2015-05-20 DIAGNOSIS — E11319 Type 2 diabetes mellitus with unspecified diabetic retinopathy without macular edema: Secondary | ICD-10-CM | POA: Diagnosis not present

## 2015-05-20 DIAGNOSIS — E1165 Type 2 diabetes mellitus with hyperglycemia: Secondary | ICD-10-CM

## 2015-05-20 DIAGNOSIS — IMO0002 Reserved for concepts with insufficient information to code with codable children: Secondary | ICD-10-CM

## 2015-05-20 MED ORDER — INSULIN PEN NEEDLE 31G X 4 MM MISC: 1.0000 "application " | Freq: Three times a day (TID) | Status: DC

## 2015-05-20 MED ORDER — METOPROLOL TARTRATE 50 MG PO TABS: ORAL_TABLET | ORAL | Status: DC

## 2015-05-20 NOTE — Addendum Note (Signed)
Addended by: Earlene Plater on: 05/20/2015 05:21 PM   Modules accepted: Miquel Dunn

## 2015-05-20 NOTE — Patient Instructions (Addendum)
Plan de alimentacin DASH (DASH Eating Plan) DASH es la sigla en ingls de "Enfoques Alimentarios para Detener la Hipertensin". El plan de alimentacin DASH ha demostrado bajar la presin arterial elevada (hipertensin). Los beneficios adicionales para la salud pueden incluir la disminucin del riesgo de diabetes mellitus tipo2, enfermedades cardacas e ictus. Este plan tambin puede ayudar a Horticulturist, commercial. QU DEBO SABER ACERCA DEL PLAN DE ALIMENTACIN DASH? Para el plan de alimentacin DASH, seguir las siguientes pautas generales:  Elija los alimentos con un valor porcentual diario de sodio de menos del 5% (segn figura en la etiqueta del alimento).  Use hierbas o aderezos sin sal, en lugar de sal de mesa o sal marina.  Consulte al mdico o farmacutico antes de usar sustitutos de la sal.  Coma productos con bajo contenido de sodio, cuya etiqueta suele decir "bajo contenido de sodio" o "sin agregado de sal".  Coma alimentos frescos.  Coma ms verduras, frutas y productos lcteos con bajo contenido de [place].  Elija los cereales integrales. Busque la palabra "integral" en Equities trader de la lista de ingredientes.  Elija el pescado y el pollo o el pavo sin piel ms a menudo que las carnes rojas. Limite el consumo de pescado, carne de ave y carne a 6onzas (170g) por Training and development officer.  Limite el consumo de dulces, postres, azcares y bebidas azucaradas.  Elija las grasas saludables para el corazn.  Limite el consumo de queso a 1onza (28g) por Training and development officer.  Consuma ms comida casera y menos de restaurante, de buf y comida rpida.  Limite el consumo de alimentos fritos.  Cocine los alimentos utilizando mtodos que no sean la fritura.  Limite las verduras enlatadas. Si las consume, enjuguelas bien para disminuir el sodio.  Cuando coma en un restaurante, pida que preparen su comida con menos sal o, en lo posible, sin nada de sal. QU ALIMENTOS PUEDO COMER? Pida ayuda a un nutricionista para  conocer las necesidades calricas individuales. Cereales Pan de salvado o integral. Arroz integral. Pastas de salvado o integrales. Quinua, trigo burgol y cereales integrales. Cereales con bajo contenido de sodio. Tortillas de harina de maz o de salvado. Pan de maz integral. Galletas saladas integrales. Galletas con bajo contenido de [place]. Vegetales Verduras frescas o congeladas (crudas, al vapor, asadas o grilladas). Jugos de tomate y verduras con contenido bajo o reducido de sodio. Pasta y salsa de tomate con contenido bajo o [place]. Verduras enlatadas con bajo contenido de sodio o reducido de sodio.  Lambert Mody Lambert Mody frescas, en conserva (en su jugo natural) o frutas congeladas. Carnes y otros productos con protenas Carne de res molida (al 85% o ms [place]), carne de res de animales alimentados con pastos o carne de res sin la grasa. Pollo o pavo sin piel. Carne de pollo o de [place]. Cerdo sin la grasa. Todos los pescados y frutos de mar. Huevos. Porotos, guisantes o lentejas secos. Frutos secos y semillas sin sal. Frijoles enlatados sin sal. Lcteos Productos lcteos con bajo contenido de grasas, como [place] o al 1%, quesos reducidos en grasas o al 2%, ricota con bajo contenido de grasas o [employer], o yogur natural con bajo contenido de [place]. Quesos con contenido bajo o reducido de sodio. Grasas y Naval architect en barra que no contengan grasas trans. [place] y alios para ensaladas livianos o reducidos en grasas (reducidos en sodio). [place]. Aceites de crtamo, oliva o canola. [place] natural de man o almendra. Otros Palom[place] de maz y pretzels sin sal.  Los artculos mencionados arriba pueden no ser [employer] de las bebidas o los alimentos recomendados. Comunquese con el nutricionista para conocer ms opciones. QU ALIMENTOS NO SE RECOMIENDAN? Cereales Pan blanco. Pastas blancas. Arroz blanco. Pan de maz refinado. Bagels y  croissants. Galletas saladas que contengan grasas trans. Vegetales Vegetales con crema o fritos. Verduras en [place]. Verduras enlatadas comunes. Pasta y salsa de tomate en lata comunes. Jugos comunes de tomate y de verduras. Lambert Mody Frutas secas. Fruta enlatada en almbar liviano o espeso. Jugo de frutas. Carnes y otros productos con protenas [place] de carne con Lobbyist. Costillas, alas de pollo, tocineta, salchicha, mortadela, salame, chinchulines, tocino, perros calientes, salchichas alemanas y embutidos envasados. Frutos secos y semillas con sal. Frijoles con sal en lata. Lcteos Leche entera o al 2%, crema, mezcla de [place] y crema, y queso crema. Yogur entero o endulzado. Quesos o queso azul con alto contenido de Physicist, medical. [place] no lcteas y coberturas batidas. Quesos procesados, quesos para untar o cuajadas. Condimentos Sal de cebolla y ajo, sal condimentada, sal de mesa y sal marina. Salsas en lata y envasadas. Salsa [place]. Salsa trtara. Salsa barbacoa. Salsa teriyaki. Salsa de soja, incluso la que tiene contenido reducido de [place]. Salsa de carne. Salsa de pescado. Salsa de [place]. Salsa rosada. Rbano picante. Ketchup y mostaza. Saborizantes y tiernizantes para carne. Caldo en cubitos. Salsa picante. Salsa tabasco. Adobos. Aderezos para tacos. Salsas. Grasas y aceites [place], [place] en barra, [place] de [place], [place], [place] clarificada y Wendee Copp de tocino. Aceites de coco, de palmiste o de palma. Aderezos comunes para ensalada. Otros Pickles y Peter. Palomitas de maz y pretzels con sal. Los artculos mencionados arriba pueden no ser [employer] de las bebidas y los alimentos que se Higher education careers adviser. Comunquese con el nutricionista para obtener ms informacin. DNDE Dolan Amen MS INFORMACIN? [place], del Pulmn y de Herbalist (National Heart, Lung, and [place]): travelstabloid.com     Esta informacin no tiene Marine scientist el consejo del mdico. Asegrese de hacerle al mdico cualquier pregunta que tenga.   Document Released: 06/24/2011 Document Revised: 07/26/2014 Elsevier Interactive Patient Education 2016 [employer]. Hipertensin (Hypertension) La hipertensin, conocida comnmente como presin arterial alta, se produce cuando la sangre bombea en las arterias con mucha fuerza. Las arterias son los vasos sanguneos que transportan la sangre desde el corazn hacia todas las partes del cuerpo. Una lectura de la presin arterial consiste en un nmero ms alto sobre un nmero ms bajo, por ejemplo, 110/72. El nmero ms alto (presin sistlica) corresponde a la presin interna de las arterias cuando el corazn [place]. El nmero ms bajo (presin diastlica) corresponde a la presin interna de las arterias cuando el corazn se relaja. En condiciones ideales, la presin arterial debe ser inferior a 120/80. La hipertensin fuerza al corazn a trabajar ms para Herbalist. Las arterias pueden estrecharse o ponerse rgidas. La hipertensin no tratada o no controlada puede causar infarto de miocardio, ictus, enfermedad renal y otros problemas. [place] de riesgo de hipertensin son controlables, pero otros no lo son.  Entre los factores de riesgo que usted no puede Chief Technology Officer, se [employer] siguientes:   La raza. El riesgo es mayor para las Retail banker.  La edad. Los riesgos aumentan con la edad.  El sexo. Antes de los 45aos, los hombres corren ms [employer]. Despus de los 65aos, las mujeres corren ms [employer]. Lyndal Pulley  los factores de riesgo que usted puede Chief Technology Officer, se [employer] siguientes:  No hacer la cantidad suficiente de actividad fsica o ejercicio.  Tener sobrepeso.  Consumir mucha grasa, azcar, caloras o sal en la dieta.  Beber alcohol en exceso. SIGNOS Y SNTOMAS Por  lo general, la hipertensin no causa signos o sntomas. La hipertensin arterial demasiado alta (crisis hipertensiva) puede causar dolor de cabeza, ansiedad, falta de aire y hemorragia nasal. DIAGNSTICO Para detectar si usted tiene hipertensin, el mdico le medir la presin arterial mientras est sentado, con el brazo levantado a la altura del corazn. Debe medirla al [facility] veces en el mismo brazo. Determinadas condiciones pueden causar una diferencia de presin arterial entre el brazo izquierdo y Insurance underwriter. El hecho de tener una sola lectura de la presin arterial ms alta que lo normal no significa que Stage manager. Si no est claro si tiene hipertensin arterial, es posible que se le pida que regrese otro da para volver a controlarle la presin arterial. O bien se le puede pedir que se controle la presin arterial en su casa durante 1 o ms meses. [place] hipertensin arterial incluye hacer cambios en el estilo de vida y, posiblemente, tomar medicamentos. Un estilo de vida saludable puede ayudar a bajar la presin arterial alta. Quiz deba cambiar algunos hbitos. Los cambios en el estilo de vida pueden incluir lo siguiente:  Seguir la dieta DASH. Esta dieta tiene un alto contenido de frutas, verduras y Psychologist, prison and probation services. Incluye poca cantidad de sal, carnes rojas y azcares agregados.  Mantenga el consumo de sodio por debajo de 2300 mg por da.  Realizar al [employer] 30 y 37 minutos de ejercicio [place], 4 veces por semana como mnimo.  Perder peso, si es necesario.  No fumar.  Limitar el consumo de bebidas alcohlicas.  Aprender formas de reducir el estrs. El mdico puede recetarle medicamentos si los cambios en el estilo de vida no son suficientes para Child psychotherapist la presin arterial y si una de las siguientes afirmaciones es verdadera:  Fidel Levy 48 y 50 aos y su presin arterial sistlica est por encima de 140.  Tiene 60  aos o ms y su presin arterial sistlica est por encima de 150.  Su presin arterial diastlica est por encima de 90.  Tiene diabetes y su presin arterial sistlica est por encima de 140 o su presin arterial diastlica est por encima de 90.  Tiene una enfermedad renal y su presin arterial est por encima de 140/90.  Tiene una enfermedad cardaca y su presin arterial est por encima de 140/90. La presin arterial deseada puede variar en funcin de las enfermedades, la edad y otros factores personales. INSTRUCCIONES PARA EL CUIDADO EN EL HOGAR  Haga que le midan de nuevo la presin arterial segn las indicaciones del [place] los medicamentos solamente como se lo haya indicado el mdico. Siga cuidadosamente las indicaciones. Los medicamentos para la presin arterial deben tomarse segn las indicaciones. Los medicamentos pierden eficacia al omitir las dosis. El hecho de omitir las dosis tambin [place] el riesgo de otros problemas.  No fume.  Contrlese la presin arterial en su casa segn las indicaciones del mdico. SOLICITE ATENCIN MDICA SI:   Piensa que tiene una reaccin alrgica a los medicamentos.  Tiene mareos o dolores de cabeza con Scientist, research (physical sciences).  Tiene hinchazn en los tobillos.  Tiene problemas de visin. SOLICITE ATENCIN MDICA DE INMEDIATO SI:  Siente un dolor de cabeza intenso  o confusin.  Siente debilidad inusual, adormecimiento o que Geneticist, molecular.  Siente dolor intenso en el pecho o en el abdomen.  Vomita repetidas veces.  Tiene dificultad para respirar. ASEGRESE DE QUE:   Comprende estas instrucciones.  Controlar su afeccin.  Recibir ayuda de inmediato si no mejora o si empeora.   Esta informacin no tiene Marine scientist el consejo del mdico. Asegrese de hacerle al mdico cualquier pregunta que tenga.   Document Released: 07/05/2005 Document Revised: 11/19/2014 Elsevier Interactive Patient Education [employer].

## 2015-05-20 NOTE — Progress Notes (Signed)
 Subjective:    Patient ID: Craig Fisher, male    DOB: 02/12/1961, 53 y.o.   MRN: [id number]  Pt presents to the office today to recheck HTN and recheck DM 2. Pt's BP is not at goal today.  Diabetes He presents for his follow-up diabetic visit. He has type 2 diabetes mellitus. His disease course has been fluctuating. Pertinent negatives for hypoglycemia include no confusion, dizziness or headaches. Associated symptoms include visual change. Pertinent negatives for diabetes include no chest pain, no foot paresthesias and no foot ulcerations. Symptoms are worsening. Diabetic complications include nephropathy. Pertinent negatives for diabetic complications include no CVA, heart disease or peripheral neuropathy. Risk factors for coronary artery disease include diabetes mellitus, dyslipidemia, hypertension and male sex. Current diabetic treatment includes insulin injections. He is compliant with treatment none of the time. His breakfast blood glucose range is generally 110-130 mg/dl. ( ) An ACE inhibitor/angiotensin II receptor blocker is not being taken. Eye exam is not current (Pt states he has an appt in two weeks).  Hypertension This is a chronic problem. The current episode started more than 1 year ago. The problem has been waxing and waning since onset. The problem is uncontrolled. Pertinent negatives include no anxiety, chest pain, headaches, palpitations, peripheral edema or shortness of breath. Risk factors for coronary artery disease include dyslipidemia and male gender. Past treatments include diuretics and calcium channel blockers. The current treatment provides mild improvement. Hypertensive end-organ damage includes kidney disease. There is no history of CAD/MI, CVA, heart failure or a thyroid problem. There is no history of sleep apnea.  Hyperlipidemia This is a chronic problem. The current episode started more than 1 year ago. The problem is uncontrolled. Recent lipid tests were reviewed and are  high. Exacerbating diseases include diabetes. Pertinent negatives include no chest pain, leg pain, myalgias or shortness of breath. Current antihyperlipidemic treatment includes diet change and statins. The current treatment provides moderate improvement of lipids. Risk factors for coronary artery disease include diabetes mellitus, dyslipidemia, male sex and hypertension.      Review of Systems  Constitutional: Negative.   HENT: Negative.   Respiratory: Negative.  Negative for shortness of breath.   Cardiovascular: Negative.  Negative for chest pain and palpitations.  Gastrointestinal: Negative.   Endocrine: Negative.   Genitourinary: Negative.   Musculoskeletal: Negative.  Negative for myalgias.  Neurological: Negative.  Negative for dizziness and headaches.  Hematological: Negative.   Psychiatric/Behavioral: Negative.  Negative for confusion.  All other systems reviewed and are negative.      Objective:   Physical Exam  Constitutional: He is oriented to person, place, and time. He appears well-developed and well-nourished. No distress.  HENT:  Head: Normocephalic.  Right Ear: External ear normal.  Left Ear: External ear normal.  Mouth/Throat: Oropharynx is clear and moist.  Eyes: Pupils are equal, round, and reactive to light. Right eye exhibits no discharge. Left eye exhibits no discharge.  Neck: Normal range of motion. Neck supple. No thyromegaly present.  Cardiovascular: Normal rate, regular rhythm, normal heart sounds and intact distal pulses.   No murmur heard. Pulmonary/Chest: Effort normal and breath sounds normal. No respiratory distress. He has no wheezes.  Abdominal: Soft. Bowel sounds are normal. He exhibits no distension. There is no tenderness.  Musculoskeletal: Normal range of motion. He exhibits no edema or tenderness.  Neurological: He is alert and oriented to person, place, and time. He has normal reflexes. No cranial nerve deficit.  Skin: Skin is warm and dry.    No rash noted. No erythema.  Psychiatric: He has a normal mood and affect. His behavior is normal. Judgment and thought content normal.  Vitals reviewed.   BP 154/73 mmHg  Pulse 64  Temp(Src) 97.5 F (36.4 C) (Oral)  Ht 5' 3" (1.6 m)  Wt 133 lb 6.4 oz (60.51 kg)  BMI 23.64 kg/m2       Assessment & Plan:  1. Essential hypertension -Pt's Metoprolol increased to 50 mg in AM and 75 mg at bedtime -Dash diet information given -Exercise encouraged - Stress Management  -Continue current meds -RTO in 2 weeks  - CMP14+EGFR - metoprolol (LOPRESSOR) 50 MG tablet; Take 50 in AM and 75 mg at bedtime  Dispense: 75 tablet; Refill: 6  2. Chronic kidney disease (CKD) stage G4/A1, severely decreased glomerular filtration rate (GFR) between 15-29 mL/min/1.73 square meter and albuminuria creatinine ratio less than 30 mg/g (HCC) - CMP14+EGFR  3. Uncontrolled type 2 diabetes mellitus with complication, with long-term current use of insulin (HCC) -Low carb diet - CMP14+EGFR - Insulin Pen Needle 31G X 4 MM MISC; 1 application by Does not apply route 4 (four) times daily -  before meals and at bedtime.  Dispense: 100 each; Refill: 11  4. Diabetic retinopathy associated with type 2 diabetes mellitus, macular edema presence unspecified, unspecified retinopathy severity ([place]) - CMP14+EGFR -Pt has ophthalmologist appt in two weeks!1  Continue all meds Labs pending Health Maintenance reviewed Diet and exercise encouraged RTO 2 weeks   Evelina Dun, FNP

## 2015-05-21 LAB — CMP14+EGFR
A/G RATIO: 1.8 (ref 1.1–2.5)
ALT: 21 IU/L (ref 0–44)
AST: 20 IU/L (ref 0–40)
Albumin: 4 g/dL (ref 3.5–5.5)
Alkaline Phosphatase: 65 IU/L (ref 39–117)
BILIRUBIN TOTAL: 0.2 mg/dL (ref 0.0–1.2)
BUN/Creatinine Ratio: 13 (ref 9–20)
BUN: 46 mg/dL — AB (ref 6–24)
CHLORIDE: 110 mmol/L — AB (ref 97–106)
CO2: 16 mmol/L — ABNORMAL LOW (ref 18–29)
Calcium: 7.9 mg/dL — ABNORMAL LOW (ref 8.7–10.2)
Creatinine, Ser: 3.46 mg/dL (ref 0.76–1.27)
GFR calc Af Amer: 22 mL/min/{1.73_m2} — ABNORMAL LOW (ref >59)
GFR calc non Af Amer: 19 mL/min/{1.73_m2} — ABNORMAL LOW (ref >59)
GLUCOSE: 56 mg/dL — AB (ref 65–99)
Globulin, Total: 2.2 g/dL (ref 1.5–4.5)
POTASSIUM: 4.1 mmol/L (ref 3.5–5.2)
Sodium: 142 mmol/L (ref 136–144)
Total Protein: 6.2 g/dL (ref 6.0–8.5)

## 2015-05-22 ENCOUNTER — Other Ambulatory Visit: Payer: Self-pay | Admitting: Family

## 2015-05-22 ENCOUNTER — Encounter: Payer: Self-pay | Admitting: Family

## 2015-06-03 ENCOUNTER — Telehealth: Payer: Self-pay | Admitting: Family

## 2015-06-03 ENCOUNTER — Encounter: Payer: Self-pay | Admitting: Family

## 2015-06-03 ENCOUNTER — Ambulatory Visit (INDEPENDENT_AMBULATORY_CARE_PROVIDER_SITE_OTHER): Payer: Commercial Indemnity | Admitting: Family

## 2015-06-03 VITALS — BP 139/75 | HR 66 | Temp 98.4°F | Ht 63.0 in | Wt 137.0 lb

## 2015-06-03 DIAGNOSIS — N184 Chronic kidney disease, stage 4 (severe): Secondary | ICD-10-CM

## 2015-06-03 DIAGNOSIS — I10 Essential (primary) hypertension: Secondary | ICD-10-CM

## 2015-06-03 NOTE — Patient Instructions (Signed)
Mantenimiento de Technical sales engineer - Hombres (Health Maintenance, Male) Un estilo de vida saludable y los cuidados preventivos pueden favorecer la salud y [place].  No deje de [employer] de rutina de la salud, dentales y de Public librarian.  Consuma una dieta saludable. Los [employer], frutas, cereales integrales, productos lcteos descremados y protenas magras contienen los nutrientes que usted necesita y no tienen muchas caloras. Disminuya la ingesta de alimentos ricos en grasas slidas, azcares y sal agregadas. Si es necesario, pdale informacin acerca de una dieta [place] a su mdico.  Realizar actividad fsica de forma regular es una de las prcticas ms importantes que puede hacer por su salud. Los adultos deben hacer al menos 150 minutos de ejercicios de intensidad moderada (cualquier actividad que aumente la frecuencia cardaca y lo haga transpirar) cada semana. Adems, la [employer] de los adultos necesita practicar ejercicios de fortalecimiento muscular dos o ms veces por semana.  Mantenga un peso saludable. El ndice de masa corporal [facility]) es una herramienta que identifica posibles problemas con [place]. Proporciona una estimacin de la grasa corporal basndose en el peso y la altura. El mdico podr determinar su [facility] y ayudarlo a Scientist, forensic o Theatre manager un peso saludable. Para los adultos mayores de 20aos:  Un [facility] menor de 18,5 se considera bajo peso.  Un [facility] entre 18,5 y 24,9 es normal.  Un [facility] entre 25 y 29,9 se considera sobrepeso.  Un IMC de 30 o ms se considera obesidad.  Mantenga un nivel normal de lpidos y colesterol en la sangre practicando actividad fsica y minimizando la ingesta de grasas saturadas. Consuma una dieta balanceada e incluya variedad de frutas y vegetales. A partir de los 20 aos se deben realizar anlisis de sangre a fin de Freight forwarder nivel de lpidos y colesterol en la sangre y [place] cada 5 aos. Si los niveles de colesterol son altos,  tiene ms de 50 aos o tiene riesgo elevado de sufrir enfermedades cardacas, Designer, industrial/product controlarse con ms frecuencia. Si tiene [employer] de lpidos y colesterol, debe recibir tratamiento con medicamentos, si la dieta y el ejercicio no estn funcionando.  Si fuma, consulte con el mdico acerca de las opciones para dejar de [place]. Si no fuma, no comience.  Se recomienda realizar exmenes de deteccin de cncer de pulmn a personas adultas entre 74 y 90 aos que estn en riesgo de Horticulturist, commercial de pulmn por sus antecedentes de consumo de tabaco. Para quienes hayan fumado durante 30 aos un paquete diario, y sigan fumando o hayan dejado el hbito en algn momento en los ltimos 15 aos, se recomienda realizarse una tomografa computada de baja dosis de los pulmones todos los [place]. Fumar un paquete por ao equivale a fumar un promedio de un paquete de cigarrillos diario durante un ao (por ejemplo, fumar 30paquetes por ao podra significar fumar un paquete de cigarrillos diario durante 30aos o 2paquetes diarios durante 15aos). Los exmenes anuales deben continuar hasta que el fumador haya dejado de fumar durante un mnimo de 15 aos. Ya no deben Emergency planning/management officer que tengan un problema de salud que les impida recibir tratamiento para el cncer de pulmn.  Si decide tomar alcohol, no beba ms de [employer]. Se considera una medida 12onzas (372m) de cerveza, 5onzas (1551m de vino o 1,6,[id number] licor.  Evite el consumo de drogas. No comparta agujas. Pida ayuda si necesita asistencia o instrucciones con respecto a abandonar el consumo de drogas.  La  hipertensin arterial causa enfermedades cardacas y [place] el riesgo de ictus. La hipertensin arterial es ms probable en los siguientes casos:  Las personas que tienen la presin arterial en el extremo del rango normal (100-139/85-89 mm Hg).  Las personas con sobrepeso u obesidad.  Las Administrator, arts.  Si usted tiene entre 18 y 39 aos, debe medirse la presin arterial cada 3 a 5 aos. Si usted tiene 40 aos o ms, debe medirse la presin arterial [employer]. Debe medirse la presin arterial dos veces: una vez cuando est en un hospital o una clnica y la otra vez cuando est en otro sitio. Registre el promedio de [employer]. Para controlar su presin arterial cuando no est en un hospital o [place], puede usar lo siguiente:  Ardelia Mems mquina automtica para medir la presin arterial en una farmacia.  Un monitor para medir la presin arterial en el hogar.  Si tiene entre 21 y 47 aos, consulte a su mdico si debe tomar aspirina para prevenir enfermedades cardacas.  Los anlisis para la diabetes incluyen la toma de [place] de sangre para controlar el nivel de azcar en la sangre durante el [place]. Debe hacerlos cada 3aos despus de los 69aos si su peso es normal y no tiene factores de riesgo de diabetes. Las pruebas deben comenzar a edades tempranas o llevarse a cabo con ms frecuencia si tiene sobrepeso y al menos un factor de riesgo para la diabetes.  El cncer colorrectal puede detectarse y con frecuencia puede prevenirse. La mayor parte de los estudios de rutina se deben Medical laboratory scientific officer a Field seismologist a Proofreader de los 69 aos y Roann 56 aos. Sin embargo, el mdico podr aconsejarle que lo haga antes, si tiene factores de riesgo para el cncer de colon. Una vez por ao, el mdico le dar un kit de prueba para Hydrologist en la materia fecal. Es posible que se use una pequea cmara en el extremo de un tubo para examinar directamente el colon (sigmoidoscopa o colonoscopa) para Hydrographic surveyor formas tempranas de cncer colorrectal. Hable sobre esto con su mdico si tiene 80 aos, edad a la que [employer] estudios de [place]. El examen directo del colon debe repetirse cada cinco a 10 aos, hasta los 75 aos, excepto que se encuentren formas tempranas de plipos  precancerosos o pequeos bultos.  Las personas con un riesgo mayor de Insurance risk surveyor hepatitis B deben realizarse anlisis para Futures trader virus. Se considera que tiene un alto riesgo de Museum/gallery curator hepatitis B si:  Naci en un pas donde la hepatitis B es frecuente. Pregntele a su mdico qu pases son considerados de Public affairs consultant.  Sus padres nacieron en un pas de alto riesgo y usted no recibi una vacuna que lo proteja contra la hepatitis B (vacuna contra la hepatitis B).  [place].  [place] agujas para inyectarse drogas.  Vive o tiene sexo con alguien que tiene hepatitis B.  Es un hombre que tiene sexo con otros hombres.  Recibe tratamiento de hemodilisis.  Toma ciertos medicamentos para Nurse, mental health, trasplante de rganos y afecciones autoinmunes.  Se recomienda realizar un anlisis de sangre para Hydrographic surveyor hepatitis C a todas las personas nacidas entre 1945 y 1965, y a toda persona que tenga un riesgo de haber contrado esta enfermedad.  Los hombres sanos no deben hacerse anlisis de sangre para Hydrographic surveyor antgenos especficos prostticos (PSA) como parte de los estudios de rutina para Science writer. Pregntele a su mdico sobre  las pruebas de Programme researcher, broadcasting/film/video de cncer de prstata.  La evaluacin del cncer de testculos no se recomienda en hombres adolescentes ni adultos que no tengan sntomas. La evaluacin incluye el autoexamen, el examen por parte del mdico y otras pruebas diagnsticas. Consulte con su mdico si tiene algn sntoma o preocupaciones acerca del cncer de testculos.  Practique el sexo seguro. Use condones y evite las prcticas sexuales riesgosas para disminuir el contagio de enfermedades de transmisin sexual (ETS).  Debe realizarse pruebas de deteccin de ETS, incluidas la gonorrea y la clamidia si:  Es sexualmente activa y es menor de [place].  Es mayor de 57aos y Investment banker, operational dice que est en riesgo de padecer esta infeccin.  La actividad sexual ha  cambiado desde que le hicieron la ltima prueba de deteccin y tiene un riesgo mayor de Best boy clamidia o Radio broadcast assistant. Pregntele al mdico si usted tiene riesgo.  Si tiene riesgo de infectarse por el VIH, se recomienda tomar diariamente un medicamento recetado para evitar la infeccin. Esto se conoce como profilaxis previa a la exposicin. Se considera que est en riesgo si:  Es un hombre que tiene sexo con otros hombres.  Es heterosexual y es activo sexualmente con mltiples parejas.  Se inyecta drogas.  Es sexualmente activo con una pareja que tiene VIH.  Consulte a su mdico para saber si tiene un alto riesgo de infectarse por el VIH. Si opta por comenzar la profilaxis previa a la exposicin, primero debe realizarse anlisis de deteccin del VIH. Luego, le harn anlisis cada 16mses mientras est tomando los medicamentos para la profilaxis previa a la exposicin.  Utilice pantalla solar. Aplique pantalla solar de mKerry Doryy repetida a lo largo del dTraining and development officer Resgurdese del sol cuando la sombra sea ms pequea que usted. Protjase usando mangas y pThe [employer] un sombrero de ala ancha y gafas para el sol todo el ao, siempre que se encuentre en el exterior.  Informe a su mdico si aparecen nuevos lunares o los que tiene se modifican, especialmente en forma y color. Tambin notifique al mdico si un lunar es ms grande que el tamao de una goma de lGames developer  Si tiene entre 660y 779aos y es o ha sido fumador, se recomienda un estudio con ecografa para dEnvironmental managerde aorta abdominal (AAA) y su eventual reparacin qUnited [place]  [place](inmunizaciones).   Esta informacin no tiene cMarine scientistel consejo del mdico. Asegrese de hacerle al mdico cualquier pregunta que tenga.   Document Released: 01/01/2008 Document Revised: 07/26/2014 Elsevier Interactive Patient Education 2[employer]

## 2015-06-03 NOTE — Addendum Note (Signed)
Addended by: Earlene Plater on: 06/03/2015 04:30 PM   Modules accepted: Miquel Dunn

## 2015-06-03 NOTE — Progress Notes (Signed)
   Subjective:    Patient ID: Craig Fisher, male    DOB: 11-01-1960, 54 y.o.   MRN: [id number]  Pt presents to the office today to recheck HTN. Pt's BP is  at goal!!! Hypertension This is a chronic problem. The current episode started more than 1 year ago. The problem has been resolved since onset. The problem is controlled. Pertinent negatives include no anxiety, headaches, palpitations, peripheral edema or shortness of breath. Risk factors for coronary artery disease include diabetes mellitus, dyslipidemia and male gender. Past treatments include beta blockers and calcium channel blockers. The current treatment provides moderate improvement. Hypertensive end-organ damage includes kidney disease. There is no history of CAD/MI, CVA, heart failure or a thyroid problem. There is no history of sleep apnea.      Review of Systems  Constitutional: Negative.   HENT: Negative.   Respiratory: Negative.  Negative for shortness of breath.   Cardiovascular: Negative.  Negative for palpitations.  Gastrointestinal: Negative.   Endocrine: Negative.   Genitourinary: Negative.   Musculoskeletal: Negative.   Neurological: Negative.  Negative for headaches.  Hematological: Negative.   Psychiatric/Behavioral: Negative.   All other systems reviewed and are negative.      Objective:   Physical Exam  Constitutional: He is oriented to person, place, and time. He appears well-developed and well-nourished. No distress.  HENT:  Head: Normocephalic.  Right Ear: External ear normal.  Left Ear: External ear normal.  Nose: Nose normal.  Mouth/Throat: Oropharynx is clear and moist.  Eyes: Pupils are equal, round, and reactive to light. Right eye exhibits no discharge. Left eye exhibits no discharge.  Neck: Normal range of motion. Neck supple. No thyromegaly present.  Cardiovascular: Normal rate, regular rhythm, normal heart sounds and intact distal pulses.   No murmur heard. Pulmonary/Chest: Effort normal and  breath sounds normal. No respiratory distress. He has no wheezes.  Abdominal: Soft. Bowel sounds are normal. He exhibits no distension. There is no tenderness.  Musculoskeletal: Normal range of motion. He exhibits no edema or tenderness.  Neurological: He is alert and oriented to person, place, and time. He has normal reflexes. No cranial nerve deficit.  Skin: Skin is warm and dry. No rash noted. No erythema.  Psychiatric: He has a normal mood and affect. His behavior is normal. Judgment and thought content normal.  Vitals reviewed.     BP 155/75 mmHg  Pulse 67  Temp(Src) 98.4 F (36.9 C) (Oral)  Ht $R'5\' 3"'Ii$  (1.6 m)  Wt 137 lb (62.143 kg)  BMI 24.27 kg/m2     Assessment & Plan:  1. Chronic kidney disease (CKD) stage G4/A1, severely decreased glomerular filtration rate (GFR) between 15-29 mL/min/1.73 square meter and albuminuria creatinine ratio less than 30 mg/g (HCC) - Ambulatory referral to Nephrology - BMP8+EGFR  2. Essential hypertension --Daily blood pressure log given with instructions on how to fill out and told to bring to next visit -Dash diet information given -Exercise encouraged - Stress Management  -Continue current meds -RTO in 2 months - BMP8+EGFR  Evelina Dun, FNP

## 2015-06-03 NOTE — Addendum Note (Signed)
Addended by: Earlene Plater on: 06/03/2015 04:29 PM   Modules accepted: Miquel Dunn

## 2015-06-04 LAB — BMP8+EGFR
BUN / CREAT RATIO: 14 (ref 9–20)
BUN: 51 mg/dL — ABNORMAL HIGH (ref 6–24)
CO2: 20 mmol/L (ref 18–29)
Calcium: 7.9 mg/dL — ABNORMAL LOW (ref 8.7–10.2)
Chloride: 106 mmol/L (ref 97–106)
Creatinine, Ser: 3.77 mg/dL (ref 0.76–1.27)
GFR calc non Af Amer: 17 mL/min/{1.73_m2} — ABNORMAL LOW (ref >59)
GFR, EST AFRICAN AMERICAN: 20 mL/min/{1.73_m2} — AB (ref >59)
Glucose: 83 mg/dL (ref 65–99)
POTASSIUM: 4.4 mmol/L (ref 3.5–5.2)
Sodium: 139 mmol/L (ref 136–144)

## 2015-06-05 LAB — HM DIABETES EYE EXAM

## 2015-06-06 ENCOUNTER — Ambulatory Visit: Payer: Self-pay | Admitting: Pharmacist

## 2015-06-11 ENCOUNTER — Encounter: Payer: Self-pay | Admitting: *Deleted

## 2015-07-07 ENCOUNTER — Other Ambulatory Visit (HOSPITAL_COMMUNITY): Payer: Self-pay | Admitting: Nephrology

## 2015-07-07 DIAGNOSIS — N183 Chronic kidney disease, stage 3 unspecified: Secondary | ICD-10-CM

## 2015-07-18 ENCOUNTER — Encounter: Payer: Self-pay | Admitting: *Deleted

## 2015-07-25 ENCOUNTER — Ambulatory Visit (HOSPITAL_COMMUNITY)
Admission: RE | Admit: 2015-07-25 | Discharge: 2015-07-25 | Disposition: A | Discharge status: Home / Self Care | Payer: Managed Care, Other (non HMO) | Source: Ambulatory Visit | Attending: Nephrology | Admitting: Nephrology

## 2015-07-25 DIAGNOSIS — N183 Chronic kidney disease, stage 3 unspecified: Secondary | ICD-10-CM

## 2015-08-04 ENCOUNTER — Ambulatory Visit: Payer: Commercial Indemnity | Admitting: Family

## 2015-08-11 ENCOUNTER — Encounter: Payer: Self-pay | Admitting: Family

## 2015-08-11 ENCOUNTER — Ambulatory Visit (INDEPENDENT_AMBULATORY_CARE_PROVIDER_SITE_OTHER): Payer: Managed Care, Other (non HMO) | Admitting: Family

## 2015-08-11 VITALS — BP 139/67 | HR 70 | Temp 97.3°F | Ht 63.0 in | Wt 137.2 lb

## 2015-08-11 DIAGNOSIS — Z125 Encounter for screening for malignant neoplasm of prostate: Secondary | ICD-10-CM | POA: Diagnosis not present

## 2015-08-11 DIAGNOSIS — N184 Chronic kidney disease, stage 4 (severe): Secondary | ICD-10-CM

## 2015-08-11 DIAGNOSIS — E1165 Type 2 diabetes mellitus with hyperglycemia: Secondary | ICD-10-CM | POA: Diagnosis not present

## 2015-08-11 DIAGNOSIS — E11319 Type 2 diabetes mellitus with unspecified diabetic retinopathy without macular edema: Secondary | ICD-10-CM | POA: Diagnosis not present

## 2015-08-11 DIAGNOSIS — I10 Essential (primary) hypertension: Secondary | ICD-10-CM

## 2015-08-11 DIAGNOSIS — Z794 Long term (current) use of insulin: Secondary | ICD-10-CM

## 2015-08-11 DIAGNOSIS — E785 Hyperlipidemia, unspecified: Secondary | ICD-10-CM

## 2015-08-11 DIAGNOSIS — E118 Type 2 diabetes mellitus with unspecified complications: Secondary | ICD-10-CM

## 2015-08-11 DIAGNOSIS — IMO0002 Reserved for concepts with insufficient information to code with codable children: Secondary | ICD-10-CM

## 2015-08-11 LAB — POCT GLYCOSYLATED HEMOGLOBIN (HGB A1C): HEMOGLOBIN A1C: 6.3

## 2015-08-11 MED ORDER — INSULIN GLARGINE 100 UNIT/ML SOLOSTAR PEN: 25.0000 [IU] | PEN_INJECTOR | Freq: Every day | SUBCUTANEOUS | Status: DC

## 2015-08-11 NOTE — Progress Notes (Signed)
Subjective:    Patient ID: Craig Fisher, male    DOB: [birth year], 55 y.o.   MRN: [id number]  Pt presents to the office today for chronic follow up. PT is followed by nephrologists every 2 months for chronic kidney disease. Diabetes He presents for his follow-up diabetic visit. He has type 2 diabetes mellitus. His disease course has been fluctuating. Pertinent negatives for hypoglycemia include no confusion, dizziness or headaches. Associated symptoms include visual change. Pertinent negatives for diabetes include no chest pain, no foot paresthesias and no foot ulcerations. Symptoms are worsening. Diabetic complications include nephropathy. Pertinent negatives for diabetic complications include no CVA, heart disease or peripheral neuropathy. Risk factors for coronary artery disease include diabetes mellitus, dyslipidemia, hypertension and male sex. Current diabetic treatment includes insulin injections. He is compliant with treatment none of the time. His breakfast blood glucose range is generally 110-130 mg/dl. ( ) An ACE inhibitor/angiotensin II receptor blocker is contraindicated. Eye exam is current (November 2016).  Hypertension This is a chronic problem. The current episode started more than 1 year ago. The problem has been waxing and waning since onset. The problem is controlled. Pertinent negatives include no anxiety, chest pain, headaches, palpitations, peripheral edema or shortness of breath. Risk factors for coronary artery disease include dyslipidemia and male gender. Past treatments include diuretics and calcium channel blockers. The current treatment provides mild improvement. Hypertensive end-organ damage includes kidney disease. There is no history of CAD/MI, CVA, heart failure or a thyroid problem. There is no history of sleep apnea.  Hyperlipidemia This is a chronic problem. The current episode started more than 1 year ago. The problem is uncontrolled. Recent lipid tests were reviewed and  are high. Exacerbating diseases include diabetes. Pertinent negatives include no chest pain, leg pain, myalgias or shortness of breath. Current antihyperlipidemic treatment includes diet change and statins. The current treatment provides moderate improvement of lipids. Risk factors for coronary artery disease include diabetes mellitus, dyslipidemia, male sex and hypertension.      Review of Systems  Constitutional: Negative.   HENT: Negative.   Respiratory: Negative.  Negative for shortness of breath.   Cardiovascular: Negative.  Negative for chest pain and palpitations.  Gastrointestinal: Negative.   Endocrine: Negative.   Genitourinary: Negative.   Musculoskeletal: Negative.  Negative for myalgias.  Neurological: Negative.  Negative for dizziness and headaches.  Hematological: Negative.   Psychiatric/Behavioral: Negative.  Negative for confusion.  All other systems reviewed and are negative.      Objective:   Physical Exam  Constitutional: He is oriented to person, place, and time. He appears well-developed and well-nourished. No distress.  HENT:  Head: Normocephalic.  Right Ear: External ear normal.  Left Ear: External ear normal.  Nose: Nose normal.  Mouth/Throat: Oropharynx is clear and moist.  Eyes: Pupils are equal, round, and reactive to light. Right eye exhibits no discharge. Left eye exhibits no discharge.  Neck: Normal range of motion. Neck supple. No thyromegaly present.  Cardiovascular: Normal rate, regular rhythm, normal heart sounds and intact distal pulses.   No murmur heard. Pulmonary/Chest: Effort normal and breath sounds normal. No respiratory distress. He has no wheezes.  Abdominal: Soft. Bowel sounds are normal. He exhibits no distension. There is no tenderness.  Musculoskeletal: Normal range of motion. He exhibits no edema or tenderness.  Neurological: He is alert and oriented to person, place, and time. He has normal reflexes. No cranial nerve deficit.    Skin: Skin is warm and dry. No rash noted. No  erythema.  Psychiatric: He has a normal mood and affect. His behavior is normal. Judgment and thought content normal.  Vitals reviewed.     BP 139/67 mmHg  Pulse 70  Temp(Src) 97.3 F (36.3 C) (Oral)  Ht 5' 3"  (1.6 m)  Wt 137 lb 3.2 oz (62.234 kg)  BMI 24.31 kg/m2     Assessment & Plan:  1. Essential hypertension - CMP14+EGFR  2. Uncontrolled type 2 diabetes mellitus with complication, with long-term current use of insulin (HCC) - POCT glycosylated hemoglobin (Hb A1C) - CMP14+EGFR - Insulin Glargine (LANTUS) 100 UNIT/ML Solostar Pen; Inject 25 Units into the skin daily at 10 pm.  Dispense: 15 mL; Refill: 11  3. Chronic kidney disease (CKD) stage G4/A1, severely decreased glomerular filtration rate (GFR) between 15-29 mL/min/1.73 square meter and albuminuria creatinine ratio less than 30 mg/g (HCC) - CMP14+EGFR  4. HLD (hyperlipidemia) - CMP14+EGFR - Lipid panel  5. Diabetic retinopathy associated with type 2 diabetes mellitus, macular edema presence unspecified, unspecified retinopathy severity (HCC) - CMP14+EGFR  6. Prostate cancer screening - CMP14+EGFR - PSA, total and free   Continue all meds, keep appt with nephrologists  Labs pending Health Maintenance reviewed Diet and exercise encouraged RTO 3 months  Evelina Dun, FNP

## 2015-08-11 NOTE — Patient Instructions (Signed)
La diabetes mellitus y los alimentos (Diabetes Mellitus and Food) Es importante que controle su nivel de azcar en la sangre (glucosa). El nivel de glucosa en sangre depende en gran medida de lo que usted come. Comer alimentos saludables en las cantidades [place] a lo largo del Training and development officer, aproximadamente a la misma hora [employer], lo ayudar a Chief Technology Officer su nivel de Multimedia programmer. Tambin puede ayudarlo a retrasar o Patent attorney de la diabetes mellitus. Comer de [employer] saludable incluso puede ayudarlo a Chartered loss adjuster de presin arterial y a Science writer o Theatre manager un peso saludable.  Entre las recomendaciones generales para alimentarse y Audiological scientist los alimentos de forma saludable, se incluyen las siguientes:  Respetar las comidas principales y comer colaciones con regularidad. Evitar pasar largos perodos sin comer con el fin de perder peso.  Seguir una dieta que consista principalmente en alimentos de origen vegetal, como frutas, vegetales, frutos secos, legumbres y cereales integrales.  Utilizar mtodos de coccin a baja temperatura, como hornear, en lugar de mtodos de coccin a alta temperatura, como frer en abundante aceite. Trabaje con el nutricionista para aprender a Financial planner nutricional de las etiquetas de los alimentos. CMO PUEDEN AFECTARME LOS ALIMENTOS? Carbohidratos Los carbohidratos afectan el nivel de glucosa en sangre ms que cualquier otro tipo de alimento. El nutricionista lo ayudar a Teacher, adult education cuntos carbohidratos puede consumir en cada comida y ensearle a contarlos. El recuento de carbohidratos es importante para mantener la glucosa en sangre en un nivel saludable, en especial si utiliza insulina o toma determinados medicamentos para la diabetes mellitus. Alcohol El alcohol puede provocar disminuciones sbitas de la glucosa en sangre (hipoglucemia), en especial si utiliza insulina o toma determinados medicamentos para la diabetes mellitus. La  hipoglucemia es una afeccin que puede poner en peligro la vida. Los sntomas de la hipoglucemia (somnolencia, mareos y Data processing manager) son similares a los sntomas de haber consumido mucho alcohol.  Si el mdico lo autoriza a beber alcohol, hgalo con moderacin y siga estas pautas:  Las mujeres no deben beber ms de un trago por da, y los hombres no deben beber ms de dos tragos por Training and development officer. Un trago es igual a:  12 onzas (355 ml) de cerveza  5 onzas de vino (150 ml) de vino  1,5onzas (23m) de bebidas espirituosas  No beba con el estmago vaco.  Mantngase hidratado. Beba agua, gaseosas dietticas o t helado sin azcar.  Las gaseosas comunes, los jugos y otros refrescos podran contener muchos carbohidratos y se dCivil Service fast streamer QU ALIMENTOS NO SE RECOMIENDAN? Cuando haga las elecciones de alimentos, es importante que recuerde que todos los alimentos son distintos. Algunos tienen menos nutrientes que otros por porcin, aunque podran tener la misma cantidad de caloras o carbohidratos. Es difcil darle al cuerpo lo que necesita cuando consume alimentos con menos nutrientes. Estos son algunos ejemplos de alimentos que debera evitar ya que contienen muchas caloras y carbohidratos, pero pocos nutrientes:  GPhysicist, medicaltrans (la mayora de los alimentos procesados incluyen grasas trans en la etiqueta de Informacin nutricional).  Gaseosas comunes.  Jugos.  Caramelos.  Dulces, como tortas, pasteles, rosquillas y g[place]  Comidas fritas. QU ALIMENTOS PUEDO COMER? Consuma alimentos ricos en nutrientes, que nutrirn el cuerpo y lo mantendrn saludable. Los alimentos que debe comer tambin dependern de varios factores, como:  Las caloras que necesita.  Los medicamentos que toma.  Su peso.  El nivel de glucosa en sMarist College  El nArrow Rockde presin arterial.  El nivel de colesterol.  Debe consumir una amplia variedad de alimentos, por ejemplo:  Protenas.  Cortes de [employer].  Protenas con bajo contenido de grasas saturadas, como pescado, clara de huevo y frijoles. Evite las carnes procesadas.  Frutas y vegetales.  Frutas y Sports administrator que pueden ayudar a Chief Operating Officer los niveles sanguneos de [place], como [place], mangos y batatas.  Productos lcteos.  Elija productos lcteos sin grasa o con bajo contenido de [place], como [place], yogur y [place].  Cereales, panes, pastas y arroz.  Elija cereales integrales, como panes multicereales, avena en grano y arroz integral. Estos alimentos pueden ayudar a controlar la presin arterial.  Rosalin Hawking.  Alimentos que contengan grasas saludables, como frutos secos, Chartered certified accountant, aceite de [place], aceite de canola y pescado. TODOS LOS QUE PADECEN DIABETES MELLITUS TIENEN EL MISMO PLAN DE COMIDAS? Dado que todas las personas que padecen diabetes mellitus son distintas, no hay un solo plan de comidas que funcione para todos. Es muy importante que se rena con un nutricionista que lo ayudar a crear un plan de comidas adecuado para usted.   Esta informacin no tiene Theme park manager el consejo del mdico. Asegrese de hacerle al mdico cualquier pregunta que tenga.   Document Released: 10/12/2007 Document Revised: 07/26/2014 Elsevier Interactive Patient Education 2016 [employer]. Mantenimiento de la salud - Hombres (Health Maintenance, Male) Un estilo de vida saludable y los cuidados preventivos pueden favorecer la salud y [place].  No deje de [employer] de rutina de la salud, dentales y de Wellsite geologist.  Consuma una dieta saludable. Los [employer], frutas, cereales integrales, productos lcteos descremados y protenas magras contienen los nutrientes que usted necesita y no tienen muchas caloras. Disminuya la ingesta de alimentos ricos en grasas slidas, azcares y sal agregadas. Si es necesario, pdale informacin acerca de una dieta [place] a su mdico.  Realizar actividad fsica de forma  regular es una de las prcticas ms importantes que puede hacer por su salud. Los adultos deben hacer al menos 150 minutos de ejercicios de intensidad moderada (cualquier actividad que aumente la frecuencia cardaca y lo haga transpirar) cada semana. Adems, la [employer] de los adultos necesita practicar ejercicios de fortalecimiento muscular dos o ms veces por semana.  Mantenga un peso saludable. El ndice de masa corporal [facility]) es una herramienta que identifica posibles problemas con [place]. Proporciona una estimacin de la grasa corporal basndose en el peso y la altura. El mdico podr determinar su [facility] y ayudarlo a Personnel officer o Pharmacologist un peso saludable. Para los adultos mayores de 20aos:  Un [facility] menor de 18,5 se considera bajo peso.  Un [facility] entre 18,5 y 24,9 es normal.  Un [facility] entre 25 y 29,9 se considera sobrepeso.  Un IMC de 30 o ms se considera obesidad.  Mantenga un nivel normal de lpidos y colesterol en la sangre practicando actividad fsica y minimizando la ingesta de grasas saturadas. Consuma una dieta balanceada e incluya variedad de frutas y vegetales. A partir de los 20 aos se deben realizar anlisis de sangre a fin de Sales executive nivel de lpidos y colesterol en la sangre y repetirlos cada 5 aos. Si los niveles de colesterol son altos, tiene ms de 50 aos o tiene riesgo elevado de sufrir enfermedades cardacas, Pension scheme manager controlarse con ms frecuencia. Si tiene [employer] de lpidos y colesterol, debe recibir tratamiento con medicamentos, si la dieta y el ejercicio no estn funcionando.  Si fuma, consulte con el mdico acerca de las opciones para dejar de  hacerlo. Si no fuma, no comience.  Se recomienda realizar exmenes de deteccin de cncer de pulmn a personas adultas entre 53 y 71 aos que estn en riesgo de Horticulturist, commercial de pulmn por sus antecedentes de consumo de tabaco. Para quienes hayan fumado durante 30 aos un paquete diario, y sigan fumando o hayan  dejado el hbito en algn momento en los ltimos 15 aos, se recomienda realizarse una tomografa computada de baja dosis de los pulmones todos los [place]. Fumar un paquete por ao equivale a fumar un promedio de un paquete de cigarrillos diario durante un ao (por ejemplo, fumar 30paquetes por ao podra significar fumar un paquete de cigarrillos diario durante 30aos o 2paquetes diarios durante 15aos). Los exmenes anuales deben continuar hasta que el fumador haya dejado de fumar durante un mnimo de 15 aos. Ya no deben Emergency planning/management officer que tengan un problema de salud que les impida recibir tratamiento para el cncer de pulmn.  Si decide tomar alcohol, no beba ms de [employer]. Se considera una medida 12onzas (344m) de cerveza, 5onzas (1583m de vino o 1,0,[id number] licor.  Evite el consumo de drogas. No comparta agujas. Pida ayuda si necesita asistencia o instrucciones con respecto a abandonar el consumo de drogas.  La hipertensin arterial causa enfermedades cardacas y auSerbial riesgo de ictus. La hipertensin arterial es ms probable en los siguientes casos:  Las personas que tienen la presin arterial en el extremo del rango normal (100-139/85-89 mm Hg).  Las personas con sobrepeso u obesidad.  Las peRetail banker Si usted tiene entre 18 y 39 aos, debe medirse la presin arterial cada 3 a 5 aos. Si usted tiene 40 aos o ms, debe medirse la presin arterial toHewlett-PackardDebe medirse la presin arterial dos veces: una vez cuando est en un hospital o una clnica y la otra vez cuando est en otro sitio. Registre el promedio de laFederated Department StoresPara controlar su presin arterial cuando no est en un hospital o unGrace Isaacpuede usar lo siguiente:  UnArdelia Memsquina automtica para medir la presin arterial en una farmacia.  Un monitor para medir la presin arterial en el hogar.  Si tiene entre [id number], consulte a su mdico si debe tomar  aspirina para prevenir enfermedades cardacas.  Los anlisis para la diabetes incluyen la toma de unTanzaniae sangre para controlar el nivel de azcar en la sangre durante el ayRivertonDebe hacerlos cada 3aos despus de los 4580aosi su peso es normal y no tiene factores de riesgo de diabetes. Las pruebas deben comenzar a edades tempranas o llevarse a cabo con ms frecuencia si tiene sobrepeso y al menos un factor de riesgo para la diabetes.  El cncer colorrectal puede detectarse y con frecuencia puede prevenirse. La mayor parte de los estudios de rutina se deben coMedical laboratory scientific officer haField seismologist paProofreadere los 5029os y haWallenpaupack Lake Estates552os. Sin embargo, el mdico podr aconsejarle que lo haga antes, si tiene factores de riesgo para el cncer de colon. Una vez por ao, el mdico le dar un kit de prueba para haHydrologistn la materia fecal. Es posible que se use una pequea cmara en el extremo de un tubo para examinar directamente el colon (sigmoidoscopa o colonoscopa) para deHydrographic surveyorormas tempranas de cncer colorrectal. Hable sobre esto con su mdico si tiene 5028os, edad a la que coWhole Foodsstudios de ruNepalEl examen directo del colon  debe repetirse cada cinco a 10 aos, hasta los 75 aos, excepto que se encuentren formas tempranas de plipos precancerosos o pequeos bultos.  Las personas con un riesgo mayor de Insurance risk surveyor hepatitis B deben realizarse anlisis para Futures trader virus. Se considera que tiene un alto riesgo de Museum/gallery curator hepatitis B si:  Naci en un pas donde la hepatitis B es frecuente. Pregntele a su mdico qu pases son considerados de Public affairs consultant.  Sus padres nacieron en un pas de alto riesgo y usted no recibi una vacuna que lo proteja contra la hepatitis B (vacuna contra la hepatitis B).  [place].  [place] agujas para inyectarse drogas.  Vive o tiene sexo con alguien que tiene hepatitis B.  Es un hombre que tiene sexo con otros hombres.  Recibe tratamiento de  hemodilisis.  Toma ciertos medicamentos para Nurse, mental health, trasplante de rganos y afecciones autoinmunes.  Se recomienda realizar un anlisis de sangre para Hydrographic surveyor hepatitis C a todas las personas nacidas entre 1945 y 1965, y a toda persona que tenga un riesgo de haber contrado esta enfermedad.  Los hombres sanos no deben hacerse anlisis de sangre para Hydrographic surveyor antgenos especficos prostticos (PSA) como parte de los estudios de rutina para Science writer. Pregntele a su mdico sobre las pruebas de deteccin de cncer de prstata.  La evaluacin del cncer de testculos no se recomienda en hombres adolescentes ni adultos que no tengan sntomas. La evaluacin incluye el autoexamen, el examen por parte del mdico y otras pruebas diagnsticas. Consulte con su mdico si tiene algn sntoma o preocupaciones acerca del cncer de testculos.  Practique el sexo seguro. Use condones y evite las prcticas sexuales riesgosas para disminuir el contagio de enfermedades de transmisin sexual (ETS).  Debe realizarse pruebas de deteccin de ETS, incluidas la gonorrea y la clamidia si:  Es sexualmente activa y es menor de [place].  Es mayor de 71aos y Investment banker, operational dice que est en riesgo de padecer esta infeccin.  La actividad sexual ha cambiado desde que le hicieron la ltima prueba de deteccin y tiene un riesgo mayor de Best boy clamidia o Radio broadcast assistant. Pregntele al mdico si usted tiene riesgo.  Si tiene riesgo de infectarse por el VIH, se recomienda tomar diariamente un medicamento recetado para evitar la infeccin. Esto se conoce como profilaxis previa a la exposicin. Se considera que est en riesgo si:  Es un hombre que tiene sexo con otros hombres.  Es heterosexual y es activo sexualmente con mltiples parejas.  Se inyecta drogas.  Es sexualmente activo con una pareja que tiene VIH.  Consulte a su mdico para saber si tiene un alto riesgo de infectarse por el VIH. Si opta por  comenzar la profilaxis previa a la exposicin, primero debe realizarse anlisis de deteccin del VIH. Luego, le harn anlisis cada 7mses mientras est tomando los medicamentos para la profilaxis previa a la exposicin.  Utilice pantalla solar. Aplique pantalla solar de mKerry Doryy repetida a lo largo del dTraining and development officer Resgurdese del sol cuando la sombra sea ms pequea que usted. Protjase usando mangas y pThe ServiceMaster Company un sombrero de ala ancha y gafas para el sol todo el ao, siempre que se encuentre en el exterior.  Informe a su mdico si aparecen nuevos lunares o los que tiene se modifican, especialmente en forma y color. Tambin notifique al mdico si un lunar es ms grande que el tamao de una goma de lGames developer  Si tiene entre [phone] [id number] y es o  ha sido fumador, se recomienda un estudio con ecografa para detectar aneurisma de aorta abdominal (AAA) y su eventual reparacin [place].  [place] (inmunizaciones).   Esta informacin no tiene Marine scientist el consejo del mdico. Asegrese de hacerle al mdico cualquier pregunta que tenga.   Document Released: 01/01/2008 Document Revised: 07/26/2014 Elsevier Interactive Patient Education [employer].

## 2015-08-12 ENCOUNTER — Other Ambulatory Visit: Payer: Self-pay | Admitting: Family

## 2015-08-12 LAB — LIPID PANEL
CHOL/HDL RATIO: 4.8 ratio (ref 0.0–5.0)
CHOLESTEROL TOTAL: 224 mg/dL — AB (ref 100–199)
HDL: 47 mg/dL (ref >39)
LDL CALC: 134 mg/dL — AB (ref 0–99)
TRIGLYCERIDES: 214 mg/dL — AB (ref 0–149)
VLDL Cholesterol Cal: 43 mg/dL — ABNORMAL HIGH (ref 5–40)

## 2015-08-12 LAB — PSA, TOTAL AND FREE
PROSTATE SPECIFIC AG, SERUM: 0.7 ng/mL (ref 0.0–4.0)
PSA FREE PCT: 51.4 %
PSA FREE: 0.36 ng/mL

## 2015-08-12 LAB — CMP14+EGFR
ALK PHOS: 67 IU/L (ref 39–117)
ALT: 14 IU/L (ref 0–44)
AST: 18 IU/L (ref 0–40)
Albumin/Globulin Ratio: 1.5 (ref 1.1–2.5)
Albumin: 3.6 g/dL (ref 3.5–5.5)
BUN/Creatinine Ratio: 11 (ref 9–20)
BUN: 53 mg/dL — AB (ref 6–24)
CHLORIDE: 104 mmol/L (ref 96–106)
CO2: 18 mmol/L (ref 18–29)
CREATININE: 4.78 mg/dL — AB (ref 0.76–1.27)
Calcium: 7.4 mg/dL — ABNORMAL LOW (ref 8.7–10.2)
GFR calc Af Amer: 15 mL/min/{1.73_m2} — ABNORMAL LOW (ref >59)
GFR calc non Af Amer: 13 mL/min/{1.73_m2} — ABNORMAL LOW (ref >59)
GLUCOSE: 94 mg/dL (ref 65–99)
Globulin, Total: 2.4 g/dL (ref 1.5–4.5)
Potassium: 4.5 mmol/L (ref 3.5–5.2)
Sodium: 138 mmol/L (ref 134–144)
TOTAL PROTEIN: 6 g/dL (ref 6.0–8.5)

## 2015-08-15 ENCOUNTER — Other Ambulatory Visit: Payer: Self-pay | Admitting: *Deleted

## 2015-08-15 DIAGNOSIS — N184 Chronic kidney disease, stage 4 (severe): Secondary | ICD-10-CM

## 2015-08-15 DIAGNOSIS — Z01810 Encounter for preprocedural cardiovascular examination: Secondary | ICD-10-CM

## 2015-09-05 ENCOUNTER — Encounter: Payer: Self-pay | Admitting: Vascular Surgery

## 2015-09-12 ENCOUNTER — Other Ambulatory Visit (HOSPITAL_COMMUNITY): Payer: Self-pay

## 2015-09-12 ENCOUNTER — Ambulatory Visit: Payer: Self-pay | Admitting: Vascular Surgery

## 2015-09-12 ENCOUNTER — Encounter (HOSPITAL_COMMUNITY): Payer: Self-pay

## 2015-10-16 ENCOUNTER — Other Ambulatory Visit: Payer: Self-pay | Admitting: *Deleted

## 2015-10-17 ENCOUNTER — Ambulatory Visit (HOSPITAL_COMMUNITY)
Admission: RE | Admit: 2015-10-17 | Discharge: 2015-10-17 | Disposition: A | Discharge status: Home / Self Care | Payer: Managed Care, Other (non HMO) | Source: Ambulatory Visit | Attending: Vascular Surgery | Admitting: Vascular Surgery

## 2015-10-17 ENCOUNTER — Encounter: Payer: Self-pay | Admitting: Vascular Surgery

## 2015-10-17 ENCOUNTER — Ambulatory Visit (INDEPENDENT_AMBULATORY_CARE_PROVIDER_SITE_OTHER)
Admission: RE | Admit: 2015-10-17 | Discharge: 2015-10-17 | Disposition: A | Discharge status: Home / Self Care | Payer: Managed Care, Other (non HMO) | Source: Ambulatory Visit | Attending: Vascular Surgery | Admitting: Vascular Surgery

## 2015-10-17 DIAGNOSIS — I129 Hypertensive chronic kidney disease with stage 1 through stage 4 chronic kidney disease, or unspecified chronic kidney disease: Secondary | ICD-10-CM | POA: Diagnosis not present

## 2015-10-17 DIAGNOSIS — E1122 Type 2 diabetes mellitus with diabetic chronic kidney disease: Secondary | ICD-10-CM | POA: Diagnosis not present

## 2015-10-17 DIAGNOSIS — N184 Chronic kidney disease, stage 4 (severe): Secondary | ICD-10-CM

## 2015-10-17 DIAGNOSIS — E785 Hyperlipidemia, unspecified: Secondary | ICD-10-CM | POA: Diagnosis not present

## 2015-10-17 DIAGNOSIS — Z01810 Encounter for preprocedural cardiovascular examination: Secondary | ICD-10-CM

## 2015-10-20 ENCOUNTER — Encounter: Payer: Self-pay | Admitting: Vascular Surgery

## 2015-10-21 ENCOUNTER — Ambulatory Visit (INDEPENDENT_AMBULATORY_CARE_PROVIDER_SITE_OTHER): Payer: Managed Care, Other (non HMO) | Admitting: Vascular Surgery

## 2015-10-21 ENCOUNTER — Encounter: Payer: Self-pay | Admitting: Vascular Surgery

## 2015-10-21 ENCOUNTER — Other Ambulatory Visit: Payer: Self-pay

## 2015-10-21 VITALS — BP 172/74 | HR 64 | Temp 98.0°F | Resp 14 | Ht 64.0 in | Wt 128.0 lb

## 2015-10-21 DIAGNOSIS — N186 End stage renal disease: Secondary | ICD-10-CM | POA: Insufficient documentation

## 2015-10-21 DIAGNOSIS — N185 Chronic kidney disease, stage 5: Secondary | ICD-10-CM

## 2015-10-21 DIAGNOSIS — Z992 Dependence on renal dialysis: Secondary | ICD-10-CM

## 2015-10-21 NOTE — Progress Notes (Signed)
Subjective:    Patient ID: Craig Fisher, male DOB: [birth year], 55 y.o.   MRN: [id number]   HPI: This 55 year old male was referred for vascular access by Dr.Befacadu. Patient does not speak English but is accompanied by a Optometrist. He is right-handed. He does have diabetes mellitus and takes insulin. He has had progressive renal insufficiency for the last several years. Denies any pain or numbness in his hands. He is not yet on hemodialysis.  Past Medical History  Diagnosis Date  . Hyperlipidemia   . Hypertension   . Full dentures   . Wears glasses   . Blind left eye   . Chronic kidney disease   . Anemia   . Diabetes mellitus without complication [facility])     Social History  Substance Use Topics  . Smoking status: Never Smoker   . Smokeless tobacco: Never Used  . Alcohol Use: No    Family History  Problem Relation Age of Onset  . Diabetes Mother   . Diabetes Sister   . Esophageal cancer Neg Hx   . Colon cancer Neg Hx   . Rectal cancer Neg Hx   . Stomach cancer Neg Hx     No Known Allergies   Current outpatient prescriptions:  .  amLODipine (NORVASC) 10 MG tablet, Take 1 tablet (10 mg total) by mouth daily., Disp: 90 tablet, Rfl: 3 .  aspirin EC 81 MG tablet, Take 1 tablet (81 mg total) by mouth daily., Disp: 90 tablet, Rfl: 1 .  atorvastatin (LIPITOR) 20 MG tablet, Take 1 tablet (20 mg total) by mouth daily., Disp: 90 tablet, Rfl: 2 .  Cholecalciferol (VITAMIN D3) 1000 units CAPS, Take 1,000 Units by mouth daily., Disp: , Rfl:  .  hydrALAZINE (APRESOLINE) 50 MG tablet, , Disp: , Rfl:  .  Insulin Glargine (LANTUS) 100 UNIT/ML Solostar Pen, Inject 25 Units into the skin daily at 10 pm. (Patient taking differently: Inject 20 Units into the skin daily at 10 pm. ), Disp: 15 mL, Rfl: 11 .  Insulin Pen Needle 31G X 4 MM MISC, 1 application by Does not apply route 4 (four) times daily -  before meals and at bedtime., Disp: 100 each, Rfl: 11 .  Insulin Syringe-Needle U-100 (INSULIN  SYRINGE .5CC/31GX5/16") 31G X 5/16" 0.5 ML MISC, Use to injection insulin once daily, Disp: 100 each, Rfl: 1 .  Insulin Syringes, Disposable, U-100 0.5 ML MISC, Use to inject insulin once daily., Disp: 100 each, Rfl: 2 .  metoprolol (LOPRESSOR) 50 MG tablet, Take 50 in AM and 75 mg at bedtime, Disp: 75 tablet, Rfl: 6 .  sodium bicarbonate 650 MG tablet, , Disp: , Rfl:  .  timolol (TIMOPTIC) 0.5 % ophthalmic solution, Per pt, Disp: , Rfl:    ROS:  Denies chest pain, dyspnea on exertion, PND, orthopnea, hemoptysis, claudication. Patient does have blindness in the left eye. Other systems negative and complete review of systems  Objective:  Physical Exam: BP 172/74 mmHg  Pulse 64  Temp(Src) 98 F (36.7 C) (Oral)  Resp 14  Ht 5\' 4"  (1.626 m)  Wt 128 lb (58.06 kg)  BMI 21.96 kg/m2  SpO2 99%  Gen.-alert and oriented x3 in no apparent distress HEENT normal for age Lungs no rhonchi or wheezing Cardiovascular regular rhythm no murmurs carotid pulses 3+ palpable no bruits audible Abdomen soft nontender no palpable masses Musculoskeletal free of  major deformities Skin clear -no rashes Neurologic normal Lower extremities 3+ femoral and dorsalis pedis pulses palpable bilaterally with  no edema Left upper extremity with 3+ brachial and 2+ radial pulse palpable. Cephalic vein appears satisfactory in the forearm and upper arm for fistula creation on  Today I ordered bilateral upper extremity vein mapping and arterial study which I reviewed. Cephalic veins appear to be satisfactory bilateral in the forearm and upper arm.    Assessment:     Chronic kidney disease stage V needs vascular access Diabetes mellitus type 1 Chronic blindness left eye    Plan:     Plan creation left radial-cephalic AV fistula on Wednesday, April 12. Discussed with patient via the interpreter and he would like to proceed on that date

## 2015-10-23 ENCOUNTER — Other Ambulatory Visit (HOSPITAL_COMMUNITY): Payer: Self-pay

## 2015-10-23 ENCOUNTER — Encounter (HOSPITAL_COMMUNITY): Payer: Self-pay

## 2015-10-27 ENCOUNTER — Encounter: Payer: Self-pay | Admitting: Nephrology

## 2015-10-28 MED ORDER — CEFUROXIME SODIUM 1.5 G IJ SOLR
1.5000 g | INTRAMUSCULAR | Status: AC
  Administered 2015-10-29: 1.5 g via INTRAVENOUS
  Filled 2015-10-28: qty 1.5

## 2015-10-28 MED ORDER — SODIUM CHLORIDE 0.9 % IV SOLN
INTRAVENOUS | Status: DC
  Administered 2015-10-29: 11:00:00 via INTRAVENOUS

## 2015-10-29 ENCOUNTER — Encounter: Payer: Self-pay | Admitting: *Deleted

## 2015-10-29 ENCOUNTER — Encounter (HOSPITAL_COMMUNITY): Payer: Self-pay | Admitting: *Deleted

## 2015-10-29 ENCOUNTER — Ambulatory Visit (HOSPITAL_COMMUNITY): Payer: Managed Care, Other (non HMO) | Admitting: Anesthesiology

## 2015-10-29 ENCOUNTER — Other Ambulatory Visit: Payer: Self-pay | Admitting: *Deleted

## 2015-10-29 ENCOUNTER — Telehealth: Payer: Self-pay | Admitting: Vascular Surgery

## 2015-10-29 ENCOUNTER — Encounter (HOSPITAL_COMMUNITY)
Admission: RE | Disposition: A | Discharge status: Home / Self Care | Payer: Self-pay | Source: Ambulatory Visit | Attending: Vascular Surgery

## 2015-10-29 ENCOUNTER — Ambulatory Visit (HOSPITAL_COMMUNITY)
Admission: RE | Admit: 2015-10-29 | Discharge: 2015-10-29 | Disposition: A | Discharge status: Home / Self Care | Payer: Managed Care, Other (non HMO) | Source: Ambulatory Visit | Attending: Vascular Surgery | Admitting: Vascular Surgery

## 2015-10-29 DIAGNOSIS — N185 Chronic kidney disease, stage 5: Secondary | ICD-10-CM | POA: Diagnosis not present

## 2015-10-29 DIAGNOSIS — H5442 Blindness, left eye, normal vision right eye: Secondary | ICD-10-CM | POA: Insufficient documentation

## 2015-10-29 DIAGNOSIS — Z794 Long term (current) use of insulin: Secondary | ICD-10-CM | POA: Insufficient documentation

## 2015-10-29 DIAGNOSIS — E1022 Type 1 diabetes mellitus with diabetic chronic kidney disease: Secondary | ICD-10-CM | POA: Diagnosis not present

## 2015-10-29 DIAGNOSIS — E118 Type 2 diabetes mellitus with unspecified complications: Secondary | ICD-10-CM

## 2015-10-29 DIAGNOSIS — I10 Essential (primary) hypertension: Secondary | ICD-10-CM

## 2015-10-29 DIAGNOSIS — N186 End stage renal disease: Secondary | ICD-10-CM

## 2015-10-29 DIAGNOSIS — E785 Hyperlipidemia, unspecified: Secondary | ICD-10-CM | POA: Insufficient documentation

## 2015-10-29 DIAGNOSIS — Z4931 Encounter for adequacy testing for hemodialysis: Secondary | ICD-10-CM

## 2015-10-29 DIAGNOSIS — E1165 Type 2 diabetes mellitus with hyperglycemia: Secondary | ICD-10-CM

## 2015-10-29 DIAGNOSIS — I120 Hypertensive chronic kidney disease with stage 5 chronic kidney disease or end stage renal disease: Secondary | ICD-10-CM | POA: Diagnosis present

## 2015-10-29 DIAGNOSIS — IMO0002 Reserved for concepts with insufficient information to code with codable children: Secondary | ICD-10-CM

## 2015-10-29 HISTORY — DX: Other complications of anesthesia, initial encounter: T88.59XA

## 2015-10-29 HISTORY — DX: Headache, unspecified: R51.9

## 2015-10-29 HISTORY — DX: Headache: R51

## 2015-10-29 HISTORY — PX: AV FISTULA PLACEMENT: SHX1204

## 2015-10-29 HISTORY — DX: Adverse effect of unspecified anesthetic, initial encounter: T41.45XA

## 2015-10-29 LAB — POCT I-STAT 4, (NA,K, GLUC, HGB,HCT)
GLUCOSE: 114 mg/dL — AB (ref 65–99)
HCT: 26 % — ABNORMAL LOW (ref 39.0–52.0)
HEMOGLOBIN: 8.8 g/dL — AB (ref 13.0–17.0)
POTASSIUM: 4.2 mmol/L (ref 3.5–5.1)
Sodium: 141 mmol/L (ref 135–145)

## 2015-10-29 LAB — GLUCOSE, CAPILLARY
Glucose-Capillary: 111 mg/dL — ABNORMAL HIGH (ref 65–99)
Glucose-Capillary: 100 mg/dL — ABNORMAL HIGH (ref 65–99)

## 2015-10-29 SURGERY — ARTERIOVENOUS (AV) FISTULA CREATION
Anesthesia: General | Site: Arm Upper | Laterality: Left

## 2015-10-29 MED ORDER — CHLORHEXIDINE GLUCONATE CLOTH 2 % EX PADS
6.0000 | MEDICATED_PAD | Freq: Once | CUTANEOUS | Status: DC
Start: 2015-10-29 — End: 2015-10-29

## 2015-10-29 MED ORDER — MIDAZOLAM HCL 5 MG/5ML IJ SOLN
INTRAMUSCULAR | Status: DC | PRN
  Administered 2015-10-29: 2 mg via INTRAVENOUS

## 2015-10-29 MED ORDER — 0.9 % SODIUM CHLORIDE (POUR BTL) OPTIME
TOPICAL | Status: DC | PRN
  Administered 2015-10-29: 1000 mL

## 2015-10-29 MED ORDER — DEXMEDETOMIDINE HCL IN NACL 200 MCG/50ML IV SOLN
INTRAVENOUS | Status: DC | PRN
  Administered 2015-10-29: .5 ug/kg/h via INTRAVENOUS

## 2015-10-29 MED ORDER — OXYCODONE HCL 5 MG PO TABS: 5.0000 mg | ORAL_TABLET | Freq: Once | ORAL | Status: DC | PRN

## 2015-10-29 MED ORDER — METOPROLOL TARTRATE 50 MG PO TABS
50.0000 mg | ORAL_TABLET | Freq: Once | ORAL | Status: AC
  Administered 2015-10-29: 50 mg via ORAL
  Filled 2015-10-29: qty 1

## 2015-10-29 MED ORDER — METOPROLOL TARTRATE 50 MG PO TABS
ORAL_TABLET | ORAL | Status: AC
  Filled 2015-10-29: qty 1

## 2015-10-29 MED ORDER — SODIUM CHLORIDE 0.9 % IV SOLN
INTRAVENOUS | Status: DC | PRN
  Administered 2015-10-29: 500 mL

## 2015-10-29 MED ORDER — INSULIN GLARGINE 100 UNIT/ML SOLOSTAR PEN: 20.0000 [IU] | PEN_INJECTOR | Freq: Every day | SUBCUTANEOUS | Status: DC

## 2015-10-29 MED ORDER — LIDOCAINE-EPINEPHRINE (PF) 1 %-1:200000 IJ SOLN
INTRAMUSCULAR | Status: DC | PRN
  Administered 2015-10-29: 16 mL

## 2015-10-29 MED ORDER — OXYCODONE HCL 5 MG/5ML PO SOLN: 5.0000 mg | Freq: Once | ORAL | Status: DC | PRN

## 2015-10-29 MED ORDER — ONDANSETRON HCL 4 MG/2ML IJ SOLN: 4.0000 mg | Freq: Once | INTRAMUSCULAR | Status: DC | PRN

## 2015-10-29 MED ORDER — FENTANYL CITRATE (PF) 250 MCG/5ML IJ SOLN
INTRAMUSCULAR | Status: AC
  Filled 2015-10-29: qty 5

## 2015-10-29 MED ORDER — CHLORHEXIDINE GLUCONATE CLOTH 2 % EX PADS: 6.0000 | MEDICATED_PAD | Freq: Once | CUTANEOUS | Status: DC

## 2015-10-29 MED ORDER — METOPROLOL TARTRATE 50 MG PO TABS: 50.0000 mg | ORAL_TABLET | Freq: Two times a day (BID) | ORAL | Status: DC

## 2015-10-29 MED ORDER — LIDOCAINE-EPINEPHRINE (PF) 1 %-1:200000 IJ SOLN
INTRAMUSCULAR | Status: AC
  Filled 2015-10-29: qty 30

## 2015-10-29 MED ORDER — MIDAZOLAM HCL 2 MG/2ML IJ SOLN
INTRAMUSCULAR | Status: AC
  Filled 2015-10-29: qty 2

## 2015-10-29 MED ORDER — FENTANYL CITRATE (PF) 100 MCG/2ML IJ SOLN: 25.0000 ug | INTRAMUSCULAR | Status: DC | PRN

## 2015-10-29 MED ORDER — FENTANYL CITRATE (PF) 100 MCG/2ML IJ SOLN
INTRAMUSCULAR | Status: DC | PRN
  Administered 2015-10-29 (×2): 50 ug via INTRAVENOUS

## 2015-10-29 MED ORDER — SODIUM CHLORIDE 0.9 % IV SOLN
INTRAVENOUS | Status: DC | PRN
  Administered 2015-10-29 (×3): via INTRAVENOUS

## 2015-10-29 MED ORDER — OXYCODONE HCL 5 MG PO TABS: 5.0000 mg | ORAL_TABLET | Freq: Four times a day (QID) | ORAL | Status: DC | PRN

## 2015-10-29 SURGICAL SUPPLY — 24 items
ARMBAND PINK RESTRICT EXTREMIT (MISCELLANEOUS) ×3 IMPLANT
CANISTER SUCTION 2500CC (MISCELLANEOUS) ×3 IMPLANT
CLIP TI MEDIUM 6 (CLIP) ×3 IMPLANT
CLIP TI WIDE RED SMALL 6 (CLIP) ×3 IMPLANT
COVER PROBE W GEL 5X96 (DRAPES) IMPLANT
DRAIN PENROSE 1/4X12 LTX STRL (WOUND CARE) ×3 IMPLANT
ELECT REM PT RETURN 9FT ADLT (ELECTROSURGICAL) ×3
ELECTRODE REM PT RTRN 9FT ADLT (ELECTROSURGICAL) ×1 IMPLANT
GEL ULTRASOUND 20GR AQUASONIC (MISCELLANEOUS) IMPLANT
GLOVE SS BIOGEL STRL SZ 7 (GLOVE) ×1 IMPLANT
GLOVE SUPERSENSE BIOGEL SZ 7 (GLOVE) ×2
GOWN STRL REUS W/ TWL LRG LVL3 (GOWN DISPOSABLE) ×3 IMPLANT
GOWN STRL REUS W/TWL LRG LVL3 (GOWN DISPOSABLE) ×6
KIT BASIN OR (CUSTOM PROCEDURE TRAY) ×3 IMPLANT
KIT ROOM TURNOVER OR (KITS) ×3 IMPLANT
LIQUID BAND (GAUZE/BANDAGES/DRESSINGS) ×3 IMPLANT
NS IRRIG 1000ML POUR BTL (IV SOLUTION) ×3 IMPLANT
PACK CV ACCESS (CUSTOM PROCEDURE TRAY) ×3 IMPLANT
PAD ARMBOARD 7.5X6 YLW CONV (MISCELLANEOUS) ×6 IMPLANT
SUT PROLENE 6 0 BV (SUTURE) ×6 IMPLANT
SUT VIC AB 3-0 SH 27 (SUTURE) ×2
SUT VIC AB 3-0 SH 27X BRD (SUTURE) ×1 IMPLANT
UNDERPAD 30X30 INCONTINENT (UNDERPADS AND DIAPERS) ×3 IMPLANT
WATER STERILE IRR 1000ML POUR (IV SOLUTION) ×3 IMPLANT

## 2015-10-29 NOTE — Interval H&P Note (Signed)
History and Physical Interval Note:  10/29/2015 11:39 AM  Craig Fisher  has presented today for surgery, with the diagnosis of Stage V Chronic Kidney Disease N18.5  The various methods of treatment have been discussed with the patient and family. After consideration of risks, benefits and other options for treatment, the patient has consented to  Procedure(s): RADIOCEPHALIC ARTERIOVENOUS (AV) FISTULA CREATION (Left) as a surgical intervention .  The patient's history has been reviewed, patient examined, no change in status, stable for surgery.  I have reviewed the patient's chart and labs.  Questions were answered to the patient's satisfaction.     Tinnie Gens

## 2015-10-29 NOTE — Telephone Encounter (Signed)
sched lab on 5/31 at 3 and md on 6/5 at 3:30. sched appt w/ pt's daughter in office.

## 2015-10-29 NOTE — Op Note (Signed)
OPERATIVE REPORT  Date of Surgery: 10/29/2015  Surgeon: Tinnie Gens, MD  Assistant: Nurse  Pre-op Diagnosis: Stage V Chronic Kidney Disease N18.5  Post-op Diagnosis: Stage V Chronic Kidney Disease N18.5  Procedure: Procedure(s): BRACHIOCEPHALIC ARTERIOVENOUS (AV) FISTULA CREATION-Left upper extremity Anesthesia: Mac  EBL: Minimal  Complications: None  Procedure Details: The patient taking the operative placed in supine position at which time satisfactory prepping and draping the left upper extremity was performed. The cephalic vein was imaged using B-mode ultrasound-sono site. The cephalic vein was excellent from the shoulder to the antecubital area became quite a bit smaller tapering down the distal third of the forearm.. Did not seem that it was satisfactory for  This pulse fistula creation and therefore brachial cephalic fistula was created. After infiltration forms and Xylocaine with epinephrine short transverse incision was made in the antecubital area. Cephalic vein was dissectown to the basilic branch which was preserved from the forearm. It was transected after ligating it distally with 2-0 silk tie. Gently dilated with heparinized saline was at least 3-1/2 mm in size of good quality. Radial artery was exposed beneath the fascia encircled with Vesseloops. An excellent pulse was 4 mm in size. Artery was occluded proximally and distally open 15 blade extended with Potts scissors. Vein was carefully measured spatulated and anastomosed end to side with 6-0 Prolene. Vesseloops were released there was good pulse and palpable thrill up to the shoulder level with excellent Doppler flow. There was slight diminution of flow in the radial and ulnar arteries distally with the fistula open which improved with compression of the fistula. The fistula was then imaged using the sono site once again a large branch was identified extending laterally about 6 cm proximal to the incision. I was able to  identify this branch ligating and divided from the original incision and no other side branches worsening. Adequate hemostasis was achieved wound closed in layers with Vicryl subcuticular fashion with Dermabond patient taken to recovery room in satisfactory condition   Tinnie Gens, MD 10/29/2015 1:39 PM

## 2015-10-29 NOTE — H&P (View-Only) (Signed)
Subjective:    Patient ID: Craig Fisher, male DOB: 1961/03/05, 55 y.o.   MRN: [id number]   HPI: This 55 year old male was referred for vascular access by Dr.Befacadu. Patient does not speak English but is accompanied by a Optometrist. He is right-handed. He does have diabetes mellitus and takes insulin. He has had progressive renal insufficiency for the last several years. Denies any pain or numbness in his hands. He is not yet on hemodialysis.  Past Medical History  Diagnosis Date  . Hyperlipidemia   . Hypertension   . Full dentures   . Wears glasses   . Blind left eye   . Chronic kidney disease   . Anemia   . Diabetes mellitus without complication [facility])     Social History  Substance Use Topics  . Smoking status: Never Smoker   . Smokeless tobacco: Never Used  . Alcohol Use: No    Family History  Problem Relation Age of Onset  . Diabetes Mother   . Diabetes Sister   . Esophageal cancer Neg Hx   . Colon cancer Neg Hx   . Rectal cancer Neg Hx   . Stomach cancer Neg Hx     No Known Allergies   Current outpatient prescriptions:  .  amLODipine (NORVASC) 10 MG tablet, Take 1 tablet (10 mg total) by mouth daily., Disp: 90 tablet, Rfl: 3 .  aspirin EC 81 MG tablet, Take 1 tablet (81 mg total) by mouth daily., Disp: 90 tablet, Rfl: 1 .  atorvastatin (LIPITOR) 20 MG tablet, Take 1 tablet (20 mg total) by mouth daily., Disp: 90 tablet, Rfl: 2 .  Cholecalciferol (VITAMIN D3) 1000 units CAPS, Take 1,000 Units by mouth daily., Disp: , Rfl:  .  hydrALAZINE (APRESOLINE) 50 MG tablet, , Disp: , Rfl:  .  Insulin Glargine (LANTUS) 100 UNIT/ML Solostar Pen, Inject 25 Units into the skin daily at 10 pm. (Patient taking differently: Inject 20 Units into the skin daily at 10 pm. ), Disp: 15 mL, Rfl: 11 .  Insulin Pen Needle 31G X 4 MM MISC, 1 application by Does not apply route 4 (four) times daily -  before meals and at bedtime., Disp: 100 each, Rfl: 11 .  Insulin Syringe-Needle U-100 (INSULIN  SYRINGE .5CC/31GX5/16") 31G X 5/16" 0.5 ML MISC, Use to injection insulin once daily, Disp: 100 each, Rfl: 1 .  Insulin Syringes, Disposable, U-100 0.5 ML MISC, Use to inject insulin once daily., Disp: 100 each, Rfl: 2 .  metoprolol (LOPRESSOR) 50 MG tablet, Take 50 in AM and 75 mg at bedtime, Disp: 75 tablet, Rfl: 6 .  sodium bicarbonate 650 MG tablet, , Disp: , Rfl:  .  timolol (TIMOPTIC) 0.5 % ophthalmic solution, Per pt, Disp: , Rfl:    ROS:  Denies chest pain, dyspnea on exertion, PND, orthopnea, hemoptysis, claudication. Patient does have blindness in the left eye. Other systems negative and complete review of systems  Objective:  Physical Exam: BP 172/74 mmHg  Pulse 64  Temp(Src) 98 F (36.7 C) (Oral)  Resp 14  Ht 5\' 4"  (1.626 m)  Wt 128 lb (58.06 kg)  BMI 21.96 kg/m2  SpO2 99%  Gen.-alert and oriented x3 in no apparent distress HEENT normal for age Lungs no rhonchi or wheezing Cardiovascular regular rhythm no murmurs carotid pulses 3+ palpable no bruits audible Abdomen soft nontender no palpable masses Musculoskeletal free of  major deformities Skin clear -no rashes Neurologic normal Lower extremities 3+ femoral and dorsalis pedis pulses palpable bilaterally with  no edema Left upper extremity with 3+ brachial and 2+ radial pulse palpable. Cephalic vein appears satisfactory in the forearm and upper arm for fistula creation on  Today I ordered bilateral upper extremity vein mapping and arterial study which I reviewed. Cephalic veins appear to be satisfactory bilateral in the forearm and upper arm.    Assessment:     Chronic kidney disease stage V needs vascular access Diabetes mellitus type 1 Chronic blindness left eye    Plan:     Plan creation left radial-cephalic AV fistula on Wednesday, April 12. Discussed with patient via the interpreter and he would like to proceed on that date

## 2015-10-29 NOTE — Progress Notes (Signed)
Interpreter Lesle Chris for PACU discharge instructions

## 2015-10-29 NOTE — Anesthesia Postprocedure Evaluation (Signed)
Anesthesia Post Note  Patient: Namir Allee  Procedure(s) Performed: Procedure(s) (LRB): BRACHIOCEPHALIC ARTERIOVENOUS (AV) FISTULA CREATION (Left)  Patient location during evaluation: PACU Anesthesia Type: MAC Level of consciousness: awake, awake and alert and oriented Pain management: pain level controlled Respiratory status: spontaneous breathing, nonlabored ventilation, respiratory function stable and patient connected to nasal cannula oxygen Anesthetic complications: no    Last Vitals:  Filed Vitals:   10/29/15 1400 10/29/15 1406  BP: 150/68 132/66  Pulse:  58  Temp:    Resp:  15    Last Pain:  Filed Vitals:   10/29/15 1407  PainSc: 0-No pain                 Lona Six COKER

## 2015-10-29 NOTE — Transfer of Care (Signed)
Immediate Anesthesia Transfer of Care Note  Patient: Craig Fisher  Procedure(s) Performed: Procedure(s): BRACHIOCEPHALIC ARTERIOVENOUS (AV) FISTULA CREATION (Left)  Patient Location: PACU  Anesthesia Type:MAC  Level of Consciousness: awake, alert  and oriented  Airway & Oxygen Therapy: Patient Spontanous Breathing and Patient connected to nasal cannula oxygen  Post-op Assessment: Report given to RN, Post -op Vital signs reviewed and stable and Patient moving all extremities X 4  Post vital signs: Reviewed and stable  Last Vitals:  Filed Vitals:   10/29/15 1031  BP: 192/75  Pulse: 62  Temp: 36.8 C  Resp: 18    Complications: No apparent anesthesia complications

## 2015-10-29 NOTE — Telephone Encounter (Signed)
-----   Message from Mena Goes, RN sent at 10/29/2015  1:40 PM EDT ----- Regarding: schedule   ----- Message -----    From: Gabriel Earing, PA-C    Sent: 10/29/2015  12:44 PM      To: Vvs Charge Pool  S/p fistula.  F/u with JDL in 6 weeks with duplex.  Thanks [employer]

## 2015-10-29 NOTE — Progress Notes (Signed)
Interpreter Craig Fisher for pre-surgeryy

## 2015-10-29 NOTE — Anesthesia Preprocedure Evaluation (Signed)
Anesthesia Evaluation  Patient identified by MRN, date of birth, ID band Patient awake    Reviewed: Allergy & Precautions, NPO status , Patient's Chart, lab work & pertinent test results  Airway Mallampati: II  TM Distance: >3 FB Neck ROM: Full    Dental  (+) Edentulous Upper, Edentulous Lower   Pulmonary    breath sounds clear to auscultation       Cardiovascular hypertension,  Rhythm:Regular Rate:Normal     Neuro/Psych    GI/Hepatic   Endo/Other  diabetes  Renal/GU      Musculoskeletal   Abdominal   Peds  Hematology   Anesthesia Other Findings   Reproductive/Obstetrics                             Anesthesia Physical Anesthesia Plan  ASA: III  Anesthesia Plan: General   Post-op Pain Management:    Induction: Intravenous  Airway Management Planned: Natural Airway and Simple Face Mask  Additional Equipment:   Intra-op Plan:   Post-operative Plan:   Informed Consent: I have reviewed the patients History and Physical, chart, labs and discussed the procedure including the risks, benefits and alternatives for the proposed anesthesia with the patient or authorized representative who has indicated his/her understanding and acceptance.     Plan Discussed with: CRNA and Anesthesiologist  Anesthesia Plan Comments: (ESRD has not started HD K-4.2 Hypertension Type 2 DM glucose 114  Plan MAC  Roberts Gaudy)        Anesthesia Quick Evaluation

## 2015-10-29 NOTE — Discharge Instructions (Signed)
° ° °  10/29/2015 Craig Fisher [id number] 09/28/1960  Surgeon(s): Mal Misty, MD  Procedure(s): Left brachiocephalic AV fistula creation.  x Do not stick fistula for 12 weeks

## 2015-10-30 ENCOUNTER — Encounter (HOSPITAL_COMMUNITY): Payer: Self-pay | Admitting: Vascular Surgery

## 2015-11-04 ENCOUNTER — Ambulatory Visit (INDEPENDENT_AMBULATORY_CARE_PROVIDER_SITE_OTHER): Payer: Managed Care, Other (non HMO) | Admitting: Family Medicine

## 2015-11-04 ENCOUNTER — Encounter: Payer: Self-pay | Admitting: Family Medicine

## 2015-11-04 VITALS — BP 109/55 | HR 67 | Temp 97.6°F | Ht 65.0 in | Wt 146.0 lb

## 2015-11-04 DIAGNOSIS — N185 Chronic kidney disease, stage 5: Secondary | ICD-10-CM | POA: Diagnosis not present

## 2015-11-04 DIAGNOSIS — E8770 Fluid overload, unspecified: Secondary | ICD-10-CM | POA: Diagnosis not present

## 2015-11-04 MED ORDER — FUROSEMIDE 40 MG PO TABS: 40.0000 mg | ORAL_TABLET | Freq: Every day | ORAL | Status: DC

## 2015-11-04 NOTE — Progress Notes (Signed)
Subjective:    Patient ID: Craig Fisher, male    DOB: 02-02-1961, 55 y.o.   MRN: [id number]  HPI Patient here today for SOB, nausea and edema that started on Saturday. Patient complains of dependent edema shortness of breath. He is unable to sleep lying down. Also has some nausea. He has no history of heart disease but does have stage V chronic kidney disease. A vascular shunt was placed last week anticipating dialysis. He reports urinating small volumes. Weight on 412 was 132. Today it's 146 so 14 pound weight gain in the period of less than 1 week. Oxygen saturation is 95%.     Patient Active Problem List   Diagnosis Date Noted  . Chronic kidney disease, stage V ([place]) 10/21/2015  . Chronic kidney disease (CKD) stage G4/A1, severely decreased glomerular filtration rate (GFR) between 15-29 mL/min/1.73 square meter and albuminuria creatinine ratio less than 30 mg/g ([facility]) 04/07/2015  . Edema 08/20/2014  . Diabetes mellitus type 2 with complications, uncontrolled ([place]) 05/06/2014  . Right inguinal hernia 01/23/2014  . Diabetic retinopathy ([place]) 03/30/2013  . HTN (hypertension) 12/09/2012  . HLD (hyperlipidemia) 12/09/2012  . Helicobacter pylori antibody positive 12/09/2012   Outpatient Encounter Prescriptions as of 11/04/2015  Medication Sig  . amLODipine (NORVASC) 10 MG tablet Take 1 tablet (10 mg total) by mouth daily.  Marland Kitchen aspirin EC 81 MG tablet Take 1 tablet (81 mg total) by mouth daily.  Marland Kitchen atorvastatin (LIPITOR) 20 MG tablet Take 1 tablet (20 mg total) by mouth daily.  . Cholecalciferol (VITAMIN D3) 1000 units CAPS Take 1,000 Units by mouth daily.  . hydrALAZINE (APRESOLINE) 50 MG tablet Take 50 mg by mouth 3 (three) times daily.   . Insulin Glargine (LANTUS) 100 UNIT/ML Solostar Pen Inject 20 Units into the skin daily at 10 pm.  . Insulin Pen Needle 31G X 4 MM MISC 1 application by Does not apply route 4 (four) times daily -  before meals and at bedtime.  . Insulin Syringe-Needle  U-100 (INSULIN SYRINGE .5CC/31GX5/16") 31G X 5/16" 0.5 ML MISC Use to injection insulin once daily  . Insulin Syringes, Disposable, U-100 0.5 ML MISC Use to inject insulin once daily.  . metoprolol (LOPRESSOR) 50 MG tablet Take 1-1.5 tablets (50-75 mg total) by mouth 2 (two) times daily. Take 50 in AM and 75 mg at bedtime  . oxyCODONE (ROXICODONE) 5 MG immediate release tablet Take 1 tablet (5 mg total) by mouth every 6 (six) hours as needed.  . sodium bicarbonate 650 MG tablet Take 650 mg by mouth 3 (three) times daily.   . timolol (BETIMOL) 0.5 % ophthalmic solution Place 1 drop into the right eye daily.   No facility-administered encounter medications on file as of 11/04/2015.      Review of Systems  Constitutional: Negative.   HENT: Negative.   Eyes: Negative.   Respiratory: Positive for shortness of breath.   Cardiovascular: Positive for leg swelling.  Gastrointestinal: Positive for nausea.  Endocrine: Negative.   Genitourinary: Negative.   Musculoskeletal: Negative.   Skin: Negative.   Allergic/Immunologic: Negative.   Neurological: Negative.   Hematological: Negative.   Psychiatric/Behavioral: Negative.        Objective:   Physical Exam  Constitutional: He appears well-developed and well-nourished. No distress.  Cardiovascular: Normal rate, regular rhythm and normal heart sounds.   Pulmonary/Chest: Effort normal and breath sounds normal. He has no wheezes. He has no rales.   BP 109/55 mmHg  Pulse 67  Temp(Src) 97.6  F (36.4 C) (Oral)  Ht 5\' 5"  (1.651 m)  Wt 146 lb (66.225 kg)  BMI 24.30 kg/m2  SpO2 95%        Assessment & Plan:  1. Hypervolemia, unspecified hypervolemia type Secondary to poor renal output. He is already salt restricted area I will try furosemide 40 mg. I have no idea whether this will work but if [place] worsen he may need hemodialysis urgently. I do not think he is that symptomatic today. There is no dyspnea,  desaturation. He is to follow-up with nephrologist tomorrow according to what he tells me.

## 2015-11-05 ENCOUNTER — Other Ambulatory Visit: Payer: Managed Care, Other (non HMO)

## 2015-11-05 DIAGNOSIS — R110 Nausea: Secondary | ICD-10-CM

## 2015-11-05 DIAGNOSIS — N185 Chronic kidney disease, stage 5: Secondary | ICD-10-CM

## 2015-11-06 ENCOUNTER — Other Ambulatory Visit: Payer: Self-pay

## 2015-11-06 ENCOUNTER — Encounter (HOSPITAL_COMMUNITY): Payer: Self-pay | Admitting: *Deleted

## 2015-11-06 LAB — HEPATITIS C ANTIBODY: Hep C Virus Ab: <0.1 s/co ratio (ref 0.0–0.9)

## 2015-11-06 LAB — HEPATITIS B SURFACE ANTIBODY,QUALITATIVE: Hep B Surface Ab, Qual: NONREACTIVE

## 2015-11-06 LAB — HEPATITIS B CORE ANTIBODY, TOTAL: Hep B Core Total Ab: NEGATIVE

## 2015-11-06 MED ORDER — SODIUM CHLORIDE 0.9 % IV SOLN
INTRAVENOUS | Status: DC
  Administered 2015-11-07: 09:00:00 via INTRAVENOUS

## 2015-11-06 MED ORDER — DEXTROSE 5 % IV SOLN
1.5000 g | INTRAVENOUS | Status: AC
  Administered 2015-11-07: 1.5 g via INTRAVENOUS
  Filled 2015-11-06: qty 1.5

## 2015-11-06 NOTE — Progress Notes (Signed)
Pt denies SOB, chest pain, and being under the care of a cardiologist. Pt denies having a stress test, echo and cardiac cath. Dr. Bridgett Larsson, on call for Dr. Oneida Alar made aware that pt C/O N/V since previous surgery on 10/29/15 and pt last vomited 2 hours ago; MD advised that pt come for procedure in the A.M. Pt stated that he has not and will not take any Insulin tonight because his BS has been low in the morning from not eating. Pt stated that his fasting BS ranges from 90 - 130 w/o insulin. Pt stated that he cannot take any medications on an empty stomach, he will take his medications after procedure when advised to take Zofran first before BP medications. Pt made aware of diabetes protocol to check BS every 2 hours prior to arrival, interventions for a BS<70 and > 220 and SS phone #. Pt verbalized understanding of all pre-op instructions.

## 2015-11-07 ENCOUNTER — Encounter (HOSPITAL_COMMUNITY)
Admission: RE | Disposition: A | Discharge status: Home / Self Care | Payer: Self-pay | Source: Ambulatory Visit | Attending: Vascular Surgery

## 2015-11-07 ENCOUNTER — Ambulatory Visit (HOSPITAL_COMMUNITY): Payer: Managed Care, Other (non HMO) | Admitting: Anesthesiology

## 2015-11-07 ENCOUNTER — Encounter (HOSPITAL_COMMUNITY): Payer: Self-pay | Admitting: General Practice

## 2015-11-07 ENCOUNTER — Ambulatory Visit (HOSPITAL_COMMUNITY)
Admission: RE | Admit: 2015-11-07 | Discharge: 2015-11-07 | Disposition: A | Discharge status: Home / Self Care | Payer: Managed Care, Other (non HMO) | Source: Ambulatory Visit | Attending: Vascular Surgery | Admitting: Vascular Surgery

## 2015-11-07 ENCOUNTER — Ambulatory Visit (HOSPITAL_COMMUNITY): Payer: Managed Care, Other (non HMO)

## 2015-11-07 DIAGNOSIS — Z7982 Long term (current) use of aspirin: Secondary | ICD-10-CM | POA: Insufficient documentation

## 2015-11-07 DIAGNOSIS — E785 Hyperlipidemia, unspecified: Secondary | ICD-10-CM | POA: Diagnosis not present

## 2015-11-07 DIAGNOSIS — H5442 Blindness, left eye, normal vision right eye: Secondary | ICD-10-CM | POA: Diagnosis not present

## 2015-11-07 DIAGNOSIS — Z794 Long term (current) use of insulin: Secondary | ICD-10-CM | POA: Insufficient documentation

## 2015-11-07 DIAGNOSIS — N186 End stage renal disease: Secondary | ICD-10-CM | POA: Insufficient documentation

## 2015-11-07 DIAGNOSIS — E1122 Type 2 diabetes mellitus with diabetic chronic kidney disease: Secondary | ICD-10-CM | POA: Insufficient documentation

## 2015-11-07 DIAGNOSIS — I120 Hypertensive chronic kidney disease with stage 5 chronic kidney disease or end stage renal disease: Secondary | ICD-10-CM | POA: Diagnosis not present

## 2015-11-07 DIAGNOSIS — Z79899 Other long term (current) drug therapy: Secondary | ICD-10-CM | POA: Diagnosis not present

## 2015-11-07 DIAGNOSIS — Z452 Encounter for adjustment and management of vascular access device: Secondary | ICD-10-CM

## 2015-11-07 DIAGNOSIS — Z419 Encounter for procedure for purposes other than remedying health state, unspecified: Secondary | ICD-10-CM

## 2015-11-07 DIAGNOSIS — N185 Chronic kidney disease, stage 5: Secondary | ICD-10-CM | POA: Diagnosis not present

## 2015-11-07 HISTORY — PX: INSERTION OF DIALYSIS CATHETER: SHX1324

## 2015-11-07 HISTORY — DX: Reserved for inherently not codable concepts without codable children: IMO0001

## 2015-11-07 LAB — GLUCOSE, CAPILLARY
GLUCOSE-CAPILLARY: 151 mg/dL — AB (ref 65–99)
GLUCOSE-CAPILLARY: 118 mg/dL — AB (ref 65–99)
Glucose-Capillary: 145 mg/dL — ABNORMAL HIGH (ref 65–99)
Glucose-Capillary: 126 mg/dL — ABNORMAL HIGH (ref 65–99)

## 2015-11-07 LAB — POCT I-STAT 4, (NA,K, GLUC, HGB,HCT)
GLUCOSE: 156 mg/dL — AB (ref 65–99)
HEMATOCRIT: 22 % — AB (ref 39.0–52.0)
Hemoglobin: 7.5 g/dL — ABNORMAL LOW (ref 13.0–17.0)
Potassium: 3.9 mmol/L (ref 3.5–5.1)
Sodium: 132 mmol/L — ABNORMAL LOW (ref 135–145)

## 2015-11-07 SURGERY — INSERTION OF DIALYSIS CATHETER
Anesthesia: Monitor Anesthesia Care | Site: Neck

## 2015-11-07 MED ORDER — SODIUM CHLORIDE 0.9 % IV SOLN
INTRAVENOUS | Status: DC | PRN
  Administered 2015-11-07: 500 mL

## 2015-11-07 MED ORDER — FENTANYL CITRATE (PF) 100 MCG/2ML IJ SOLN: 25.0000 ug | INTRAMUSCULAR | Status: DC | PRN

## 2015-11-07 MED ORDER — SUCCINYLCHOLINE CHLORIDE 20 MG/ML IJ SOLN
INTRAMUSCULAR | Status: AC
  Filled 2015-11-07: qty 1

## 2015-11-07 MED ORDER — LIDOCAINE HCL (PF) 1 % IJ SOLN
INTRAMUSCULAR | Status: AC
  Filled 2015-11-07: qty 30

## 2015-11-07 MED ORDER — LIDOCAINE HCL (CARDIAC) 20 MG/ML IV SOLN
INTRAVENOUS | Status: AC
  Filled 2015-11-07: qty 5

## 2015-11-07 MED ORDER — CHLORHEXIDINE GLUCONATE CLOTH 2 % EX PADS: 6.0000 | MEDICATED_PAD | Freq: Once | CUTANEOUS | Status: DC

## 2015-11-07 MED ORDER — HEPARIN SODIUM (PORCINE) 1000 UNIT/ML IJ SOLN
INTRAMUSCULAR | Status: AC
  Filled 2015-11-07: qty 1

## 2015-11-07 MED ORDER — DEXMEDETOMIDINE HCL IN NACL 400 MCG/100ML IV SOLN
INTRAVENOUS | Status: DC | PRN
  Administered 2015-11-07: .6 ug/kg/h via INTRAVENOUS

## 2015-11-07 MED ORDER — GLYCOPYRROLATE 0.2 MG/ML IJ SOLN
INTRAMUSCULAR | Status: AC
  Filled 2015-11-07: qty 1

## 2015-11-07 MED ORDER — LIDOCAINE HCL (PF) 1 % IJ SOLN
INTRAMUSCULAR | Status: DC | PRN
  Administered 2015-11-07: 10 mL

## 2015-11-07 MED ORDER — 0.9 % SODIUM CHLORIDE (POUR BTL) OPTIME
TOPICAL | Status: DC | PRN
  Administered 2015-11-07: 1000 mL

## 2015-11-07 MED ORDER — ONDANSETRON HCL 4 MG/2ML IJ SOLN
INTRAMUSCULAR | Status: DC | PRN
  Administered 2015-11-07: 4 mg via INTRAVENOUS

## 2015-11-07 MED ORDER — FENTANYL CITRATE (PF) 250 MCG/5ML IJ SOLN
INTRAMUSCULAR | Status: AC
Start: 2015-11-07 — End: 2015-11-07
  Filled 2015-11-07: qty 5

## 2015-11-07 MED ORDER — SODIUM CHLORIDE 0.9 % IJ SOLN
INTRAMUSCULAR | Status: AC
  Filled 2015-11-07: qty 10

## 2015-11-07 MED ORDER — FENTANYL CITRATE (PF) 100 MCG/2ML IJ SOLN
INTRAMUSCULAR | Status: DC | PRN
  Administered 2015-11-07 (×2): 50 ug via INTRAVENOUS

## 2015-11-07 MED ORDER — HEPARIN SODIUM (PORCINE) 1000 UNIT/ML IJ SOLN
INTRAMUSCULAR | Status: DC | PRN
  Administered 2015-11-07: 5000 [IU] via INTRAVENOUS

## 2015-11-07 MED ORDER — MIDAZOLAM HCL 2 MG/2ML IJ SOLN
INTRAMUSCULAR | Status: AC
  Filled 2015-11-07: qty 2

## 2015-11-07 MED ORDER — ROCURONIUM BROMIDE 50 MG/5ML IV SOLN
INTRAVENOUS | Status: AC
  Filled 2015-11-07: qty 1

## 2015-11-07 MED ORDER — MIDAZOLAM HCL 5 MG/5ML IJ SOLN
INTRAMUSCULAR | Status: DC | PRN
  Administered 2015-11-07 (×2): 1 mg via INTRAVENOUS

## 2015-11-07 MED ORDER — METOPROLOL TARTRATE 50 MG PO TABS
50.0000 mg | ORAL_TABLET | Freq: Once | ORAL | Status: AC
  Administered 2015-11-07: 50 mg via ORAL

## 2015-11-07 MED ORDER — DEXMEDETOMIDINE HCL IN NACL 200 MCG/50ML IV SOLN
INTRAVENOUS | Status: AC
  Filled 2015-11-07: qty 50

## 2015-11-07 MED ORDER — EPHEDRINE SULFATE 50 MG/ML IJ SOLN
INTRAMUSCULAR | Status: AC
  Filled 2015-11-07: qty 1

## 2015-11-07 MED ORDER — ONDANSETRON HCL 4 MG/2ML IJ SOLN: 4.0000 mg | Freq: Once | INTRAMUSCULAR | Status: DC | PRN

## 2015-11-07 MED ORDER — METOPROLOL TARTRATE 50 MG PO TABS
ORAL_TABLET | ORAL | Status: AC
  Filled 2015-11-07: qty 1

## 2015-11-07 MED ORDER — PROPOFOL 10 MG/ML IV BOLUS
INTRAVENOUS | Status: AC
  Filled 2015-11-07: qty 20

## 2015-11-07 SURGICAL SUPPLY — 41 items
BAG DECANTER FOR FLEXI CONT (MISCELLANEOUS) ×3 IMPLANT
BIOPATCH RED 1 DISK 7.0 (GAUZE/BANDAGES/DRESSINGS) ×2 IMPLANT
BIOPATCH RED 1IN DISK 7.0MM (GAUZE/BANDAGES/DRESSINGS) ×1
CATH CANNON HEMO 15FR 23CM (HEMODIALYSIS SUPPLIES) ×3 IMPLANT
CATH PALINDROME RT-P 15FX19CM (CATHETERS) IMPLANT
CATH PALINDROME RT-P 15FX23CM (CATHETERS) IMPLANT
CATH PALINDROME RT-P 15FX28CM (CATHETERS) IMPLANT
CATH PALINDROME RT-P 15FX55CM (CATHETERS) IMPLANT
CATH STRAIGHT 5FR 65CM (CATHETERS) IMPLANT
CHLORAPREP W/TINT 26ML (MISCELLANEOUS) ×3 IMPLANT
COVER PROBE W GEL 5X96 (DRAPES) ×3 IMPLANT
DECANTER SPIKE VIAL GLASS SM (MISCELLANEOUS) IMPLANT
DRAPE C-ARM 42X72 X-RAY (DRAPES) ×3 IMPLANT
DRAPE CHEST BREAST 15X10 FENES (DRAPES) ×3 IMPLANT
GAUZE SPONGE 2X2 8PLY STRL LF (GAUZE/BANDAGES/DRESSINGS) ×1 IMPLANT
GAUZE SPONGE 4X4 16PLY XRAY LF (GAUZE/BANDAGES/DRESSINGS) ×3 IMPLANT
GLOVE BIO SURGEON STRL SZ7.5 (GLOVE) ×3 IMPLANT
GLOVE BIOGEL PI IND STRL 6.5 (GLOVE) ×3 IMPLANT
GLOVE BIOGEL PI INDICATOR 6.5 (GLOVE) ×6
GLOVE ECLIPSE 6.5 STRL STRAW (GLOVE) ×3 IMPLANT
GOWN STRL REUS W/ TWL LRG LVL3 (GOWN DISPOSABLE) ×3 IMPLANT
GOWN STRL REUS W/TWL LRG LVL3 (GOWN DISPOSABLE) ×6
KIT BASIN OR (CUSTOM PROCEDURE TRAY) ×3 IMPLANT
KIT ROOM TURNOVER OR (KITS) ×3 IMPLANT
LIQUID BAND (GAUZE/BANDAGES/DRESSINGS) ×3 IMPLANT
NEEDLE 18GX1X1/2 (RX/OR ONLY) (NEEDLE) ×3 IMPLANT
NEEDLE HYPO 25GX1X1/2 BEV (NEEDLE) ×3 IMPLANT
NS IRRIG 1000ML POUR BTL (IV SOLUTION) ×3 IMPLANT
PACK SURGICAL SETUP 50X90 (CUSTOM PROCEDURE TRAY) ×3 IMPLANT
PAD ARMBOARD 7.5X6 YLW CONV (MISCELLANEOUS) ×6 IMPLANT
SET MICROPUNCTURE 5F STIFF (MISCELLANEOUS) IMPLANT
SPONGE GAUZE 2X2 STER 10/PKG (GAUZE/BANDAGES/DRESSINGS) ×2
SUT ETHILON 3 0 PS 1 (SUTURE) ×3 IMPLANT
SUT VICRYL 4-0 PS2 18IN ABS (SUTURE) ×3 IMPLANT
SYR 20CC LL (SYRINGE) ×6 IMPLANT
SYR 5ML LL (SYRINGE) ×3 IMPLANT
SYR CONTROL 10ML LL (SYRINGE) ×3 IMPLANT
SYRINGE 10CC LL (SYRINGE) ×3 IMPLANT
TAPE CLOTH SURG 4X10 WHT LF (GAUZE/BANDAGES/DRESSINGS) ×3 IMPLANT
WATER STERILE IRR 1000ML POUR (IV SOLUTION) IMPLANT
WIRE AMPLATZ SS-J .035X180CM (WIRE) IMPLANT

## 2015-11-07 NOTE — H&P (View-Only) (Signed)
Subjective:    Patient ID: Craig Fisher, male DOB: Aug 26, 1960, 55 y.o.   MRN: [id number]   HPI: This 55 year old male was referred for vascular access by Dr.Befacadu. Patient does not speak English but is accompanied by a Optometrist. He is right-handed. He does have diabetes mellitus and takes insulin. He has had progressive renal insufficiency for the last several years. Denies any pain or numbness in his hands. He is not yet on hemodialysis.  Past Medical History  Diagnosis Date  . Hyperlipidemia   . Hypertension   . Full dentures   . Wears glasses   . Blind left eye   . Chronic kidney disease   . Anemia   . Diabetes mellitus without complication [facility])     Social History  Substance Use Topics  . Smoking status: Never Smoker   . Smokeless tobacco: Never Used  . Alcohol Use: No    Family History  Problem Relation Age of Onset  . Diabetes Mother   . Diabetes Sister   . Esophageal cancer Neg Hx   . Colon cancer Neg Hx   . Rectal cancer Neg Hx   . Stomach cancer Neg Hx     No Known Allergies   Current outpatient prescriptions:  .  amLODipine (NORVASC) 10 MG tablet, Take 1 tablet (10 mg total) by mouth daily., Disp: 90 tablet, Rfl: 3 .  aspirin EC 81 MG tablet, Take 1 tablet (81 mg total) by mouth daily., Disp: 90 tablet, Rfl: 1 .  atorvastatin (LIPITOR) 20 MG tablet, Take 1 tablet (20 mg total) by mouth daily., Disp: 90 tablet, Rfl: 2 .  Cholecalciferol (VITAMIN D3) 1000 units CAPS, Take 1,000 Units by mouth daily., Disp: , Rfl:  .  hydrALAZINE (APRESOLINE) 50 MG tablet, , Disp: , Rfl:  .  Insulin Glargine (LANTUS) 100 UNIT/ML Solostar Pen, Inject 25 Units into the skin daily at 10 pm. (Patient taking differently: Inject 20 Units into the skin daily at 10 pm. ), Disp: 15 mL, Rfl: 11 .  Insulin Pen Needle 31G X 4 MM MISC, 1 application by Does not apply route 4 (four) times daily -  before meals and at bedtime., Disp: 100 each, Rfl: 11 .  Insulin Syringe-Needle U-100 (INSULIN  SYRINGE .5CC/31GX5/16") 31G X 5/16" 0.5 ML MISC, Use to injection insulin once daily, Disp: 100 each, Rfl: 1 .  Insulin Syringes, Disposable, U-100 0.5 ML MISC, Use to inject insulin once daily., Disp: 100 each, Rfl: 2 .  metoprolol (LOPRESSOR) 50 MG tablet, Take 50 in AM and 75 mg at bedtime, Disp: 75 tablet, Rfl: 6 .  sodium bicarbonate 650 MG tablet, , Disp: , Rfl:  .  timolol (TIMOPTIC) 0.5 % ophthalmic solution, Per pt, Disp: , Rfl:    ROS:  Denies chest pain, dyspnea on exertion, PND, orthopnea, hemoptysis, claudication. Patient does have blindness in the left eye. Other systems negative and complete review of systems  Objective:  Physical Exam: BP 172/74 mmHg  Pulse 64  Temp(Src) 98 F (36.7 C) (Oral)  Resp 14  Ht 5\' 4"  (1.626 m)  Wt 128 lb (58.06 kg)  BMI 21.96 kg/m2  SpO2 99%  Gen.-alert and oriented x3 in no apparent distress HEENT normal for age Lungs no rhonchi or wheezing Cardiovascular regular rhythm no murmurs carotid pulses 3+ palpable no bruits audible Abdomen soft nontender no palpable masses Musculoskeletal free of  major deformities Skin clear -no rashes Neurologic normal Lower extremities 3+ femoral and dorsalis pedis pulses palpable bilaterally with  no edema Left upper extremity with 3+ brachial and 2+ radial pulse palpable. Cephalic vein appears satisfactory in the forearm and upper arm for fistula creation on  Today I ordered bilateral upper extremity vein mapping and arterial study which I reviewed. Cephalic veins appear to be satisfactory bilateral in the forearm and upper arm.    Assessment:     Chronic kidney disease stage V needs vascular access Diabetes mellitus type 1 Chronic blindness left eye    Plan:     Plan creation left radial-cephalic AV fistula on Wednesday, April 12. Discussed with patient via the interpreter and he would like to proceed on that date

## 2015-11-07 NOTE — Anesthesia Preprocedure Evaluation (Addendum)
Anesthesia Evaluation  Patient identified by MRN, date of birth, ID band Patient awake    Reviewed: Allergy & Precautions, NPO status , Patient's Chart, lab work & pertinent test results, reviewed documented beta blocker date and time   Airway Mallampati: II  TM Distance: >3 FB Neck ROM: Full    Dental  (+) Dental Advisory Given, Edentulous Upper, Edentulous Lower   Pulmonary neg pulmonary ROS,    Pulmonary exam normal breath sounds clear to auscultation       Cardiovascular hypertension, Pt. on medications and Pt. on home beta blockers Normal cardiovascular exam Rhythm:Regular Rate:Normal     Neuro/Psych  Headaches, Blind left eye  negative psych ROS   GI/Hepatic negative GI ROS, Neg liver ROS,   Endo/Other  diabetes, Type 2, Insulin Dependent  Renal/GU ESRFRenal diseaseK+ 3.9     Musculoskeletal negative musculoskeletal ROS (+)   Abdominal   Peds  Hematology  (+) Blood dyscrasia, anemia ,   Anesthesia Other Findings Day of surgery medications reviewed with the patient.  Reproductive/Obstetrics                                                           Anesthesia Evaluation  Patient identified by MRN, date of birth, ID band Patient awake    Reviewed: Allergy & Precautions, NPO status , Patient's Chart, lab work & pertinent test results  Airway Mallampati: II  TM Distance: >3 FB Neck ROM: Full    Dental  (+) Edentulous Upper, Edentulous Lower   Pulmonary    breath sounds clear to auscultation       Cardiovascular hypertension,  Rhythm:Regular Rate:Normal     Neuro/Psych    GI/Hepatic   Endo/Other  diabetes  Renal/GU      Musculoskeletal   Abdominal   Peds  Hematology   Anesthesia Other Findings   Reproductive/Obstetrics                             Anesthesia Physical Anesthesia Plan  ASA: III  Anesthesia Plan: General    Post-op Pain Management:    Induction: Intravenous  Airway Management Planned: Natural Airway and Simple Face Mask  Additional Equipment:   Intra-op Plan:   Post-operative Plan:   Informed Consent: I have reviewed the patients History and Physical, chart, labs and discussed the procedure including the risks, benefits and alternatives for the proposed anesthesia with the patient or authorized representative who has indicated his/her understanding and acceptance.     Plan Discussed with: CRNA and Anesthesiologist  Anesthesia Plan Comments: (ESRD has not started HD K-4.2 Hypertension Type 2 DM glucose 114  Plan MAC  Roberts Gaudy)        Anesthesia Quick Evaluation  Anesthesia Physical Anesthesia Plan  ASA: III  Anesthesia Plan: MAC   Post-op Pain Management:    Induction: Intravenous  Airway Management Planned: Nasal Cannula  Additional Equipment:   Intra-op Plan:   Post-operative Plan:   Informed Consent: I have reviewed the patients History and Physical, chart, labs and discussed the procedure including the risks, benefits and alternatives for the proposed anesthesia with the patient or authorized representative who has indicated his/her understanding and acceptance.   Dental advisory given  Plan Discussed with: CRNA and Anesthesiologist  Anesthesia Plan  Comments: (Discussed risks/benefits/alternatives to MAC sedation including need for ventilatory support, hypotension, need for conversion to general anesthesia.  All patient questions answered.  Patient/guardian wishes to proceed.  **Language interpreter was utilized for the entire encounter.**)       Anesthesia Quick Evaluation                                  Anesthesia Evaluation  Patient identified by MRN, date of birth, ID band Patient awake    Reviewed: Allergy & Precautions, NPO status , Patient's Chart, lab work & pertinent test results  Airway Mallampati: II  TM Distance: >3  FB Neck ROM: Full    Dental  (+) Edentulous Upper, Edentulous Lower   Pulmonary    breath sounds clear to auscultation       Cardiovascular hypertension,  Rhythm:Regular Rate:Normal     Neuro/Psych    GI/Hepatic   Endo/Other  diabetes  Renal/GU      Musculoskeletal   Abdominal   Peds  Hematology   Anesthesia Other Findings   Reproductive/Obstetrics                             Anesthesia Physical Anesthesia Plan  ASA: III  Anesthesia Plan: General   Post-op Pain Management:    Induction: Intravenous  Airway Management Planned: Natural Airway and Simple Face Mask  Additional Equipment:   Intra-op Plan:   Post-operative Plan:   Informed Consent: I have reviewed the patients History and Physical, chart, labs and discussed the procedure including the risks, benefits and alternatives for the proposed anesthesia with the patient or authorized representative who has indicated his/her understanding and acceptance.     Plan Discussed with: CRNA and Anesthesiologist  Anesthesia Plan Comments: (ESRD has not started HD K-4.2 Hypertension Type 2 DM glucose 114  Plan MAC  Roberts Gaudy)        Anesthesia Quick Evaluation

## 2015-11-07 NOTE — Op Note (Signed)
Procedure: Ultrasound-guided insertion of Diatek catheter right internal jugular vein  Preoperative diagnosis: End-stage renal disease  Postoperative diagnosis: Same  Anesthesia: Local with IV sedation  Operative findings: 23 cm Diatek catheter right internal jugular vein  Operative details: After obtaining informed consent, the patient was taken to the operating room. The patient was placed in supine position on the operating room table. After adequate sedation the patient's entire neck and chest were prepped and draped in usual sterile fashion. The patient was placed in Trendelenburg position. Ultrasound was used to identify the patient's right internal jugular vein. This had normal compressibility and respiratory variation. Local anesthesia was infiltrated over the right jugular vein.  Using ultrasound guidance, the right internal jugular vein was successfully cannulated.  A 0.035 J-tipped guidewire was threaded into the right internal jugular vein and into the superior vena cava followed by the inferior vena cava under fluoroscopic guidance.   Next sequential 12 and 14 dilators were placed over the guidewire into the right atrium.  A 16 French dilator with a peel-away sheath was then placed over the guidewire into the right atrium.   The guidewire and dilator were removed. A 23 cm Diatek catheter was then placed through the peel away sheath into the right atrium.  The catheter was then tunneled subcutaneously, cut to length, and the hub attached. The catheter was noted to flush and draw easily. The catheter was inspected under fluoroscopy and found with its tip to be in the right atrium without any kinks throughout its course. The catheter was sutured to the skin with nylon sutures. The neck insertion site was closed with Vicryl stitch. The catheter was then loaded with concentrated Heparin solution. A dry sterile dressing was applied.  The patient tolerated procedure well and there were no  complications. Instrument sponge and needle counts were correct at the end of the case. The patient was taken to the recovery room in stable condition. Chest x-ray will be obtained in the recovery room.  [name], MD [facility] Office: [phone] Pager: [phone]

## 2015-11-07 NOTE — Anesthesia Procedure Notes (Signed)
Procedure Name: MAC Date/Time: 11/07/2015 12:45 PM Performed by: Layla Maw Pre-anesthesia Checklist: Patient identified, Patient being monitored, Timeout performed, Emergency Drugs available and Suction available Patient Re-evaluated:Patient Re-evaluated prior to inductionOxygen Delivery Method: Simple face mask Preoxygenation: Pre-oxygenation with 100% oxygen Number of attempts: 1 Placement Confirmation: positive ETCO2 Dental Injury: Teeth and Oropharynx as per pre-operative assessment

## 2015-11-07 NOTE — Transfer of Care (Signed)
Immediate Anesthesia Transfer of Care Note  Patient: Craig Fisher  Procedure(s) Performed: Procedure(s): INSERTION OF DIALYSIS CATHETER (N/A)  Patient Location: PACU  Anesthesia Type:MAC  Level of Consciousness: awake, alert , oriented and patient cooperative  Airway & Oxygen Therapy: Patient Spontanous Breathing and Patient connected to face mask oxygen  Post-op Assessment: Report given to RN, Post -op Vital signs reviewed and stable and Patient moving all extremities X 4  Post vital signs: Reviewed and stable  Last Vitals:  Filed Vitals:   11/07/15 0835  BP: 133/53  Pulse: 65  Temp: 36.8 C  Resp: 16    Complications: No apparent anesthesia complications

## 2015-11-07 NOTE — Progress Notes (Signed)
Craig Fisher to be D/C'd home per MD order. Discussed with the patient and all questions fully answered.  VSS. Hemodialysis catheter site clean, dry, intact with dressing in place.  IV catheter discontinued intact. Site without signs and symptoms of complications. Dressing and pressure applied.  An After Visit Summary was printed and given to the patient. This was discussed at bedside with patient's daughter and Spero Geralds, [place].  D/c education completed with patient/family including follow up instructions, medication list, d/c activities limitations if indicated, with other d/c instructions as indicated by MD - patient able to verbalize understanding, all questions fully answered.   Patient instructed to return to ED, call 911, or call MD for any changes in condition.   Patient to be escorted via [place], and D/C home via private auto.

## 2015-11-07 NOTE — Interval H&P Note (Signed)
History and Physical Interval Note:  11/07/2015 10:38 AM  Craig Fisher  has presented today for surgery, with the diagnosis of End Stage Renal Disease N18.6  The various methods of treatment have been discussed with the patient and family. After consideration of risks, benefits and other options for treatment, the patient has consented to  Procedure(s): INSERTION OF DIALYSIS CATHETER (N/A) as a surgical intervention .  The patient's history has been reviewed, patient examined, no change in status, stable for surgery.  I have reviewed the patient's chart and labs.  Questions were answered to the patient's satisfaction.   Fistula not ready for use yet.  Tunneled catheter today.  [name], MD [facility] Office: [phone] Pager: [phone]     [name]

## 2015-11-07 NOTE — Anesthesia Postprocedure Evaluation (Signed)
Anesthesia Post Note  Patient: Craig Fisher  Procedure(s) Performed: Procedure(s) (LRB): INSERTION OF DIALYSIS CATHETER (N/A)  Patient location during evaluation: PACU Anesthesia Type: MAC Level of consciousness: awake and alert Pain management: pain level controlled Vital Signs Assessment: post-procedure vital signs reviewed and stable Respiratory status: spontaneous breathing, nonlabored ventilation, respiratory function stable and patient connected to nasal cannula oxygen Cardiovascular status: stable and blood pressure returned to baseline Anesthetic complications: no    Last Vitals:  Filed Vitals:   11/07/15 1414 11/07/15 1429  BP: 112/57 112/56  Pulse: 56 56  Temp:  36.5 C  Resp: 13 12    Last Pain:  Filed Vitals:   11/07/15 1439  PainSc: 7                  Catalina Gravel

## 2015-11-07 NOTE — Progress Notes (Signed)
Interpreter Lesle Chris for pre surgery RN Mendel Ryder

## 2015-11-08 LAB — QUANTIFERON IN TUBE
QUANTIFERON MITOGEN VALUE: 1.46 [IU]/mL
QUANTIFERON NIL VALUE: 0.03 [IU]/mL
QUANTIFERON TB AG VALUE: 0.01 [IU]/mL
QUANTIFERON TB GOLD: NEGATIVE

## 2015-11-08 LAB — QUANTIFERON TB GOLD ASSAY (BLOOD)

## 2015-11-10 ENCOUNTER — Encounter (HOSPITAL_COMMUNITY): Payer: Self-pay | Admitting: Vascular Surgery

## 2015-11-10 ENCOUNTER — Ambulatory Visit: Payer: Managed Care, Other (non HMO) | Admitting: Family

## 2015-11-11 ENCOUNTER — Encounter: Payer: Self-pay | Admitting: Family

## 2015-12-05 ENCOUNTER — Encounter: Payer: Self-pay | Admitting: Family

## 2015-12-05 ENCOUNTER — Other Ambulatory Visit: Payer: Self-pay

## 2015-12-05 ENCOUNTER — Ambulatory Visit (INDEPENDENT_AMBULATORY_CARE_PROVIDER_SITE_OTHER): Payer: Managed Care, Other (non HMO) | Admitting: Family

## 2015-12-05 VITALS — BP 123/68 | HR 75 | Temp 98.1°F | Ht 65.0 in | Wt 130.2 lb

## 2015-12-05 DIAGNOSIS — E1165 Type 2 diabetes mellitus with hyperglycemia: Secondary | ICD-10-CM

## 2015-12-05 DIAGNOSIS — Z794 Long term (current) use of insulin: Secondary | ICD-10-CM | POA: Diagnosis not present

## 2015-12-05 DIAGNOSIS — E11319 Type 2 diabetes mellitus with unspecified diabetic retinopathy without macular edema: Secondary | ICD-10-CM

## 2015-12-05 DIAGNOSIS — N186 End stage renal disease: Secondary | ICD-10-CM

## 2015-12-05 DIAGNOSIS — I10 Essential (primary) hypertension: Secondary | ICD-10-CM | POA: Diagnosis not present

## 2015-12-05 DIAGNOSIS — E785 Hyperlipidemia, unspecified: Secondary | ICD-10-CM | POA: Diagnosis not present

## 2015-12-05 DIAGNOSIS — IMO0002 Reserved for concepts with insufficient information to code with codable children: Secondary | ICD-10-CM

## 2015-12-05 DIAGNOSIS — N184 Chronic kidney disease, stage 4 (severe): Secondary | ICD-10-CM

## 2015-12-05 DIAGNOSIS — E118 Type 2 diabetes mellitus with unspecified complications: Secondary | ICD-10-CM

## 2015-12-05 DIAGNOSIS — Z992 Dependence on renal dialysis: Secondary | ICD-10-CM

## 2015-12-05 DIAGNOSIS — N185 Chronic kidney disease, stage 5: Secondary | ICD-10-CM | POA: Diagnosis not present

## 2015-12-05 LAB — BAYER DCA HB A1C WAIVED: HB A1C: 6 % (ref <7.0)

## 2015-12-05 MED ORDER — HYDRALAZINE HCL 50 MG PO TABS: 50.0000 mg | ORAL_TABLET | Freq: Three times a day (TID) | ORAL | Status: DC

## 2015-12-05 MED ORDER — AMLODIPINE BESYLATE 10 MG PO TABS: 10.0000 mg | ORAL_TABLET | Freq: Every day | ORAL | Status: DC

## 2015-12-05 NOTE — Patient Instructions (Signed)
Mantenimiento de Technical sales engineer - Hombres (Health Maintenance, Male) Un estilo de vida saludable y los cuidados preventivos pueden favorecer la salud y [place].  No deje de [employer] de rutina de la salud, dentales y de Public librarian.  Consuma una dieta saludable. Los [employer], frutas, cereales integrales, productos lcteos descremados y protenas magras contienen los nutrientes que usted necesita y no tienen muchas caloras. Disminuya la ingesta de alimentos ricos en grasas slidas, azcares y sal agregadas. Si es necesario, pdale informacin acerca de una dieta [place] a su mdico.  Realizar actividad fsica de forma regular es una de las prcticas ms importantes que puede hacer por su salud. Los adultos deben hacer al menos 150 minutos de ejercicios de intensidad moderada (cualquier actividad que aumente la frecuencia cardaca y lo haga transpirar) cada semana. Adems, la [employer] de los adultos necesita practicar ejercicios de fortalecimiento muscular dos o ms veces por semana.  Mantenga un peso saludable. El ndice de masa corporal [facility]) es una herramienta que identifica posibles problemas con [place]. Proporciona una estimacin de la grasa corporal basndose en el peso y la altura. El mdico podr determinar su [facility] y ayudarlo a Scientist, forensic o Theatre manager un peso saludable. Para los adultos mayores de 20aos:  Un [facility] menor de 18,5 se considera bajo peso.  Un [facility] entre 18,5 y 24,9 es normal.  Un [facility] entre 25 y 29,9 se considera sobrepeso.  Un I[facility] de 30 o ms se considera obesidad.  Mantenga un nivel normal de lpidos y colesterol en la sangre practicando actividad fsica y minimizando la ingesta de grasas saturadas. Consuma una dieta balanceada e incluya variedad de frutas y vegetales. A partir de los 20 aos se deben realizar anlisis de sangre a fin de Freight forwarder nivel de lpidos y colesterol en la sangre y [place] cada 5 aos. Si los niveles de colesterol son altos,  tiene ms de 50 aos o tiene riesgo elevado de sufrir enfermedades cardacas, Designer, industrial/product controlarse con ms frecuencia. Si tiene [employer] de lpidos y colesterol, debe recibir tratamiento con medicamentos, si la dieta y el ejercicio no estn funcionando.  Si fuma, consulte con el mdico acerca de las opciones para dejar de [place]. Si no fuma, no comience.  Se recomienda realizar exmenes de deteccin de cncer de pulmn a personas adultas entre 74 y 90 aos que estn en riesgo de Horticulturist, commercial de pulmn por sus antecedentes de consumo de tabaco. Para quienes hayan fumado durante 30 aos un paquete diario, y sigan fumando o hayan dejado el hbito en algn momento en los ltimos 15 aos, se recomienda realizarse una tomografa computada de baja dosis de los pulmones todos los [place]. Fumar un paquete por ao equivale a fumar un promedio de un paquete de cigarrillos diario durante un ao (por ejemplo, fumar 30paquetes por ao podra significar fumar un paquete de cigarrillos diario durante 30aos o 2paquetes diarios durante 15aos). Los exmenes anuales deben continuar hasta que el fumador haya dejado de fumar durante un mnimo de 15 aos. Ya no deben Emergency planning/management officer que tengan un problema de salud que les impida recibir tratamiento para el cncer de pulmn.  Si decide tomar alcohol, no beba ms de [employer]. Se considera una medida 12onzas (372m) de cerveza, 5onzas (1551m de vino o 1,6,[id number] licor.  Evite el consumo de drogas. No comparta agujas. Pida ayuda si necesita asistencia o instrucciones con respecto a abandonar el consumo de drogas.  La  hipertensin arterial causa enfermedades cardacas y aumenta el riesgo de ictus. La hipertensin arterial es ms probable en los siguientes casos:  Las personas que tienen la presin arterial en el extremo del rango normal (100-139/85-89 mm Hg).  Las personas con sobrepeso u obesidad.  Las personas  afroamericanas.  Si usted tiene entre 18 y 39 aos, debe medirse la presin arterial cada 3 a 5 aos. Si usted tiene 40 aos o ms, debe medirse la presin arterial todos los aos. Debe medirse la presin arterial dos veces: una vez cuando est en un hospital o una clnica y la otra vez cuando est en otro sitio. Registre el promedio de las dos mediciones. Para controlar su presin arterial cuando no est en un hospital o una clnica, puede usar lo siguiente:  Una mquina automtica para medir la presin arterial en una farmacia.  Un monitor para medir la presin arterial en el hogar.  Si tiene entre 45 y 79 aos, consulte a su mdico si debe tomar aspirina para prevenir enfermedades cardacas.  Los anlisis para la diabetes incluyen la toma de una muestra de sangre para controlar el nivel de azcar en la sangre durante el ayuno. Debe hacerlos cada 3aos despus de los 45aos si su peso es normal y no tiene factores de riesgo de diabetes. Las pruebas deben comenzar a edades tempranas o llevarse a cabo con ms frecuencia si tiene sobrepeso y al menos un factor de riesgo para la diabetes.  El cncer colorrectal puede detectarse y con frecuencia puede prevenirse. La mayor parte de los estudios de rutina se deben comenzar a hacer a partir de los 50 aos y hasta los 75 aos. Sin embargo, el mdico podr aconsejarle que lo haga antes, si tiene factores de riesgo para el cncer de colon. Una vez por ao, el mdico le dar un kit de prueba para hallar sangre oculta en la materia fecal. Es posible que se use una pequea cmara en el extremo de un tubo para examinar directamente el colon (sigmoidoscopa o colonoscopa) para detectar formas tempranas de cncer colorrectal. Hable sobre esto con su mdico si tiene 50 aos, edad a la que comienzan los estudios de rutina. El examen directo del colon debe repetirse cada cinco a 10 aos, hasta los 75 aos, excepto que se encuentren formas tempranas de plipos  precancerosos o pequeos bultos.  Las personas con un riesgo mayor de padecer hepatitis B deben realizarse anlisis para detectar el virus. Se considera que tiene un alto riesgo de contraer hepatitis B si:  Naci en un pas donde la hepatitis B es frecuente. Pregntele a su mdico qu pases son considerados de alto riesgo.  Sus padres nacieron en un pas de alto riesgo y usted no recibi una vacuna que lo proteja contra la hepatitis B (vacuna contra la hepatitis B).  Tiene VIH o sida.  Usa agujas para inyectarse drogas.  Vive o tiene sexo con alguien que tiene hepatitis B.  Es un hombre que tiene sexo con otros hombres.  Recibe tratamiento de hemodilisis.  Toma ciertos medicamentos para enfermedades como cncer, trasplante de rganos y afecciones autoinmunes.  Se recomienda realizar un anlisis de sangre para detectar hepatitis C a todas las personas nacidas entre 1945 y 1965, y a toda persona que tenga un riesgo de haber contrado esta enfermedad.  Los hombres sanos no deben hacerse anlisis de sangre para detectar antgenos especficos prostticos (PSA) como parte de los estudios de rutina para el cncer. Pregntele a su mdico sobre   las pruebas de Programme researcher, broadcasting/film/video de cncer de prstata.  La evaluacin del cncer de testculos no se recomienda en hombres adolescentes ni adultos que no tengan sntomas. La evaluacin incluye el autoexamen, el examen por parte del mdico y otras pruebas diagnsticas. Consulte con su mdico si tiene algn sntoma o preocupaciones acerca del cncer de testculos.  Practique el sexo seguro. Use condones y evite las prcticas sexuales riesgosas para disminuir el contagio de enfermedades de transmisin sexual (ETS).  Debe realizarse pruebas de deteccin de ETS, incluidas la gonorrea y la clamidia si:  Es sexualmente activa y es menor de [place].  Es mayor de 57aos y Investment banker, operational dice que est en riesgo de padecer esta infeccin.  La actividad sexual ha  cambiado desde que le hicieron la ltima prueba de deteccin y tiene un riesgo mayor de Best boy clamidia o Radio broadcast assistant. Pregntele al mdico si usted tiene riesgo.  Si tiene riesgo de infectarse por el VIH, se recomienda tomar diariamente un medicamento recetado para evitar la infeccin. Esto se conoce como profilaxis previa a la exposicin. Se considera que est en riesgo si:  Es un hombre que tiene sexo con otros hombres.  Es heterosexual y es activo sexualmente con mltiples parejas.  Se inyecta drogas.  Es sexualmente activo con una pareja que tiene VIH.  Consulte a su mdico para saber si tiene un alto riesgo de infectarse por el VIH. Si opta por comenzar la profilaxis previa a la exposicin, primero debe realizarse anlisis de deteccin del VIH. Luego, le harn anlisis cada 16mses mientras est tomando los medicamentos para la profilaxis previa a la exposicin.  Utilice pantalla solar. Aplique pantalla solar de mKerry Doryy repetida a lo largo del dTraining and development officer Resgurdese del sol cuando la sombra sea ms pequea que usted. Protjase usando mangas y pThe [employer] un sombrero de ala ancha y gafas para el sol todo el ao, siempre que se encuentre en el exterior.  Informe a su mdico si aparecen nuevos lunares o los que tiene se modifican, especialmente en forma y color. Tambin notifique al mdico si un lunar es ms grande que el tamao de una goma de lGames developer  Si tiene entre 660y 779aos y es o ha sido fumador, se recomienda un estudio con ecografa para dEnvironmental managerde aorta abdominal (AAA) y su eventual reparacin qUnited [place]  [place](inmunizaciones).   Esta informacin no tiene cMarine scientistel consejo del mdico. Asegrese de hacerle al mdico cualquier pregunta que tenga.   Document Released: 01/01/2008 Document Revised: 07/26/2014 Elsevier Interactive Patient Education 2[employer]

## 2015-12-05 NOTE — Progress Notes (Signed)
\  Subjective:    Patient ID: Craig Fisher, male    DOB: 1960/11/16, 55 y.o.   MRN: [id number]  Pt presents to the office today for chronic follow up. PT current on dialysis for end stage renal disease every Tuesday, Thursday, and Saturday Diabetes He presents for his follow-up diabetic visit. He has type 2 diabetes mellitus. His disease course has been fluctuating. Pertinent negatives for hypoglycemia include no confusion or dizziness. Pertinent negatives for diabetes include no chest pain, no foot paresthesias, no foot ulcerations and no visual change. Symptoms are worsening. Diabetic complications include nephropathy. Pertinent negatives for diabetic complications include no CVA, heart disease or peripheral neuropathy. Risk factors for coronary artery disease include diabetes mellitus, dyslipidemia, hypertension and male sex. Current diabetic treatment includes insulin injections. He is compliant with treatment none of the time. His breakfast blood glucose range is generally 110-130 mg/dl. ( ) An ACE inhibitor/angiotensin II receptor blocker is contraindicated. Eye exam is current.  Hypertension This is a chronic problem. The current episode started more than 1 year ago. The problem has been resolved since onset. The problem is controlled. Pertinent negatives include no anxiety, chest pain, palpitations, peripheral edema or shortness of breath. Risk factors for coronary artery disease include dyslipidemia and male gender. Past treatments include diuretics and calcium channel blockers. The current treatment provides mild improvement. Hypertensive end-organ damage includes kidney disease. There is no history of CAD/MI, CVA, heart failure or a thyroid problem. There is no history of sleep apnea.  Hyperlipidemia This is a chronic problem. The current episode started more than 1 year ago. The problem is uncontrolled. Recent lipid tests were reviewed and are high. Exacerbating diseases include diabetes.  Pertinent negatives include no chest pain, leg pain or shortness of breath. Current antihyperlipidemic treatment includes diet change and statins. The current treatment provides moderate improvement of lipids. Risk factors for coronary artery disease include diabetes mellitus, dyslipidemia, male sex and hypertension.      Review of Systems  Constitutional: Negative.   HENT: Negative.   Respiratory: Negative.  Negative for shortness of breath.   Cardiovascular: Negative.  Negative for chest pain and palpitations.  Endocrine: Negative.   Genitourinary: Negative.   Musculoskeletal: Negative.   Neurological: Negative.  Negative for dizziness.  Hematological: Negative.   Psychiatric/Behavioral: Negative.  Negative for confusion.  All other systems reviewed and are negative.      Objective:   Physical Exam  Constitutional: He is oriented to person, place, and time. He appears well-developed and well-nourished. No distress.  HENT:  Head: Normocephalic.  Right Ear: External ear normal.  Left Ear: External ear normal.  Nose: Nose normal.  Mouth/Throat: Oropharynx is clear and moist.  Eyes: Pupils are equal, round, and reactive to light. Right eye exhibits no discharge. Left eye exhibits no discharge.  Neck: Normal range of motion. Neck supple. No thyromegaly present.  Cardiovascular: Normal rate, regular rhythm, normal heart sounds and intact distal pulses.   No murmur heard. Pulmonary/Chest: Effort normal and breath sounds normal. No respiratory distress. He has no wheezes.  Abdominal: Soft. Bowel sounds are normal. He exhibits no distension. There is no tenderness.  Musculoskeletal: Normal range of motion. He exhibits no edema or tenderness.  Neurological: He is alert and oriented to person, place, and time. He has normal reflexes. No cranial nerve deficit.  Skin: Skin is warm and dry. No rash noted. No erythema.  Psychiatric: He has a normal mood and affect. His behavior is normal.  Judgment and  thought content normal.  Vitals reviewed.     BP 123/68 mmHg  Pulse 75  Temp(Src) 98.1 F (36.7 C) (Oral)  Ht 5' 5"  (1.651 m)  Wt 130 lb 3.2 oz (59.058 kg)  BMI 21.67 kg/m2     Assessment & Plan:  1. Essential hypertension - CMP14+EGFR  2. Uncontrolled type 2 diabetes mellitus with complication, with long-term current use of insulin (HCC) - Bayer DCA Hb A1c Waived - CMP14+EGFR - Microalbumin / creatinine urine ratio  3. Chronic kidney disease (CKD) stage G4/A1, severely decreased glomerular filtration rate (GFR) between 15-29 mL/min/1.73 square meter and albuminuria creatinine ratio less than 30 mg/g (HCC) - CMP14+EGFR  4. Chronic kidney disease, stage V (HCC) - CMP14+EGFR  5. HLD (hyperlipidemia) - CMP14+EGFR - Lipid panel  6. Diabetic retinopathy associated with type 2 diabetes mellitus, macular edema presence unspecified, unspecified retinopathy severity (HCC) - CMP14+EGFR  7. ESRD on dialysis [facility]) - CMP14+EGFR   Continue all meds Labs pending Health Maintenance reviewed Diet and exercise encouraged RTO 3 months  Evelina Dun, FNP

## 2015-12-06 LAB — LIPID PANEL
Chol/HDL Ratio: 3.7 ratio (ref 0.0–5.0)
Cholesterol, Total: 183 mg/dL (ref 100–199)
HDL: 50 mg/dL
LDL Calculated: 89 mg/dL (ref 0–99)
Triglycerides: 219 mg/dL — ABNORMAL HIGH (ref 0–149)
VLDL Cholesterol Cal: 44 mg/dL — ABNORMAL HIGH (ref 5–40)

## 2015-12-06 LAB — CMP14+EGFR
A/G RATIO: 1.7 (ref 1.2–2.2)
ALT: 17 IU/L (ref 0–44)
AST: 19 IU/L (ref 0–40)
Albumin: 3.9 g/dL (ref 3.5–5.5)
Alkaline Phosphatase: 79 IU/L (ref 39–117)
BILIRUBIN TOTAL: 0.2 mg/dL (ref 0.0–1.2)
BUN/Creatinine Ratio: 6 — ABNORMAL LOW (ref 9–20)
BUN: 37 mg/dL — AB (ref 6–24)
CALCIUM: 8.1 mg/dL — AB (ref 8.7–10.2)
CHLORIDE: 97 mmol/L (ref 96–106)
CO2: 25 mmol/L (ref 18–29)
Creatinine, Ser: 6.07 mg/dL (ref 0.76–1.27)
GFR calc Af Amer: 11 mL/min/{1.73_m2} — ABNORMAL LOW (ref >59)
GFR, EST NON AFRICAN AMERICAN: 10 mL/min/{1.73_m2} — AB (ref >59)
GLUCOSE: 132 mg/dL — AB (ref 65–99)
Globulin, Total: 2.3 g/dL (ref 1.5–4.5)
POTASSIUM: 5.3 mmol/L — AB (ref 3.5–5.2)
Sodium: 138 mmol/L (ref 134–144)
Total Protein: 6.2 g/dL (ref 6.0–8.5)

## 2015-12-06 LAB — MICROALBUMIN / CREATININE URINE RATIO
Creatinine, Urine: 124.4 mg/dL
MICROALB/CREAT RATIO: 3523.6 mg/g creat — ABNORMAL HIGH (ref 0.0–30.0)
Microalbumin, Urine: 4383.3 ug/mL

## 2015-12-08 ENCOUNTER — Telehealth: Payer: Self-pay | Admitting: Family

## 2015-12-08 NOTE — Telephone Encounter (Signed)
Patient aware of results.

## 2015-12-16 ENCOUNTER — Encounter: Payer: Self-pay | Admitting: Vascular Surgery

## 2015-12-17 ENCOUNTER — Ambulatory Visit (HOSPITAL_COMMUNITY)
Admission: RE | Admit: 2015-12-17 | Discharge: 2015-12-17 | Disposition: A | Discharge status: Home / Self Care | Payer: Managed Care, Other (non HMO) | Source: Ambulatory Visit | Attending: Vascular Surgery | Admitting: Vascular Surgery

## 2015-12-17 ENCOUNTER — Encounter: Payer: Self-pay | Admitting: Vascular Surgery

## 2015-12-17 DIAGNOSIS — Z4931 Encounter for adequacy testing for hemodialysis: Secondary | ICD-10-CM | POA: Insufficient documentation

## 2015-12-17 DIAGNOSIS — E1122 Type 2 diabetes mellitus with diabetic chronic kidney disease: Secondary | ICD-10-CM | POA: Insufficient documentation

## 2015-12-17 DIAGNOSIS — I120 Hypertensive chronic kidney disease with stage 5 chronic kidney disease or end stage renal disease: Secondary | ICD-10-CM | POA: Insufficient documentation

## 2015-12-17 DIAGNOSIS — E785 Hyperlipidemia, unspecified: Secondary | ICD-10-CM | POA: Insufficient documentation

## 2015-12-17 DIAGNOSIS — N186 End stage renal disease: Secondary | ICD-10-CM | POA: Diagnosis not present

## 2015-12-17 DIAGNOSIS — Z9889 Other specified postprocedural states: Secondary | ICD-10-CM | POA: Diagnosis present

## 2015-12-19 ENCOUNTER — Encounter: Payer: Self-pay | Admitting: Vascular Surgery

## 2015-12-22 ENCOUNTER — Encounter: Payer: Managed Care, Other (non HMO) | Admitting: Vascular Surgery

## 2015-12-29 ENCOUNTER — Encounter: Payer: Self-pay | Admitting: Vascular Surgery

## 2015-12-29 ENCOUNTER — Ambulatory Visit (INDEPENDENT_AMBULATORY_CARE_PROVIDER_SITE_OTHER): Payer: Self-pay | Admitting: Vascular Surgery

## 2015-12-29 VITALS — BP 159/67 | HR 74 | Temp 98.2°F | Resp 18 | Ht 65.0 in | Wt 132.0 lb

## 2015-12-29 DIAGNOSIS — Z992 Dependence on renal dialysis: Secondary | ICD-10-CM

## 2015-12-29 DIAGNOSIS — N186 End stage renal disease: Secondary | ICD-10-CM

## 2015-12-29 NOTE — Progress Notes (Signed)
Filed Vitals:   12/29/15 1457 12/29/15 1459  BP: 173/74 159/67  Pulse: 75 74  Temp: 98.2 F (36.8 C)   Resp: 18   Height: 5\' 5"  (1.651 m)   Weight: 132 lb (59.875 kg)   SpO2: 98%

## 2015-12-29 NOTE — Progress Notes (Signed)
Subjective:     Patient ID: Craig Fisher, male   DOB: [birth year], 55 y.o.   MRN: [id number]  HPI this 55 year old male returns 8 weeks post creation left brachial cephalic AV fistula for end-stage renal disease. Patient is being dialyzed currently through catheter in the IJ-right. He denies pain in the left hand but does have some mild numbness at the end of dialysis sessions which easily resolves following dialysis.    Review of Systems     Objective:   Physical Exam BP 159/67 mmHg  Pulse 74  Temp(Src) 98.2 F (36.8 C)  Resp 18  Ht 5\' 5"  (1.651 m)  Wt 132 lb (59.875 kg)  BMI 21.97 kg/m2  SpO2 98%  Gen. well-developed well-nourished male no apparent distress alert and oriented 3 Lungs no rhonchi or wheezing-IJ catheter and right upper chest Left upper extremity with excellent brachial-cephalic AV fistula easily visible through scan. Left hand is well perfused pink with 1-2+ radial pulse palpable which does improve with compression of fistula  Ultrasound was performed on 12/17/2015 which reveals the fistula to be widely patent with no significant side branches     Assessment:     End-stage renal disease currently on dialysis through right IJ catheter Nicely functioning left brachial-cephalic AV fistula which could be used 01/28/2016 Patient is interested in peritoneal dialysis and will talk to kidney doctors    Plan:     Return to see me on when necessary basis After patient is on dialysis successfully either peritoneal or heme oh through the fistula please notify us and we will be happy to remove the catheter-right IJ

## 2016-02-14 IMAGING — US US RENAL
1 series · 14 of 25 positions shown · non-contrast
Comparison: None.

CLINICAL DATA: Patient with chronic kidney disease.

EXAM:
RENAL / URINARY TRACT ULTRASOUND COMPLETE

[Series 1: us renal · 0.16mm/px · 14 of 59 slices shown]
[im 1/59]
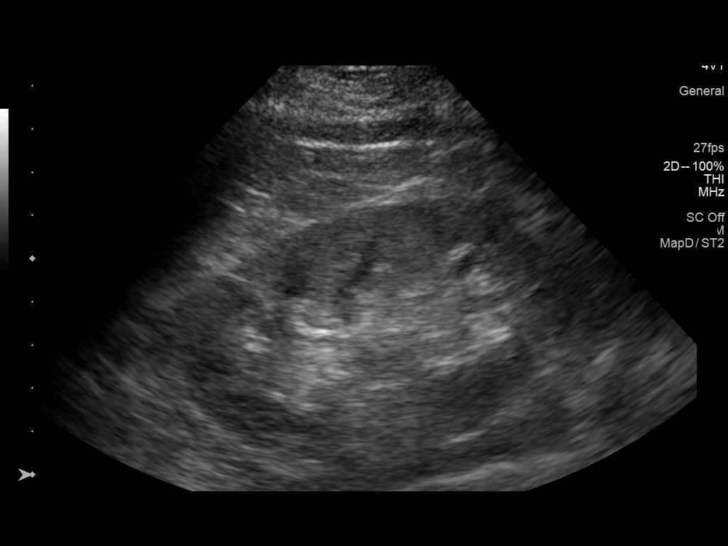
[im 5/59]
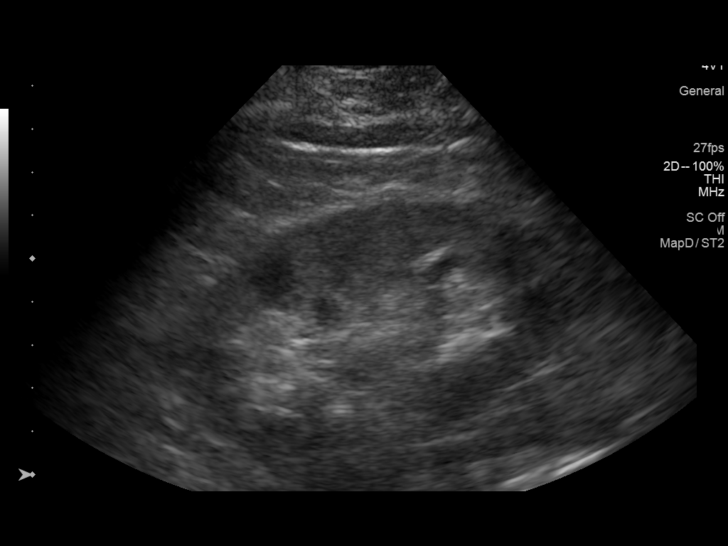
[im 10/59]
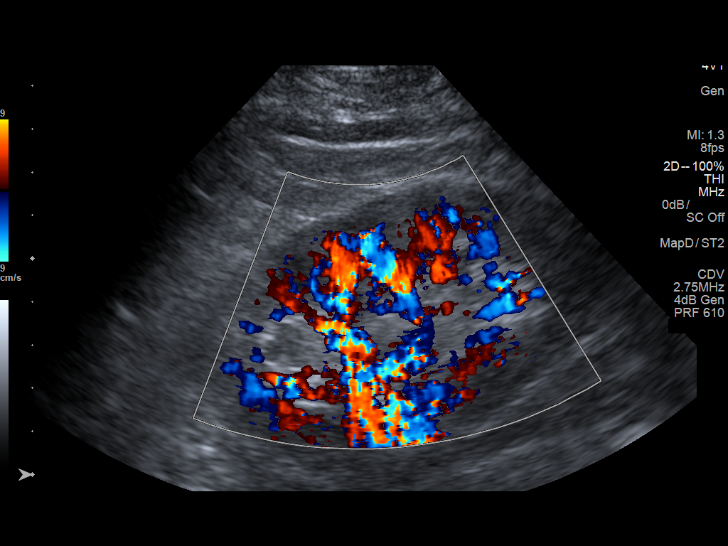
[im 15/59]
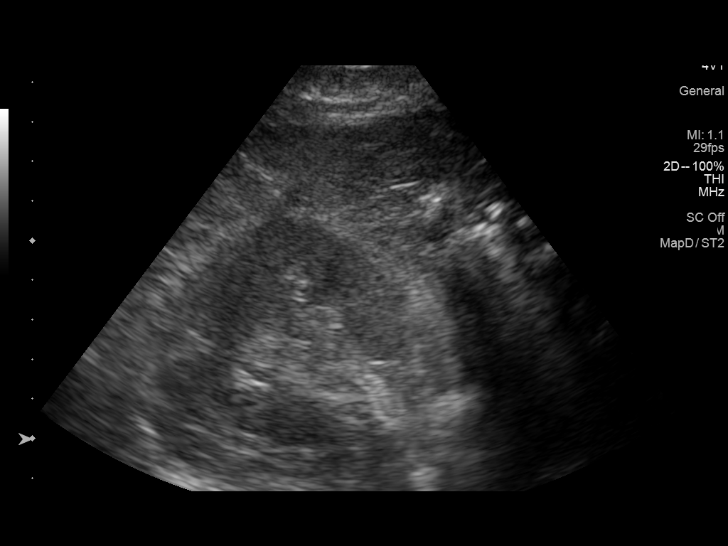
[im 20/59]
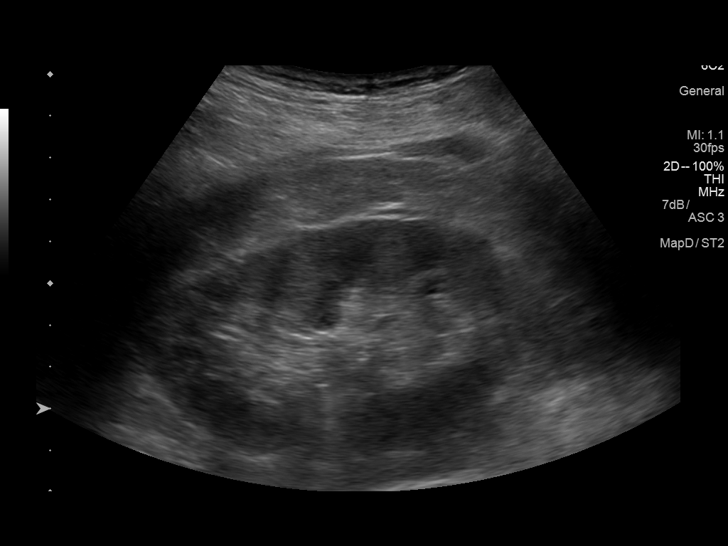
[im 22/59]
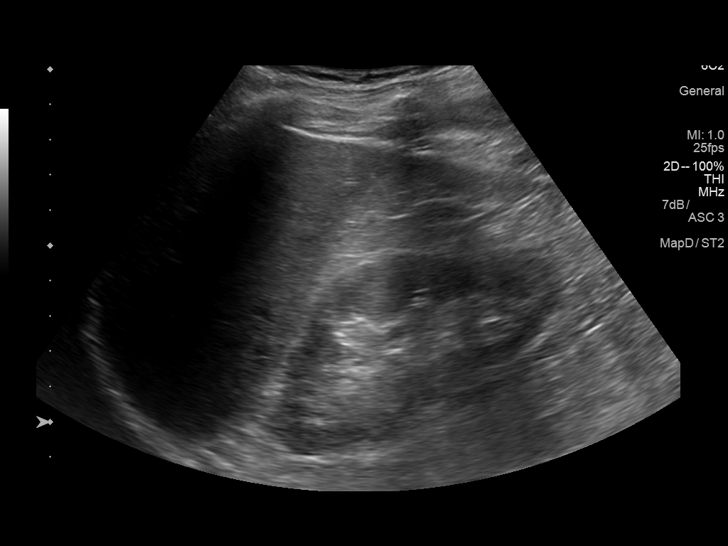
[im 27/59]
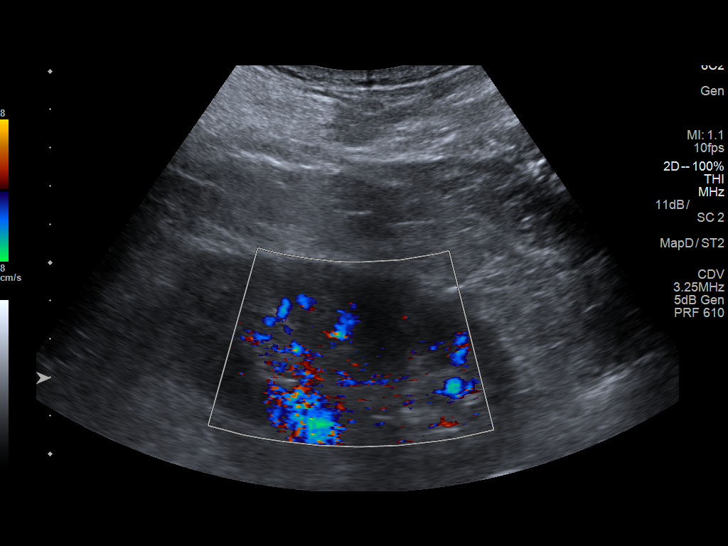
[im 32/59]
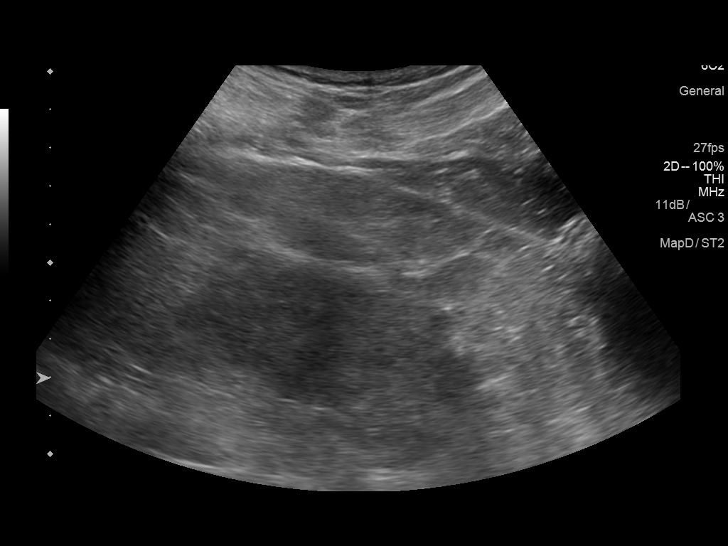
[im 37/59]
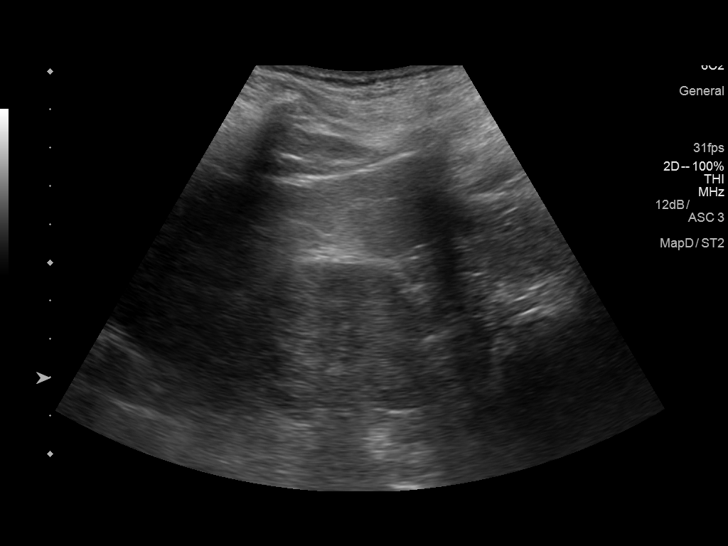
[im 39/59]
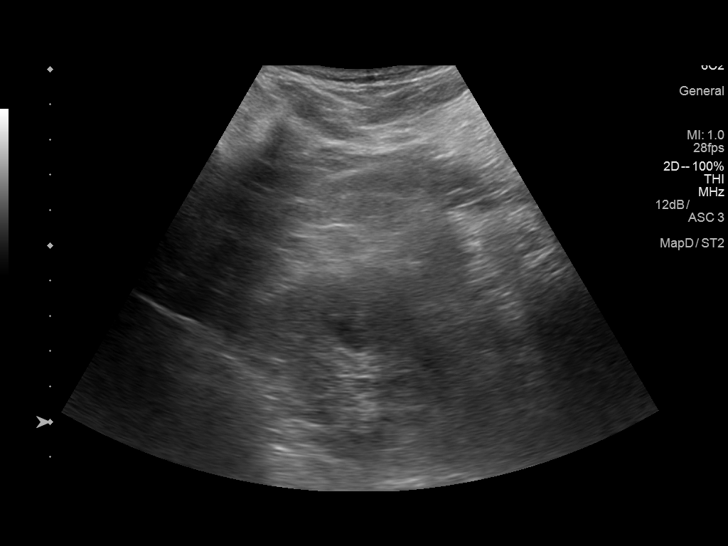
[im 44/59]
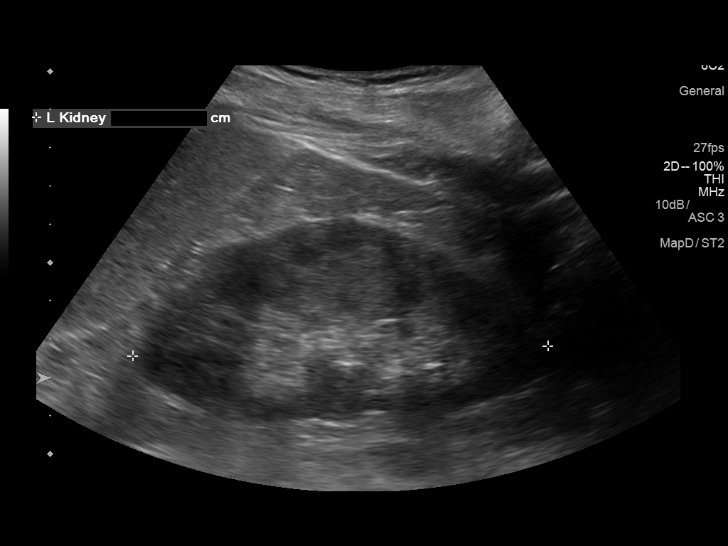
[im 49/59]
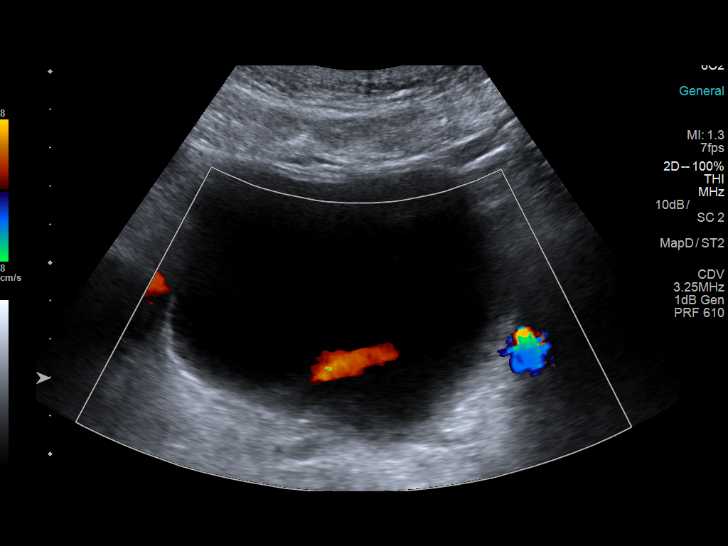
[im 54/59]
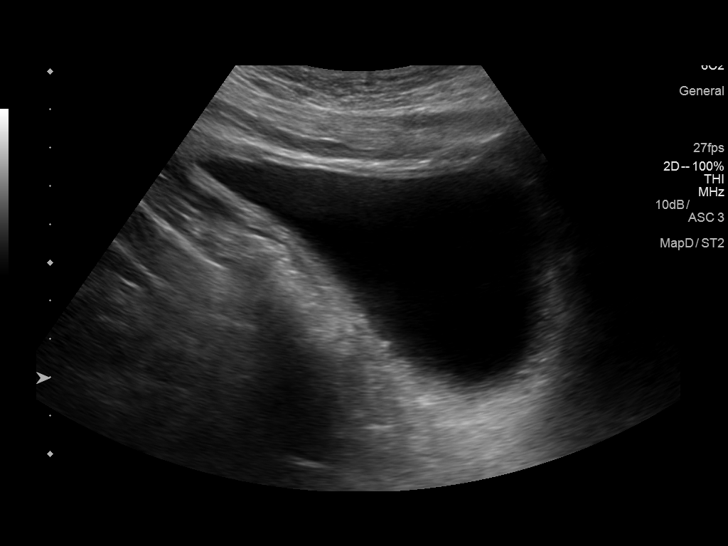
[im 59/59]
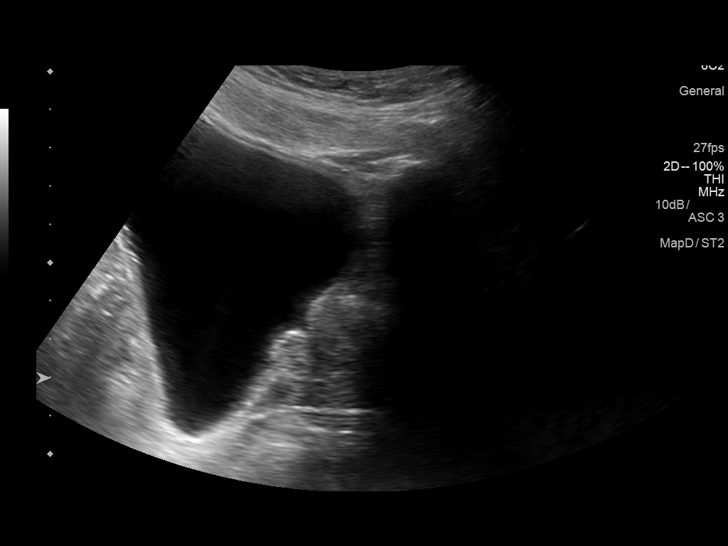

[14 of 25 positions shown; findings below may reference images not displayed]

FINDINGS: Right Kidney:

Length: 10.9 cm. No hydronephrosis. Increased renal cortical
echogenicity.

Left Kidney:

Length: 10.9 cm. No hydronephrosis. Increased renal cortical
echogenicity.

Bladder:

Appears normal for degree of bladder distention.
IMPRESSION: No hydronephrosis.

Increased renal cortical echogenicity bilaterally as can be seen
with chronic medical renal disease.

## 2016-02-17 ENCOUNTER — Other Ambulatory Visit (HOSPITAL_COMMUNITY): Payer: Self-pay | Admitting: Nephrology

## 2016-02-17 DIAGNOSIS — N186 End stage renal disease: Secondary | ICD-10-CM

## 2016-02-19 ENCOUNTER — Other Ambulatory Visit: Payer: Self-pay | Admitting: Physician Assistant

## 2016-02-20 ENCOUNTER — Ambulatory Visit (HOSPITAL_COMMUNITY)
Admission: RE | Admit: 2016-02-20 | Discharge: 2016-02-20 | Disposition: A | Discharge status: Home / Self Care | Payer: Managed Care, Other (non HMO) | Source: Ambulatory Visit | Attending: Nephrology | Admitting: Nephrology

## 2016-02-20 ENCOUNTER — Encounter (HOSPITAL_COMMUNITY): Payer: Self-pay | Admitting: Interventional Radiology

## 2016-02-20 DIAGNOSIS — Z452 Encounter for adjustment and management of vascular access device: Secondary | ICD-10-CM | POA: Insufficient documentation

## 2016-02-20 DIAGNOSIS — N186 End stage renal disease: Secondary | ICD-10-CM

## 2016-02-20 DIAGNOSIS — N19 Unspecified kidney failure: Secondary | ICD-10-CM | POA: Insufficient documentation

## 2016-02-20 HISTORY — PX: IR GENERIC HISTORICAL: IMG1180011

## 2016-02-20 MED ORDER — CHLORHEXIDINE GLUCONATE 4 % EX LIQD
CUTANEOUS | Status: AC
  Administered 2016-02-20: 12:00:00 via TOPICAL
  Filled 2016-02-20: qty 15

## 2016-02-20 MED ORDER — LIDOCAINE HCL 1 % IJ SOLN
INTRAMUSCULAR | Status: AC
  Administered 2016-02-20: 5 mL
  Filled 2016-02-20: qty 20

## 2016-02-20 NOTE — Procedures (Signed)
Interventional Radiology Procedure Note  Procedure: Removal of HD catheter, right IJ.  Local anesthesia.  Recommendations:  - Ok to shower tomorrow - Do not submerge for 7 days - Routine wound care   Signed,  Dulcy Fanny. Earleen Newport, DO

## 2016-02-23 ENCOUNTER — Encounter: Payer: Self-pay | Admitting: *Deleted

## 2016-03-12 ENCOUNTER — Ambulatory Visit: Payer: Managed Care, Other (non HMO) | Admitting: Family

## 2016-03-15 ENCOUNTER — Encounter: Payer: Self-pay | Admitting: Family

## 2016-03-15 ENCOUNTER — Ambulatory Visit (INDEPENDENT_AMBULATORY_CARE_PROVIDER_SITE_OTHER): Payer: Managed Care, Other (non HMO) | Admitting: Family

## 2016-03-15 VITALS — BP 184/85 | HR 72

## 2016-03-15 DIAGNOSIS — IMO0002 Reserved for concepts with insufficient information to code with codable children: Secondary | ICD-10-CM

## 2016-03-15 DIAGNOSIS — Z794 Long term (current) use of insulin: Secondary | ICD-10-CM

## 2016-03-15 DIAGNOSIS — N185 Chronic kidney disease, stage 5: Secondary | ICD-10-CM | POA: Diagnosis not present

## 2016-03-15 DIAGNOSIS — I10 Essential (primary) hypertension: Secondary | ICD-10-CM | POA: Diagnosis not present

## 2016-03-15 DIAGNOSIS — E785 Hyperlipidemia, unspecified: Secondary | ICD-10-CM | POA: Diagnosis not present

## 2016-03-15 DIAGNOSIS — N186 End stage renal disease: Secondary | ICD-10-CM | POA: Diagnosis not present

## 2016-03-15 DIAGNOSIS — E11319 Type 2 diabetes mellitus with unspecified diabetic retinopathy without macular edema: Secondary | ICD-10-CM

## 2016-03-15 DIAGNOSIS — N184 Chronic kidney disease, stage 4 (severe): Secondary | ICD-10-CM

## 2016-03-15 DIAGNOSIS — E1165 Type 2 diabetes mellitus with hyperglycemia: Secondary | ICD-10-CM

## 2016-03-15 DIAGNOSIS — Z992 Dependence on renal dialysis: Secondary | ICD-10-CM

## 2016-03-15 DIAGNOSIS — E118 Type 2 diabetes mellitus with unspecified complications: Secondary | ICD-10-CM | POA: Diagnosis not present

## 2016-03-15 LAB — BAYER DCA HB A1C WAIVED: HB A1C (BAYER DCA - WAIVED): 7.1 % — ABNORMAL HIGH (ref <7.0)

## 2016-03-15 MED ORDER — INSULIN GLARGINE 100 UNIT/ML SOLOSTAR PEN: 20.0000 [IU] | PEN_INJECTOR | Freq: Every day | SUBCUTANEOUS | 11 refills | Status: DC

## 2016-03-15 MED ORDER — INSULIN PEN NEEDLE 31G X 4 MM MISC: 1.0000 "application " | Freq: Three times a day (TID) | 11 refills | Status: DC

## 2016-03-15 NOTE — Progress Notes (Signed)
\  Subjective:    Patient ID: Craig Fisher, male    DOB: 25-Jan-1961, 55 y.o.   MRN: [id number]  Pt presents to the office today for chronic follow up. PT current on dialysis for end stage renal disease every Tuesday, Thursday, and Saturday Diabetes  He presents for his follow-up diabetic visit. He has type 2 diabetes mellitus. His disease course has been fluctuating. Pertinent negatives for hypoglycemia include no confusion or dizziness. Associated symptoms include visual change. Pertinent negatives for diabetes include no chest pain, no foot paresthesias and no foot ulcerations. Symptoms are worsening. Diabetic complications include nephropathy. Pertinent negatives for diabetic complications include no CVA, heart disease or peripheral neuropathy. Risk factors for coronary artery disease include diabetes mellitus, dyslipidemia, hypertension and male sex. Current diabetic treatment includes insulin injections. He is compliant with treatment none of the time. His breakfast blood glucose range is generally 110-130 mg/dl. ( ) An ACE inhibitor/angiotensin II receptor blocker is contraindicated. Eye exam is current.  Hypertension  This is a chronic problem. The current episode started more than 1 year ago. The problem has been waxing and waning since onset. The problem is uncontrolled. Pertinent negatives include no anxiety, chest pain, palpitations, peripheral edema or shortness of breath. Risk factors for coronary artery disease include dyslipidemia and male gender. Past treatments include diuretics and calcium channel blockers. The current treatment provides mild improvement. Hypertensive end-organ damage includes kidney disease. There is no history of CAD/MI, CVA, heart failure or a thyroid problem. There is no history of sleep apnea.  Hyperlipidemia  This is a chronic problem. The current episode started more than 1 year ago. The problem is uncontrolled. Recent lipid tests were reviewed and are high.  Exacerbating diseases include diabetes. Pertinent negatives include no chest pain, leg pain or shortness of breath. Current antihyperlipidemic treatment includes diet change and statins. The current treatment provides moderate improvement of lipids. Risk factors for coronary artery disease include diabetes mellitus, dyslipidemia, male sex and hypertension.      Review of Systems  Constitutional: Negative.   HENT: Negative.   Respiratory: Negative.  Negative for shortness of breath.   Cardiovascular: Negative.  Negative for chest pain and palpitations.  Endocrine: Negative.   Genitourinary: Negative.   Musculoskeletal: Negative.   Neurological: Negative.  Negative for dizziness.  Hematological: Negative.   Psychiatric/Behavioral: Negative.  Negative for confusion.  All other systems reviewed and are negative.      Objective:   Physical Exam  Constitutional: He is oriented to person, place, and time. He appears well-developed and well-nourished. No distress.  HENT:  Head: Normocephalic.  Right Ear: External ear normal.  Left Ear: External ear normal.  Nose: Nose normal.  Mouth/Throat: Oropharynx is clear and moist.  Eyes: Pupils are equal, round, and reactive to light. Right eye exhibits no discharge. Left eye exhibits no discharge.  Neck: Normal range of motion. Neck supple. No thyromegaly present.  Cardiovascular: Normal rate, regular rhythm, normal heart sounds and intact distal pulses.   No murmur heard. Pulmonary/Chest: Effort normal and breath sounds normal. No respiratory distress. He has no wheezes.  Abdominal: Soft. Bowel sounds are normal. He exhibits no distension. There is no tenderness.  Musculoskeletal: Normal range of motion. He exhibits no edema or tenderness.  Neurological: He is alert and oriented to person, place, and time. He has normal reflexes. No cranial nerve deficit.  Skin: Skin is warm and dry. No rash noted. No erythema.  Psychiatric: He has a normal  mood and  affect. His behavior is normal. Judgment and thought content normal.  Vitals reviewed.     BP (!) 184/85   Pulse 72      Assessment & Plan:  1. Essential hypertension - CMP14+EGFR  2. Uncontrolled type 2 diabetes mellitus with complication, with long-term current use of insulin (HCC) - Bayer DCA Hb A1c Waived - CMP14+EGFR  3. Chronic kidney disease (CKD) stage G4/A1, severely decreased glomerular filtration rate (GFR) between 15-29 mL/min/1.73 square meter and albuminuria creatinine ratio less than 30 mg/g (HCC) - CMP14+EGFR  4. Chronic kidney disease, stage V (HCC) - CMP14+EGFR  5. ESRD on dialysis ([place]) - CMP14+EGFR  6. Diabetic retinopathy associated with type 2 diabetes mellitus, macular edema presence unspecified, unspecified retinopathy severity (HCC) - CMP14+EGFR  7. HLD (hyperlipidemia) - CMP14+EGFR - Lipid panel   Continue all meds Labs pending Health Maintenance reviewed Diet and exercise encouraged RTO 3 months  Evelina Dun, FNP

## 2016-03-15 NOTE — Patient Instructions (Signed)
La diabetes mellitus y los alimentos (Diabetes Mellitus and Food) Es importante que controle su nivel de azcar en la sangre (glucosa). El nivel de glucosa en sangre depende en gran medida de lo que usted come. Comer alimentos saludables en las cantidades [place] a lo largo del Training and development officer, aproximadamente a la misma hora [employer], lo ayudar a Chief Technology Officer su nivel de Multimedia programmer. Tambin puede ayudarlo a retrasar o Patent attorney de la diabetes mellitus. Comer de [employer] saludable incluso puede ayudarlo a Chartered loss adjuster de presin arterial y a Science writer o Theatre manager un peso saludable.  Entre las recomendaciones generales para alimentarse y Audiological scientist los alimentos de forma saludable, se incluyen las siguientes:  Respetar las comidas principales y comer colaciones con regularidad. Evitar pasar largos perodos sin comer con el fin de perder peso.  Seguir una dieta que consista principalmente en alimentos de origen vegetal, como frutas, vegetales, frutos secos, legumbres y cereales integrales.  Utilizar mtodos de coccin a baja temperatura, como hornear, en lugar de mtodos de coccin a alta temperatura, como frer en abundante aceite. Trabaje con el nutricionista para aprender a Financial planner nutricional de las etiquetas de los alimentos. CMO PUEDEN AFECTARME LOS ALIMENTOS? Carbohidratos Los carbohidratos afectan el nivel de glucosa en sangre ms que cualquier otro tipo de alimento. El nutricionista lo ayudar a Teacher, adult education cuntos carbohidratos puede consumir en cada comida y ensearle a contarlos. El recuento de carbohidratos es importante para mantener la glucosa en sangre en un nivel saludable, en especial si utiliza insulina o toma determinados medicamentos para la diabetes mellitus. Alcohol El alcohol puede provocar disminuciones sbitas de la glucosa en sangre (hipoglucemia), en especial si utiliza insulina o toma determinados medicamentos para la diabetes mellitus. La  hipoglucemia es una afeccin que puede poner en peligro la vida. Los sntomas de la hipoglucemia (somnolencia, mareos y Data processing manager) son similares a los sntomas de haber consumido mucho alcohol.  Si el mdico lo autoriza a beber alcohol, hgalo con moderacin y siga estas pautas:  Las mujeres no deben beber ms de un trago por da, y los hombres no deben beber ms de dos tragos por Training and development officer. Un trago es igual a:  12 onzas (355 ml) de cerveza  5 onzas de vino (150 ml) de vino  1,5onzas (23m) de bebidas espirituosas  No beba con el estmago vaco.  Mantngase hidratado. Beba agua, gaseosas dietticas o t helado sin azcar.  Las gaseosas comunes, los jugos y otros refrescos podran contener muchos carbohidratos y se dCivil Service fast streamer QU ALIMENTOS NO SE RECOMIENDAN? Cuando haga las elecciones de alimentos, es importante que recuerde que todos los alimentos son distintos. Algunos tienen menos nutrientes que otros por porcin, aunque podran tener la misma cantidad de caloras o carbohidratos. Es difcil darle al cuerpo lo que necesita cuando consume alimentos con menos nutrientes. Estos son algunos ejemplos de alimentos que debera evitar ya que contienen muchas caloras y carbohidratos, pero pocos nutrientes:  GPhysicist, medicaltrans (la mayora de los alimentos procesados incluyen grasas trans en la etiqueta de Informacin nutricional).  Gaseosas comunes.  Jugos.  Caramelos.  Dulces, como tortas, pasteles, rosquillas y [place]  Comidas fritas. QU ALIMENTOS PUEDO COMER? Consuma alimentos ricos en nutrientes, que nutrirn el cuerpo y lo mantendrn saludable. Los alimentos que debe comer tambin dependern de varios factores, como:  Las caloras que necesita.  Los medicamentos que toma.  Su peso.  El nivel de glucosa en s[employer]ist College  El nArrow Rockde presin arterial.  El nivel de colesterol.  Debe consumir una amplia variedad de alimentos, por ejemplo:  Protenas.  Cortes de [employer].  Protenas con bajo contenido de grasas saturadas, como pescado, clara de huevo y frijoles. Evite las carnes procesadas.  Frutas y vegetales.  Frutas y Photographer que pueden ayudar a Chief Technology Officer los niveles sanguneos de [place], como [place], mangos y batatas.  Productos lcteos.  Elija productos lcteos sin grasa o con bajo contenido de [place], como [place], yogur y [place].  Cereales, panes, pastas y arroz.  Elija cereales integrales, como panes multicereales, avena en grano y arroz integral. Estos alimentos pueden ayudar a controlar la presin arterial.  Daphene Jaeger.  Alimentos que contengan grasas saludables, como frutos secos, Musician, aceite de [place], aceite de canola y pescado. TODOS LOS QUE PADECEN DIABETES MELLITUS TIENEN EL [place] PLAN DE [place]? Dado que todas las personas que padecen diabetes mellitus son distintas, no hay un solo plan de comidas que funcione para todos. Es muy importante que se rena con un nutricionista que lo ayudar a crear un plan de comidas adecuado para usted.   Esta informacin no tiene Marine scientist el consejo del mdico. Asegrese de hacerle al mdico cualquier pregunta que tenga.   Document Released: 10/12/2007 Document Revised: 07/26/2014 Elsevier Interactive Patient Education [employer].

## 2016-03-15 NOTE — Addendum Note (Signed)
Addended by: Evelina Dun A on: 03/15/2016 04:52 PM   Modules accepted: Orders

## 2016-03-16 LAB — CMP14+EGFR
A/G RATIO: 1.7 (ref 1.2–2.2)
ALK PHOS: 70 IU/L (ref 39–117)
ALT: 15 IU/L (ref 0–44)
AST: 17 IU/L (ref 0–40)
Albumin: 4 g/dL (ref 3.5–5.5)
BUN/Creatinine Ratio: 8 — ABNORMAL LOW (ref 9–20)
BUN: 61 mg/dL — ABNORMAL HIGH (ref 6–24)
Bilirubin Total: 0.3 mg/dL (ref 0.0–1.2)
CO2: 23 mmol/L (ref 18–29)
Calcium: 8.3 mg/dL — ABNORMAL LOW (ref 8.7–10.2)
Chloride: 96 mmol/L (ref 96–106)
Creatinine, Ser: 7.85 mg/dL (ref 0.76–1.27)
GFR calc Af Amer: 8 mL/min/{1.73_m2} — ABNORMAL LOW (ref >59)
GFR, EST NON AFRICAN AMERICAN: 7 mL/min/{1.73_m2} — AB (ref >59)
GLOBULIN, TOTAL: 2.4 g/dL (ref 1.5–4.5)
Glucose: 138 mg/dL — ABNORMAL HIGH (ref 65–99)
POTASSIUM: 5 mmol/L (ref 3.5–5.2)
SODIUM: 139 mmol/L (ref 134–144)
Total Protein: 6.4 g/dL (ref 6.0–8.5)

## 2016-03-16 LAB — LIPID PANEL
CHOL/HDL RATIO: 4.8 ratio (ref 0.0–5.0)
CHOLESTEROL TOTAL: 186 mg/dL (ref 100–199)
HDL: 39 mg/dL — ABNORMAL LOW (ref >39)
LDL Calculated: 88 mg/dL (ref 0–99)
TRIGLYCERIDES: 293 mg/dL — AB (ref 0–149)
VLDL Cholesterol Cal: 59 mg/dL — ABNORMAL HIGH (ref 5–40)

## 2016-03-19 ENCOUNTER — Telehealth: Payer: Self-pay | Admitting: Family

## 2016-03-19 NOTE — Telephone Encounter (Signed)
Called [facility] and clarified rx for insulin needles. No further action needed so will close encounter.

## 2016-04-15 ENCOUNTER — Encounter: Payer: Self-pay | Admitting: Surgery

## 2016-04-15 ENCOUNTER — Ambulatory Visit: Payer: Self-pay | Admitting: Surgery

## 2016-04-15 NOTE — H&P (Signed)
Craig Fisher 04/15/2016 4:28 PM Location: [facility] Patient #: [id number] DOB: [birth year] Married / Language: Spanish / Race: White Male  History of Present Illness Adin Hector MD; 04/15/2016 6:05 PM) The patient is a 55 year old male who presents with a complaint of CAPD catheter placement. Patient sent for surgical consultation by his nephrologist. Dr. Lowanda Foster. Chronic kidney disease on permanent dialysis. Request for peritoneal dialysis catheter placement  Pleasant Hispanic male. Works in a factory doing light duty now. Had some groin pain and discomfort. Concern for inguinal hernia. Recommendation made for surgical consultation. He tried to hold off. Was found to have significant hypertension. Was found to have kidney disease. Unfortunately progressed and and went into end-stage renal disease. Required urgent hemodialysis. Fistula creation by vascular surgeons in April. Gets hemodialysis through his left upper arm AV fistula on Tuesday Thursday Saturday. Gets this up and eaten. That is help stabilize things, but is getting disrupted with work. He was looking for options. The peritoneal dialysis nurses and his nephrologist recommended consideration of peritoneal dialysis. He claims he is seen some instruction video as well as discussed with the dialysis nurses. He is interested in CAPD.  He speaks [place] pretty well but only had an interpreter on the phone just in case. He has a friend who speaks no [place] with him. Patient promises to bring family member that is fluently bilingual. Patient moves his bowels every day. No severe constipation or diarrhea. Can walk a half hour without difficulty. No fevers or chills. He's never had any abdominal surgery. No history of infections.     Other Problems Illene Regulus, CMA; 04/15/2016 4:28 PM) Chronic Renal Failure Syndrome Diabetes Mellitus High blood pressure Hypercholesterolemia Inguinal  Hernia  Past Surgical History Illene Regulus, CMA; 04/15/2016 4:28 PM) Dialysis Shunt / Fistula  Diagnostic Studies History Lars Mage Spillers, CMA; 04/15/2016 4:28 PM) Colonoscopy 1-5 years ago  Allergies Lars Mage Spillers, CMA; 04/15/2016 4:29 PM) OxyCODONE HCl *ANALGESICS - OPIOID*  Medication History (Alisha Spillers, CMA; 04/15/2016 4:31 PM) AmLODIPine Besylate (10MG  Tablet, Oral) Active. BD Pen Needle Nano U/F (32G X 4 MM Misc,) Active. Lidocaine-Prilocaine (2.5-2.5% Cream, External) Active. HydrALAZINE HCl (50MG  Tablet, Oral) Active. Lantus SoloStar (100UNIT/ML Soln Pen-inj, Subcutaneous) Active. Metoprolol Tartrate (50MG  Tablet, Oral) Active. Ondansetron (4MG  Tablet Disint, Oral) Active. Timolol Maleate (0.5% Solution, Ophthalmic) Active. Norvasc (10MG  Tablet, Oral) Active. Baby Aspirin (81MG  Tablet Chewable, Oral) Active. Vitamin D3 (1000UNIT Capsule, Oral) Active. Medications Reconciled  Social History Illene Regulus, CMA; 04/15/2016 4:28 PM) Alcohol use Remotely quit alcohol use. Caffeine use Carbonated beverages, Coffee, Tea. Tobacco use Never smoker.  Family History Illene Regulus, CMA; 04/15/2016 4:28 PM) Diabetes Mellitus Brother, Mother.     Review of Systems Lars Mage Spillers CMA; 04/15/2016 4:28 PM) HEENT Present- Wears glasses/contact lenses. Not Present- Earache, Hearing Loss, Hoarseness, Nose Bleed, Oral Ulcers, Ringing in the Ears, Seasonal Allergies, Sinus Pain, Sore Throat, Visual Disturbances and Yellow Eyes. Cardiovascular Present- Leg Cramps. Not Present- Chest Pain, Difficulty Breathing Lying Down, Palpitations, Rapid Heart Rate, Shortness of Breath and Swelling of Extremities. Gastrointestinal Present- Nausea. Not Present- Abdominal Pain, Bloating, Bloody Stool, Change in Bowel Habits, Chronic diarrhea, Constipation, Difficulty Swallowing, Excessive gas, Gets full quickly at meals, Hemorrhoids, Indigestion, Rectal Pain and  Vomiting. Musculoskeletal Present- Back Pain. Not Present- Joint Pain, Joint Stiffness, Muscle Pain, Muscle Weakness and Swelling of Extremities. Neurological Present- Headaches and Trouble walking. Not Present- Decreased Memory, Fainting, Numbness, Seizures, Tingling, Tremor and Weakness.  Vitals Lars Mage Spillers CMA; 04/15/2016 4:29 PM)  04/15/2016 4:29 PM Weight: 134 lb Height: 65in Body Surface Area: 1.67 m Body Mass Index: 22.3 kg/m  Pulse: 74 (Regular)  BP: 134/82 (Sitting, Left Arm, Standard)      Physical Exam Adin Hector MD; 04/15/2016 5:14 PM)  General Mental Status-Alert. General Appearance-Not in acute distress, Not Sickly. Orientation-Oriented X3. Hydration-Well hydrated. Voice-Normal.  Integumentary Global Assessment Upon inspection and palpation of skin surfaces of the - Axillae: non-tender, no inflammation or ulceration, no drainage. and Distribution of scalp and body hair is normal. General Characteristics Temperature - normal warmth is noted.  Head and Neck Head-normocephalic, atraumatic with no lesions or palpable masses. Face Global Assessment - atraumatic, no absence of expression. Neck Global Assessment - no abnormal movements, no bruit auscultated on the right, no bruit auscultated on the left, no decreased range of motion, non-tender. Trachea-midline. Thyroid Gland Characteristics - non-tender.  Eye Eyeball - Left-Extraocular movements intact, No Nystagmus. Eyeball - Right-Extraocular movements intact, No Nystagmus. Cornea - Left-No Hazy. Cornea - Right-No Hazy. Sclera/Conjunctiva - Left-No scleral icterus, No Discharge. Sclera/Conjunctiva - Right-No scleral icterus, No Discharge. Pupil - Left-Direct reaction to light normal. Pupil - Right-Direct reaction to light normal.  ENMT Ears Pinna - Left - no drainage observed, no generalized tenderness observed. Right - no drainage observed, no generalized  tenderness observed. Nose and Sinuses External Inspection of the Nose - no destructive lesion observed. Inspection of the nares - Left - quiet respiration. Right - quiet respiration. Mouth and Throat Lips - Upper Lip - no fissures observed, no pallor noted. Lower Lip - no fissures observed, no pallor noted. Nasopharynx - no discharge present. Oral Cavity/Oropharynx - Tongue - no dryness observed. Oral Mucosa - no cyanosis observed. Hypopharynx - no evidence of airway distress observed.  Chest and Lung Exam Inspection Movements - Normal and Symmetrical. Accessory muscles - No use of accessory muscles in breathing. Palpation Palpation of the chest reveals - Non-tender. Auscultation Breath sounds - Normal and Clear.  Cardiovascular Auscultation Rhythm - Regular. Murmurs & Other Heart Sounds - Auscultation of the heart reveals - No Murmurs and No Systolic Clicks.  Abdomen Inspection Inspection of the abdomen reveals - No Visible peristalsis and No Abnormal pulsations. Umbilicus - No Bleeding, No Urine drainage. Palpation/Percussion Palpation and Percussion of the abdomen reveal - Soft, Non Tender, No Rebound tenderness, No Rigidity (guarding) and No Cutaneous hyperesthesia.  Male Genitourinary Sexual Maturity Tanner 5 - Adult hair pattern and Adult penile size and shape. Note: Obvious right groin inguinal hernia. Impulse on left side suspicious for left inguinal hernia as well. Normal external genitalia. Epididymi, testes, and spermatic cords normal without any masses.  Peripheral Vascular Upper Extremity Inspection - Left - No Cyanotic nailbeds, Not Ischemic. Right - No Cyanotic nailbeds, Not Ischemic.  Neurologic Neurologic evaluation reveals -normal attention span and ability to concentrate, able to name objects and repeat phrases. Appropriate fund of knowledge , normal sensation and normal coordination. Mental Status Affect - not angry, not paranoid. Cranial Nerves-Normal  Bilaterally. Gait-Normal.  Neuropsychiatric Mental status exam performed with findings of-able to articulate well with normal speech/language, rate, volume and coherence, thought content normal with ability to perform basic computations and apply abstract reasoning and no evidence of hallucinations, delusions, obsessions or homicidal/suicidal ideation.  Musculoskeletal Global Assessment Spine, Ribs and Pelvis - no instability, subluxation or laxity. Right Upper Extremity - no instability, subluxation or laxity.  Lymphatic Head & Neck  General Head & Neck Lymphatics: Bilateral - Description - No Localized lymphadenopathy. Axillary  General Axillary Region: Bilateral - Description - No Localized lymphadenopathy. Femoral & Inguinal  Generalized Femoral & Inguinal Lymphatics: Left - Description - No Localized lymphadenopathy. Right - Description - No Localized lymphadenopathy.    Assessment & Plan Adin Hector MD; 04/15/2016 6:03 PM)  END-STAGE RENAL DISEASE NEEDING DIALYSIS (N18.6) Impression: He is stabilized on hemodialysis. He is interested in peritoneal dialysis since he can do it at nighttime and still be able to work. Hemodialysis 3 times a week is getting disruptive. Need to fix his hernias as well. I did caution him that sometimes CAPD dialysis catheters never work right & need to be removed. They can get infected or malpositioned. I will try to minimize but cannot eliminate totally the risks. He is interested in proceeding. We'll work to do this at a convenient time.  Because he gets dialysis on Tuesday Thursday Saturday, plan to do an off dialysis day = MWF. [place].  He has seen the video and had some preliminary trailing at his dialysis center up in [place]. I strongly recommend he keep them informed as they are his lifeline after placement of the catheter. This discussion was made with the help of a phone interpreter in the office. Patient felt very comfortable  discussing most of this in [place] anyway. Wished to be safe.  Current Plans Pt Education - [facility] Peritoneal Dialysis catheter placememt - CAPD The anatomy & physiology of peritoneum was discussed. Natural history risks without surgery of worsening renal failure was discussed. I feel the risks of no intervention will lead to serious problems that outweigh the operative risks; therefore, I recommended placement of a peritoneal dialysis catheter. I explained laparoscopic techniques with possible need for an open approach.  Risks such as bleeding, infection, abscess, injury to other organs, catheter occlusion or malpositioning, reoperation to remove/reposition the catheter, heart attack, death, and other risks were discussed. I noted a good likelihood this will help address the problem. Possibility that this will not be enough to compensate for the renal failure & need for further treatment such as hemodialysis was explained. Goals of post-operative recovery were discussed as well. We will work to minimize complications.  The patient is/will be getting training on catheter use by dialysis nursing before and after surgery. I stressed the importance of meticulous care & sterile technique to prevent catheter problems. Questions were answered. The patient expresses understanding & wishes to proceed with surgery.  BILATERAL INGUINAL HERNIA WITHOUT OBSTRUCTION OR GANGRENE, RECURRENCE NOT SPECIFIED (K40.20) Impression: Definite right and probable left inguinal hernias. Would repair these 2 as they will definitely worsen over time, especially in the setting of peritoneal dialysis. Reasonable to do laparoscopically.  We'll try and place mesh appropriately and hopefully can get the peritoneal dialysis catheter in a good position as well without disrupting the mesh. May be a challenge with the bilateral hernias. We will see.  PREOP - ING HERNIA - ENCOUNTER FOR PREOPERATIVE EXAMINATION FOR GENERAL SURGICAL PROCEDURE  (Z01.818)  Current Plans You are being scheduled for surgery - Our schedulers will call you.  You should hear from our office's scheduling department within 5 working days about the location, date, and time of surgery. We try to make accommodations for patient's preferences in scheduling surgery, but sometimes the OR schedule or the surgeon's schedule prevents [place] from making those accommodations.  If you have not heard from our office [phone]) in 5 working days, call the office and ask for your surgeon's nurse.  If you have other questions  about your diagnosis, plan, or surgery, call the office and ask for your surgeon's nurse.  Written instructions provided The anatomy & physiology of the abdominal wall and pelvic floor was discussed. The pathophysiology of hernias in the inguinal and pelvic region was discussed. Natural history risks such as progressive enlargement, pain, incarceration, and strangulation was discussed. Contributors to complications such as smoking, obesity, diabetes, prior surgery, etc were discussed.  I feel the risks of no intervention will lead to serious problems that outweigh the operative risks; therefore, I recommended surgery to reduce and repair the hernia. I explained laparoscopic techniques with possible need for an open approach. I noted usual use of mesh to patch and/or buttress hernia repair  Risks such as bleeding, infection, abscess, need for further treatment, heart attack, death, and other risks were discussed. I noted a good likelihood this will help address the problem. Goals of post-operative recovery were discussed as well. Possibility that this will not correct all symptoms was explained. I stressed the importance of low-impact activity, aggressive pain control, avoiding constipation, & not pushing through pain to minimize risk of post-operative chronic pain or injury. Possibility of reherniation was discussed. We will work to minimize  complications.  An educational handout further explaining the pathology & treatment options was given as well. Questions were answered. The patient expresses understanding & wishes to proceed with surgery.  Pt Education - Pamphlet Given - Laparoscopic Hernia Repair: discussed with patient and provided information. Pt Education - [facility]CS Pain Control (Lexie Morini) Pt Education - [facility] Hernia Post-Op HCI (Iyanah Demont): discussed with patient and provided information.  Adin Hector, M.D., F.A.C.S. Gastrointestinal and Minimally Invasive Surgery [facility] [address] [place], [place] [address] [phone] Main / Paging

## 2016-05-28 ENCOUNTER — Ambulatory Visit (INDEPENDENT_AMBULATORY_CARE_PROVIDER_SITE_OTHER): Payer: Managed Care, Other (non HMO) | Admitting: Nurse Practitioner

## 2016-05-28 ENCOUNTER — Encounter: Payer: Self-pay | Admitting: Nurse Practitioner

## 2016-05-28 VITALS — BP 138/64 | HR 76 | Temp 99.4°F | Ht 65.0 in | Wt 134.0 lb

## 2016-05-28 DIAGNOSIS — J209 Acute bronchitis, unspecified: Secondary | ICD-10-CM | POA: Diagnosis not present

## 2016-05-28 MED ORDER — AZITHROMYCIN 250 MG PO TABS: ORAL_TABLET | ORAL | 0 refills | Status: DC

## 2016-05-28 NOTE — Progress Notes (Signed)
Subjective:     Craig Fisher is a 55 y.o. male here for evaluation of a cough, he is here today with his daughter who is his translator, patient speaks moderate [place] language. Onset of symptoms was 2 days ago. Symptoms have been rapidly worsening since that time. The cough is dry, harsh and painful and is aggravated by nothing. Associated symptoms include: heartburn and shortness of breath. Patient does not have a history of asthma. Patient does not have a history of environmental allergens. Patient has not traveled recently. Patient does not have a history of smoking. Patient has not had a previous chest x-ray. Patient has had a PPD done.  The following portions of the patient's history were reviewed and updated as appropriate: allergies, current medications, past family history, past medical history, past social history, past surgical history and problem list.  Review of Systems Constitutional: positive for anorexia, fatigue, malaise and weight loss Eyes: negative Ears, nose, mouth, throat, and face: negative Respiratory: positive for cough and pleurisy/chest pain, negative for hemoptysis, sputum, stridor and wheezing Cardiovascular: negative Gastrointestinal: positive for nausea and vomiting    Objective:    Oxygen saturation 98% on room air General appearance: alert, cooperative, fatigued and mild distress Head: Normocephalic, without obvious abnormality, atraumatic Eyes: conjunctivae/corneas clear. PERRL, EOM's intact. Fundi benign. Ears: normal TM's and external ear canals both ears Nose: Nares normal. Septum midline. Mucosa normal. No drainage or sinus tenderness., clear discharge, turbinates normal, pink, sinus tenderness bilateral Throat: lips, mucosa, and tongue normal; teeth and gums normal Neck: no adenopathy, no carotid bruit, no JVD, supple, symmetrical, trachea midline and thyroid not enlarged, symmetric, no tenderness/mass/nodules Lungs: diminished breath sounds RLL Chest  wall: no tenderness Heart: regular rate and rhythm Abdomen: soft, non-tender; bowel sounds normal; no masses,  no organomegaly Lymph nodes: Cervical adenopathy: palpable nodes    Assessment:    Acute Bronchitis    Plan:    Antibiotics per medication orders. Antitussives per medication orders. Avoid exposure to tobacco smoke and fumes. B-agonist inhaler. Call if shortness of breath worsens, blood in sputum, change in character of cough, development of fever or chills, inability to maintain nutrition and hydration. Avoid exposure to tobacco smoke and fumes.    1. Acute bronchitis, unspecified organism Increase fluid intake Force fluids Motrin or tylenol OTC OTC decongestant - azithromycin (ZITHROMAX Z-PAK) 250 MG tablet; As directed  Dispense: 6 tablet; Refill: 0  RTO prn if symptom worsens or does not improve in 2 days   Jari Favre, RN, BSN, AGNP Student  [place], [place]

## 2016-05-28 NOTE — Patient Instructions (Addendum)
Bronquitis aguda (Acute Bronchitis) La bronquitis es una inflamacin de las vas respiratorias que se extienden desde la trquea [facility] pulmones (bronquios). La inflamacin produce la formacin de mucosidad. Esto produce tos, que es el sntoma ms frecuente de la bronquitis.  Cuando la bronquitis es [place], generalmente comienza de [place] sbita y desaparece luego de un par de semanas. El hbito de fumar, las alergias y el asma pueden empeorar la bronquitis. Los episodios repetidos de bronquitis pueden causar ms problemas pulmonares.  CAUSAS La causa ms frecuente de bronquitis aguda es el mismo virus que produce el resfro. El virus puede propagarse de Ardelia Mems persona a la otra (contagioso) a travs de la tos y los estornudos, y al tocar objetos contaminados. [place].  Cristy Hilts.  Tos con mucosidad.  Dolores [employer] cuerpo.  Congestin en el pecho.  Escalofros.  Falta de aire.  Dolor de Investment banker, operational. DIAGNSTICO  La bronquitis aguda en general se diagnostica con un examen fsico. El mdico tambin le har preguntas sobre su historia clnica. En algunos casos se indican otros estudios, como radiografas, para Clinical research associate.  TRATAMIENTO  La bronquitis aguda generalmente desaparece en un par de semanas. Con frecuencia, no es Systems analyst. Los medicamentos se indican para aliviar la fiebre o la tos. Generalmente, no es necesario el uso de antibiticos, pero pueden recetarse en ciertas ocasiones. En algunos casos, se recomienda el uso de un inhalador para mejorar la falta de aire y Aeronautical engineer tos. Un vaporizador de aire fro podr ayudarlo a [employer] bronquiales y Armed forces technical officer su eliminacin.  INSTRUCCIONES PARA EL CUIDADO EN EL HOGAR  Descanse lo suficiente.  Beba lquidos en abundancia para mantener la orina de color claro o amarillo plido (excepto que padezca una enfermedad que requiera la restriccin de lquidos). El aumento  de lquidos puede ayudar a que las secreciones respiratorias (esputo) sean menos espesas y a reducir la congestin del pecho, y Mining engineer deshidratacin.  Tome los medicamentos solamente como se lo haya indicado el mdico.  Si le recetaron antibiticos, asegrese de terminarlos, incluso si comienza a sentirse mejor.  Evite fumar o aspirar el humo de otros fumadores. La exposicin al humo del cigarrillo o a irritantes qumicos har que la bronquitis empeore. Si fuma, considere el uso de goma de Higher education careers adviser o la aplicacin de parches en la piel que contengan nicotina para Public house manager los sntomas de abstinencia. Si deja de fumar, sus pulmones se curarn ms rpido.  Reduzca la probabilidad de otro episodio de bronquitis aguda lavando sus manos con frecuencia, evitando a las personas que tengan sntomas y tratando de no tocarse las manos con la boca, la nariz o los ojos.  Concurra a todas las visitas de control como se lo haya indicado el mdico. SOLICITE ATENCIN MDICA SI: Los sntomas no mejoran despus de una semana de [place].  SOLICITE ATENCIN MDICA DE INMEDIATO SI:  Comienza a tener fiebre o escalofros cada vez ms intensos.  Siente dolor en el pecho.  Le falta el aire de manera preocupante.  La flema tiene [place].  Se deshidrata.  Se desmaya o siente que va a desmayarse de forma repetida.  Tiene vmitos que se repiten.  Tiene un dolor de cabeza intenso. ASEGRESE DE QUE:   Comprende estas instrucciones.  Controlar su afeccin.  Recibir ayuda de inmediato si no mejora o si empeora.   Esta informacin no tiene Marine scientist el consejo del mdico. Asegrese de hacerle al mdico cualquier  pregunta que tenga.   Document Released: 07/05/2005 Document Revised: 07/26/2014 Elsevier Interactive Patient Education 2016 Scissors cough patient instructions here.

## 2016-06-25 ENCOUNTER — Ambulatory Visit: Payer: Managed Care, Other (non HMO) | Admitting: Family

## 2016-07-02 ENCOUNTER — Encounter (HOSPITAL_COMMUNITY)
Admission: RE | Admit: 2016-07-02 | Discharge: 2016-07-02 | Disposition: A | Discharge status: Home / Self Care | Payer: Managed Care, Other (non HMO) | Source: Ambulatory Visit | Attending: Surgery | Admitting: Surgery

## 2016-07-02 ENCOUNTER — Encounter (HOSPITAL_COMMUNITY): Payer: Self-pay

## 2016-07-02 DIAGNOSIS — Z01812 Encounter for preprocedural laboratory examination: Secondary | ICD-10-CM | POA: Diagnosis not present

## 2016-07-02 LAB — CBC
HEMATOCRIT: 34.1 % — AB (ref 39.0–52.0)
HEMOGLOBIN: 11.2 g/dL — AB (ref 13.0–17.0)
MCH: 29.3 pg (ref 26.0–34.0)
MCHC: 32.8 g/dL (ref 30.0–36.0)
MCV: 89.3 fL (ref 78.0–100.0)
PLATELETS: 258 10*3/uL (ref 150–400)
RBC: 3.82 MIL/uL — AB (ref 4.22–5.81)
RDW: 13.5 % (ref 11.5–15.5)
WBC: 6.6 10*3/uL (ref 4.0–10.5)

## 2016-07-02 LAB — BASIC METABOLIC PANEL
ANION GAP: 7 (ref 5–15)
BUN: 40 mg/dL — ABNORMAL HIGH (ref 6–20)
CHLORIDE: 100 mmol/L — AB (ref 101–111)
CO2: 31 mmol/L (ref 22–32)
Calcium: 9 mg/dL (ref 8.9–10.3)
Creatinine, Ser: 8.24 mg/dL — ABNORMAL HIGH (ref 0.61–1.24)
GFR calc non Af Amer: 6 mL/min — ABNORMAL LOW (ref >60)
GFR, EST AFRICAN AMERICAN: 8 mL/min — AB (ref >60)
Glucose, Bld: 118 mg/dL — ABNORMAL HIGH (ref 65–99)
POTASSIUM: 5.8 mmol/L — AB (ref 3.5–5.1)
SODIUM: 138 mmol/L (ref 135–145)

## 2016-07-02 NOTE — Pre-Procedure Instructions (Signed)
Instrucciones Para Antes de la Ciruga   Su ciruga est programada para-(your procedure is scheduled on) Wednesday, July 07, 2016   [facility] Admitting - (enter) 6:30 A.M.    Por favor llame al [phone] si tiene algn problema en la maana de la ciruga. (please call if you have any problems the morning of surgery.)                  Recuerde: (Remember)   No coma alimentos ni tome lquidos, incluyendo agua, despus de la medianoche del  (Do not eat food or drink liquids including water after midnight on Tuesday, July 06, 2016   Tome estas medicinas en la maana de la ciruga con un SORBITO de agua (take these meds the morning of surgery with a SIP of water): amLODipine (NORVASC), hydrALAZINE (APRESOLINE), metoprolol (LOPRESSOR), timolol (BETIMOL) eye drops  Stop taking Aspirin,vitamins, fish oil and herbal medications. Do not take any NSAIDs ie: Ibuprofen, Advil, Naproxen, BC and Goody Powder or any medication containing Aspirin; stop now    How to Manage Your Diabetes Before and After Surgery  Why is it important to control my blood sugar before and after surgery? . Improving blood sugar levels before and after surgery helps healing and can limit problems. . A way of improving blood sugar control is eating a healthy diet by: o  Eating less sugar and carbohydrates o  Increasing activity/exercise o  Talking with your doctor about reaching your blood sugar goals . High blood sugars (greater than 180 mg/dL) can raise your risk of infections and slow your recovery, so you will need to focus on controlling your diabetes during the weeks before surgery. . Make sure that the doctor who takes care of your diabetes knows about your planned surgery including the date and location.  How do I manage my blood sugar before surgery? . Check your blood sugar at least 4 times a day, starting 2 days before surgery, to make  sure that the level is not too high or low. o Check your blood sugar the morning of your surgery when you wake up and every 2 hours until you get to the Short Stay unit. . If your blood sugar is less than 70 mg/dL, you will need to treat for low blood sugar: o Do not take insulin. o Treat a low blood sugar (less than 70 mg/dL) with  cup of clear juice (cranberry or apple), 4 glucose tablets, OR glucose gel. o Recheck blood sugar in 15 minutes after treatment (to make sure it is greater than 70 mg/dL). If your blood sugar is not greater than 70 mg/dL on recheck, call [phone] for further instructions. . Report your blood sugar to the short stay nurse when you get to Short Stay.  . If you are admitted to the hospital after surgery: o Your blood sugar will be checked by the staff and you will probably be given insulin after surgery (instead of oral diabetes medicines) to make sure you have good blood sugar levels. o The goal for blood sugar control after surgery is 80-180 mg/dL.  WHAT DO I DO ABOUT MY DIABETES MEDICATION?   Marland Kitchen Do not take oral diabetes medicines (pills) the morning of surgery.  . THE NIGHT BEFORE SURGERY, take 10 units of Lantus Insulin     Patient Signature:  Date:   Nurse Signature:  Date:   Reviewed and Endorsed by [facility] Patient Education Committee, August 2015 Puede cepillarse los  dientes en la maana de la [place]. (you may brush your teeth the morning of surgery)   No use joyas, maquillaje de ojos, lpiz labial, crema para el cuerpo o esmalte de uas oscuro. (Do not wear jewelry, eye makeup, lipstick, body lotion, or dark fingernail polish)   No puede usar desodorante. (you may wear deodorant)   Si va a ser ingresado despues de la ciruga, deje la maleta en el carro hasta que se le haya asignado una habitacin. (If you are to be admitted after surgery, leave suitcase in car until your room has been assigned.)   A los pacientes que se les d de alta el  mismo da no se les permitir manejar a casa.  (Patients discharged on the day of surgery will not be allowed to drive home)   Use ropa suelta y cmoda de regreso a casa. (wear loose comfortable clothes for ride home)    Firma del paciente (patient signature) ______________________________________   Craig Fisher  07/02/2016      [facility] [place], [place] [address] Phone: [phone] Fax: [place], [address] [place] [address] Phone: [phone] Fax: [phone]    Your procedure is scheduled on Wednesday, July 07, 2016  Report to [facility] Admitting at 6:30 A.M.  Call this number if you have problems the morning of surgery:  [phone]   Remember:  Do not eat food or drink liquids after midnight  Tuesday, July 06, 2016   Take these medicines the morning of surgery with A SIP OF WATER:  amLODipine (NORVASC), hydrALAZINE (APRESOLINE), metoprolol (LOPRESSOR), timolol (BETIMOL) eye drops  Stop taking Aspirin,vitamins, fish oil and herbal medications. Do not take any NSAIDs ie: Ibuprofen, Advil, Naproxen, BC and Goody Powder or any medication containing Aspirin; stop now    How to Manage Your Diabetes Before and After Surgery  Why is it important to control my blood sugar before and after surgery? . Improving blood sugar levels before and after surgery helps healing and can limit problems. . A way of improving blood sugar control is eating a healthy diet by: o  Eating less sugar and carbohydrates o  Increasing activity/exercise o  Talking with your doctor about reaching your blood sugar goals . High blood sugars (greater than 180 mg/dL) can raise your risk of infections and slow your recovery, so you will need to focus on controlling your diabetes during the weeks before surgery. . Make sure that the doctor who takes care of  your diabetes knows about your planned surgery including the date and location.  How do I manage my blood sugar before surgery? . Check your blood sugar at least 4 times a day, starting 2 days before surgery, to make sure that the level is not too high or low. o Check your blood sugar the morning of your surgery when you wake up and every 2 hours until you get to the Short Stay unit. . If your blood sugar is less than 70 mg/dL, you will need to treat for low blood sugar: o Do not take insulin. o Treat a low blood sugar (less than 70 mg/dL) with  cup of clear juice (cranberry or apple), 4 glucose tablets, OR glucose gel. o Recheck blood sugar in 15 minutes after treatment (to make sure it is greater than 70 mg/dL). If your blood sugar is not  greater than 70 mg/dL on recheck, call [phone] for further instructions. . Report your blood sugar to the short stay nurse when you get to Short Stay.  . If you are admitted to the hospital after surgery: o Your blood sugar will be checked by the staff and you will probably be given insulin after surgery (instead of oral diabetes medicines) to make sure you have good blood sugar levels. o The goal for blood sugar control after surgery is 80-180 mg/dL.  WHAT DO I DO ABOUT MY DIABETES MEDICATION?   . THE NIGHT BEFORE SURGERY, take 10 units of Lantus Insulin     Patient Signature:  Date:   Nurse Signature:  Date:   Reviewed and Endorsed by [facility] Patient Education Committee, August 2015  Do not wear jewelry, make-up or nail polish.  Do not wear lotions, powders, or perfumes, or deoderant.  Do not shave 48 hours prior to surgery.  Men may shave face and neck.  Do not bring valuables to the hospital.  [facility] is not responsible for any belongings or valuables.  Contacts, dentures or bridgework may not be worn into surgery.  Leave your suitcase in the car.  After surgery it may be brought to your room.  For patients admitted to the  hospital, discharge time will be determined by your treatment team.  Patients discharged the day of surgery will not be allowed to drive home.   Special instructions: Shower the night before and the morning of surgery with CHG  Please read over the following fact sheets that you were given. Pain Booklet, Coughing and Deep Breathing and Surgical Site Infection Prevention

## 2016-07-03 LAB — HEMOGLOBIN A1C
HEMOGLOBIN A1C: 6.6 % — AB (ref 4.8–5.6)
MEAN PLASMA GLUCOSE: 143 mg/dL

## 2016-07-06 ENCOUNTER — Encounter (HOSPITAL_COMMUNITY): Payer: Self-pay | Admitting: *Deleted

## 2016-07-06 NOTE — Progress Notes (Addendum)
Pre- op  Call made using [employer], [employer]pre[employer] [place].   Patient reported that he was given a bottle of water to drink 2 hours prior to arriving to the hospital, (patient was given water when he came in late for PAT, labs were drawn and water instructions were given..  I asked patient to check CBG prior to drinking waster and if he has to treat the CBG < 70 with juice that he should only drink 1/2 cup of the bottle of water.  I instructed patient to check CBG to check CBG and if it is less than 70 to treat it with Glucose Gel, Glucose tablets or 1/2 cup of clear juice like apple juice or cranberry juice, or 1/2 cup of regular soda. (not cream soda). I instructed patient to recheck CBG in 15 minutes and if CBG is not greater than 70, to  Call [phone] (pre- op). If it is before pre-op opens to retreat as before and recheck CBG in 15 minutes. I told patient to make note of time that liquid is taken and amount, that surgical time may have to be adjusted.   I had patient repeat instructions on treating CBG and he did so without any problems.

## 2016-07-07 ENCOUNTER — Ambulatory Visit (HOSPITAL_COMMUNITY): Payer: Managed Care, Other (non HMO) | Admitting: Certified Registered"

## 2016-07-07 ENCOUNTER — Ambulatory Visit (HOSPITAL_COMMUNITY)
Admission: RE | Admit: 2016-07-07 | Discharge: 2016-07-07 | Disposition: A | Discharge status: Home / Self Care | Payer: Managed Care, Other (non HMO) | Source: Ambulatory Visit | Attending: Surgery | Admitting: Surgery

## 2016-07-07 ENCOUNTER — Encounter (HOSPITAL_COMMUNITY): Payer: Self-pay | Admitting: *Deleted

## 2016-07-07 ENCOUNTER — Encounter (HOSPITAL_COMMUNITY)
Admission: RE | Disposition: A | Discharge status: Home / Self Care | Payer: Self-pay | Source: Ambulatory Visit | Attending: Surgery

## 2016-07-07 DIAGNOSIS — E1122 Type 2 diabetes mellitus with diabetic chronic kidney disease: Secondary | ICD-10-CM | POA: Insufficient documentation

## 2016-07-07 DIAGNOSIS — N186 End stage renal disease: Secondary | ICD-10-CM

## 2016-07-07 DIAGNOSIS — E11319 Type 2 diabetes mellitus with unspecified diabetic retinopathy without macular edema: Secondary | ICD-10-CM | POA: Diagnosis not present

## 2016-07-07 DIAGNOSIS — Z7982 Long term (current) use of aspirin: Secondary | ICD-10-CM | POA: Diagnosis not present

## 2016-07-07 DIAGNOSIS — Z885 Allergy status to narcotic agent status: Secondary | ICD-10-CM | POA: Diagnosis not present

## 2016-07-07 DIAGNOSIS — Z992 Dependence on renal dialysis: Secondary | ICD-10-CM

## 2016-07-07 DIAGNOSIS — H5462 Unqualified visual loss, left eye, normal vision right eye: Secondary | ICD-10-CM | POA: Diagnosis not present

## 2016-07-07 DIAGNOSIS — E7800 Pure hypercholesterolemia, unspecified: Secondary | ICD-10-CM | POA: Diagnosis not present

## 2016-07-07 DIAGNOSIS — R0602 Shortness of breath: Secondary | ICD-10-CM | POA: Diagnosis not present

## 2016-07-07 DIAGNOSIS — I120 Hypertensive chronic kidney disease with stage 5 chronic kidney disease or end stage renal disease: Secondary | ICD-10-CM | POA: Insufficient documentation

## 2016-07-07 DIAGNOSIS — K4020 Bilateral inguinal hernia, without obstruction or gangrene, not specified as recurrent: Secondary | ICD-10-CM | POA: Insufficient documentation

## 2016-07-07 DIAGNOSIS — Z794 Long term (current) use of insulin: Secondary | ICD-10-CM | POA: Diagnosis not present

## 2016-07-07 DIAGNOSIS — N184 Chronic kidney disease, stage 4 (severe): Secondary | ICD-10-CM | POA: Diagnosis present

## 2016-07-07 DIAGNOSIS — I10 Essential (primary) hypertension: Secondary | ICD-10-CM | POA: Diagnosis present

## 2016-07-07 HISTORY — PX: INGUINAL HERNIA REPAIR: SHX194

## 2016-07-07 HISTORY — PX: INSERTION OF MESH: SHX5868

## 2016-07-07 HISTORY — PX: CAPD INSERTION: SHX5233

## 2016-07-07 HISTORY — DX: End stage renal disease: N18.6

## 2016-07-07 HISTORY — DX: Type 2 diabetes mellitus with unspecified diabetic retinopathy without macular edema: E11.319

## 2016-07-07 LAB — GLUCOSE, CAPILLARY
GLUCOSE-CAPILLARY: 79 mg/dL (ref 65–99)
Glucose-Capillary: 101 mg/dL — ABNORMAL HIGH (ref 65–99)
Glucose-Capillary: 73 mg/dL (ref 65–99)

## 2016-07-07 LAB — POCT I-STAT 4, (NA,K, GLUC, HGB,HCT)
Glucose, Bld: 86 mg/dL (ref 65–99)
HCT: 32 % — ABNORMAL LOW (ref 39.0–52.0)
Hemoglobin: 10.9 g/dL — ABNORMAL LOW (ref 13.0–17.0)
Potassium: 3.6 mmol/L (ref 3.5–5.1)
Sodium: 135 mmol/L (ref 135–145)

## 2016-07-07 SURGERY — LAPAROSCOPIC INSERTION CONTINUOUS AMBULATORY PERITONEAL DIALYSIS  (CAPD) CATHETER
Anesthesia: General | Site: Groin

## 2016-07-07 MED ORDER — GABAPENTIN 300 MG PO CAPS
300.0000 mg | ORAL_CAPSULE | ORAL | Status: AC
  Administered 2016-07-07: 300 mg via ORAL

## 2016-07-07 MED ORDER — HYDROMORPHONE HCL 1 MG/ML IJ SOLN
0.2500 mg | INTRAMUSCULAR | Status: DC | PRN
  Administered 2016-07-07: 0.25 mg via INTRAVENOUS
  Administered 2016-07-07: 0.5 mg via INTRAVENOUS

## 2016-07-07 MED ORDER — SODIUM CHLORIDE 0.9 % IV SOLN
INTRAVENOUS | Status: DC | PRN
  Administered 2016-07-07: 500 mL

## 2016-07-07 MED ORDER — NEOSTIGMINE METHYLSULFATE 5 MG/5ML IV SOSY
PREFILLED_SYRINGE | INTRAVENOUS | Status: AC
  Filled 2016-07-07: qty 5

## 2016-07-07 MED ORDER — FENTANYL CITRATE (PF) 100 MCG/2ML IJ SOLN
INTRAMUSCULAR | Status: DC | PRN
  Administered 2016-07-07 (×2): 100 ug via INTRAVENOUS

## 2016-07-07 MED ORDER — SODIUM CHLORIDE 0.9 % IV SOLN
INTRAVENOUS | Status: DC | PRN
  Administered 2016-07-07: 08:00:00 via INTRAVENOUS

## 2016-07-07 MED ORDER — OXYCODONE HCL 5 MG PO TABS
ORAL_TABLET | ORAL | Status: AC
  Filled 2016-07-07: qty 2

## 2016-07-07 MED ORDER — ROCURONIUM BROMIDE 100 MG/10ML IV SOLN
INTRAVENOUS | Status: DC | PRN
  Administered 2016-07-07: 10 mg via INTRAVENOUS
  Administered 2016-07-07: 40 mg via INTRAVENOUS

## 2016-07-07 MED ORDER — PROMETHAZINE HCL 25 MG/ML IJ SOLN
INTRAMUSCULAR | Status: AC
  Filled 2016-07-07: qty 1

## 2016-07-07 MED ORDER — HEPARIN SOD (PORK) LOCK FLUSH 100 UNIT/ML IV SOLN
INTRAVENOUS | Status: DC | PRN
  Administered 2016-07-07: 500 [IU]

## 2016-07-07 MED ORDER — PROPOFOL 10 MG/ML IV BOLUS
INTRAVENOUS | Status: AC
  Filled 2016-07-07: qty 20

## 2016-07-07 MED ORDER — BUPIVACAINE-EPINEPHRINE (PF) 0.25% -1:200000 IJ SOLN
INTRAMUSCULAR | Status: AC
  Filled 2016-07-07: qty 60

## 2016-07-07 MED ORDER — PHENYLEPHRINE HCL 10 MG/ML IJ SOLN
INTRAMUSCULAR | Status: DC | PRN
  Administered 2016-07-07: 120 ug via INTRAVENOUS

## 2016-07-07 MED ORDER — NEOSTIGMINE METHYLSULFATE 10 MG/10ML IV SOLN
INTRAVENOUS | Status: DC | PRN
  Administered 2016-07-07: 3 mg via INTRAVENOUS

## 2016-07-07 MED ORDER — OXYCODONE HCL 5 MG PO TABS: 5.0000 mg | ORAL_TABLET | ORAL | 0 refills | Status: DC | PRN

## 2016-07-07 MED ORDER — ACETAMINOPHEN 500 MG PO TABS: 1000.0000 mg | ORAL_TABLET | ORAL | Status: DC

## 2016-07-07 MED ORDER — METOPROLOL TARTRATE 50 MG PO TABS
ORAL_TABLET | ORAL | Status: AC
  Filled 2016-07-07: qty 1

## 2016-07-07 MED ORDER — PROMETHAZINE HCL 25 MG/ML IJ SOLN
6.2500 mg | INTRAMUSCULAR | Status: DC | PRN
  Administered 2016-07-07: 6.25 mg via INTRAVENOUS

## 2016-07-07 MED ORDER — CEFAZOLIN SODIUM-DEXTROSE 2-4 GM/100ML-% IV SOLN
INTRAVENOUS | Status: AC
  Filled 2016-07-07: qty 100

## 2016-07-07 MED ORDER — MIDAZOLAM HCL 2 MG/2ML IJ SOLN
INTRAMUSCULAR | Status: AC
  Filled 2016-07-07: qty 2

## 2016-07-07 MED ORDER — METOPROLOL TARTRATE 50 MG PO TABS
50.0000 mg | ORAL_TABLET | Freq: Once | ORAL | Status: AC
  Administered 2016-07-07: 50 mg via ORAL
  Filled 2016-07-07: qty 1

## 2016-07-07 MED ORDER — MIDAZOLAM HCL 2 MG/2ML IJ SOLN
INTRAMUSCULAR | Status: DC | PRN
  Administered 2016-07-07: 2 mg via INTRAVENOUS

## 2016-07-07 MED ORDER — BUPIVACAINE-EPINEPHRINE 0.25% -1:200000 IJ SOLN
INTRAMUSCULAR | Status: DC | PRN
  Administered 2016-07-07: 31 mL

## 2016-07-07 MED ORDER — GABAPENTIN 300 MG PO CAPS
ORAL_CAPSULE | ORAL | Status: AC
  Filled 2016-07-07: qty 1

## 2016-07-07 MED ORDER — LIDOCAINE 2% (20 MG/ML) 5 ML SYRINGE
INTRAMUSCULAR | Status: AC
  Filled 2016-07-07: qty 5

## 2016-07-07 MED ORDER — FENTANYL CITRATE (PF) 100 MCG/2ML IJ SOLN
INTRAMUSCULAR | Status: AC
  Filled 2016-07-07: qty 4

## 2016-07-07 MED ORDER — OXYCODONE HCL 5 MG PO TABS
5.0000 mg | ORAL_TABLET | ORAL | Status: DC | PRN
  Administered 2016-07-07: 10 mg via ORAL

## 2016-07-07 MED ORDER — CEFAZOLIN SODIUM-DEXTROSE 2-4 GM/100ML-% IV SOLN
2.0000 g | INTRAVENOUS | Status: AC
  Administered 2016-07-07: 2 g via INTRAVENOUS

## 2016-07-07 MED ORDER — PROPOFOL 10 MG/ML IV BOLUS
INTRAVENOUS | Status: DC | PRN
  Administered 2016-07-07: 160 mg via INTRAVENOUS

## 2016-07-07 MED ORDER — CHLORHEXIDINE GLUCONATE CLOTH 2 % EX PADS: 6.0000 | MEDICATED_PAD | Freq: Once | CUTANEOUS | Status: DC

## 2016-07-07 MED ORDER — SODIUM CHLORIDE 0.9 % IR SOLN: Status: DC | PRN

## 2016-07-07 MED ORDER — PHENYLEPHRINE HCL 10 MG/ML IJ SOLN
INTRAMUSCULAR | Status: DC | PRN
  Administered 2016-07-07: 50 ug/min via INTRAVENOUS

## 2016-07-07 MED ORDER — FENTANYL CITRATE (PF) 100 MCG/2ML IJ SOLN: 25.0000 ug | INTRAMUSCULAR | Status: DC | PRN

## 2016-07-07 MED ORDER — HEPARIN SOD (PORK) LOCK FLUSH 100 UNIT/ML IV SOLN
INTRAVENOUS | Status: AC
  Filled 2016-07-07: qty 5

## 2016-07-07 MED ORDER — 0.9 % SODIUM CHLORIDE (POUR BTL) OPTIME
TOPICAL | Status: DC | PRN
  Administered 2016-07-07: 1000 mL

## 2016-07-07 MED ORDER — LIDOCAINE HCL (CARDIAC) 20 MG/ML IV SOLN
INTRAVENOUS | Status: DC | PRN
  Administered 2016-07-07: 80 mg via INTRAVENOUS

## 2016-07-07 MED ORDER — HYDROMORPHONE HCL 2 MG/ML IJ SOLN
INTRAMUSCULAR | Status: AC
  Filled 2016-07-07: qty 1

## 2016-07-07 MED ORDER — ROCURONIUM BROMIDE 50 MG/5ML IV SOSY
PREFILLED_SYRINGE | INTRAVENOUS | Status: AC
  Filled 2016-07-07: qty 5

## 2016-07-07 MED ORDER — ACETAMINOPHEN 500 MG PO TABS
ORAL_TABLET | ORAL | Status: AC
  Administered 2016-07-07: 1000 mg
  Filled 2016-07-07: qty 1

## 2016-07-07 MED ORDER — GLYCOPYRROLATE 0.2 MG/ML IJ SOLN
INTRAMUSCULAR | Status: DC | PRN
  Administered 2016-07-07: .5 mg via INTRAVENOUS

## 2016-07-07 MED ORDER — ONDANSETRON HCL 4 MG/2ML IJ SOLN
INTRAMUSCULAR | Status: AC
  Filled 2016-07-07: qty 2

## 2016-07-07 MED ORDER — ONDANSETRON HCL 4 MG/2ML IJ SOLN
INTRAMUSCULAR | Status: DC | PRN
  Administered 2016-07-07: 4 mg via INTRAVENOUS

## 2016-07-07 SURGICAL SUPPLY — 64 items
ADAPTER TITANIUM MEDIONICS (MISCELLANEOUS) ×4 IMPLANT
BAG DECANTER FOR FLEXI CONT (MISCELLANEOUS) ×4 IMPLANT
BLADE SURG ROTATE 9660 (MISCELLANEOUS) IMPLANT
CANISTER SUCTION 2500CC (MISCELLANEOUS) IMPLANT
CATH EXTENDED DIALYSIS (CATHETERS) ×4 IMPLANT
CHLORAPREP W/TINT 26ML (MISCELLANEOUS) ×4 IMPLANT
COVER SURGICAL LIGHT HANDLE (MISCELLANEOUS) ×4 IMPLANT
DECANTER SPIKE VIAL GLASS SM (MISCELLANEOUS) ×4 IMPLANT
DEVICE TROCAR PUNCTURE CLOSURE (ENDOMECHANICALS) ×4 IMPLANT
DISSECTOR BLUNT TIP ENDO 5MM (MISCELLANEOUS) IMPLANT
DRAPE INCISE IOBAN 66X45 STRL (DRAPES) ×4 IMPLANT
DRAPE WARM FLUID 44X44 (DRAPE) ×4 IMPLANT
DRSG PAD ABDOMINAL 8X10 ST (GAUZE/BANDAGES/DRESSINGS) IMPLANT
DRSG TEGADERM 2-3/8X2-3/4 SM (GAUZE/BANDAGES/DRESSINGS) ×24 IMPLANT
DRSG TEGADERM 4X4.75 (GAUZE/BANDAGES/DRESSINGS) ×8 IMPLANT
ELECT REM PT RETURN 9FT ADLT (ELECTROSURGICAL) ×4
ELECTRODE REM PT RTRN 9FT ADLT (ELECTROSURGICAL) ×2 IMPLANT
GAUZE SPONGE 2X2 8PLY STRL LF (GAUZE/BANDAGES/DRESSINGS) ×2 IMPLANT
GAUZE SPONGE 4X4 12PLY STRL (GAUZE/BANDAGES/DRESSINGS) ×4 IMPLANT
GLOVE BIO SURGEON STRL SZ 6.5 (GLOVE) ×3 IMPLANT
GLOVE BIO SURGEON STRL SZ8 (GLOVE) ×4 IMPLANT
GLOVE BIO SURGEONS STRL SZ 6.5 (GLOVE) ×1
GLOVE BIOGEL PI IND STRL 6.5 (GLOVE) ×2 IMPLANT
GLOVE BIOGEL PI IND STRL 8 (GLOVE) ×4 IMPLANT
GLOVE BIOGEL PI IND STRL 8.5 (GLOVE) ×2 IMPLANT
GLOVE BIOGEL PI INDICATOR 6.5 (GLOVE) ×2
GLOVE BIOGEL PI INDICATOR 8 (GLOVE) ×4
GLOVE BIOGEL PI INDICATOR 8.5 (GLOVE) ×2
GLOVE ECLIPSE 7.5 STRL STRAW (GLOVE) ×4 IMPLANT
GLOVE ECLIPSE 8.0 STRL XLNG CF (GLOVE) ×8 IMPLANT
GLOVE SURG SS PI 8.0 STRL IVOR (GLOVE) ×4 IMPLANT
GOWN STRL REUS W/ TWL LRG LVL3 (GOWN DISPOSABLE) ×4 IMPLANT
GOWN STRL REUS W/ TWL XL LVL3 (GOWN DISPOSABLE) ×2 IMPLANT
GOWN STRL REUS W/TWL LRG LVL3 (GOWN DISPOSABLE) ×4
GOWN STRL REUS W/TWL XL LVL3 (GOWN DISPOSABLE) ×2
KIT BASIN OR (CUSTOM PROCEDURE TRAY) ×4 IMPLANT
KIT ROOM TURNOVER OR (KITS) ×4 IMPLANT
MESH ULTRAPRO 6X6 15CM15CM (Mesh General) ×12 IMPLANT
NEEDLE 22X1 1/2 (OR ONLY) (NEEDLE) ×4 IMPLANT
NS IRRIG 1000ML POUR BTL (IV SOLUTION) ×4 IMPLANT
PAD ARMBOARD 7.5X6 YLW CONV (MISCELLANEOUS) ×8 IMPLANT
PEN SKIN MARKING BROAD (MISCELLANEOUS) ×4 IMPLANT
SET EXT 12IN DIALYSIS STAY-SAF (MISCELLANEOUS) ×4 IMPLANT
SET IRRIG TUBING LAPAROSCOPIC (IRRIGATION / IRRIGATOR) IMPLANT
SLEEVE ENDOPATH XCEL 5M (ENDOMECHANICALS) ×4 IMPLANT
SPONGE GAUZE 2X2 STER 10/PKG (GAUZE/BANDAGES/DRESSINGS) ×2
STYLET FALLER (MISCELLANEOUS) ×4 IMPLANT
STYLET FALLER MEDIONICS (MISCELLANEOUS) ×4 IMPLANT
SUT MNCRL AB 4-0 PS2 18 (SUTURE) ×8 IMPLANT
SUT PDS AB 1 CT  36 (SUTURE) ×4
SUT PDS AB 1 CT 36 (SUTURE) ×4 IMPLANT
SUT PROLENE 2 0 CT2 30 (SUTURE) ×12 IMPLANT
SUT SILK 2 0 SH (SUTURE) IMPLANT
SUT VIC AB 2-0 SH 27 (SUTURE) ×2
SUT VIC AB 2-0 SH 27XBRD (SUTURE) ×2 IMPLANT
SYR 50ML LL SCALE MARK (SYRINGE) ×4 IMPLANT
SYR 5ML LL (SYRINGE) ×4 IMPLANT
TOWEL OR 17X24 6PK STRL BLUE (TOWEL DISPOSABLE) ×4 IMPLANT
TOWEL OR 17X26 10 PK STRL BLUE (TOWEL DISPOSABLE) ×4 IMPLANT
TRAY LAPAROSCOPIC MC (CUSTOM PROCEDURE TRAY) ×4 IMPLANT
TROCAR 5MMX150MM (TROCAR) ×4 IMPLANT
TROCAR XCEL BLUNT TIP 100MML (ENDOMECHANICALS) ×4 IMPLANT
TROCAR XCEL NON-BLD 5MMX100MML (ENDOMECHANICALS) ×4 IMPLANT
TUBING INSUFFLATION (TUBING) ×4 IMPLANT

## 2016-07-07 NOTE — Op Note (Signed)
07/07/2016  11:42 AM  PATIENT:  Craig Fisher  55 y.o. 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, request for CAPD  PROCEDURE: : LAPAROSCOPIC INSERTION CONTINUOUS AMBULATORY PERITONEAL DIALYSIS CATHETER WITH OMENTOPEXY REPAIR BILATERAL INGUINAL HERNIAS INSERTION OF MESH  SURGEON:  Adin Hector, MD  ASSISTANT:  Stan Head, MD, [facility]  ANESTHESIA:   local and IV sedation  EBL:  Total I/O In: 400 [I.V.:400] Out: 5 [Blood:5]  Delay start of Pharmacological VTE agent (>24hrs) due to surgical blood loss or risk of bleeding:  no  DRAINS:   It is a long tunneled CAPD peritoneal dialysis catheter ([employer]ics [id number] curl cath with titanium extender)   SPECIMEN:  No Specimen  DISPOSITION OF SPECIMEN:  N/A  COUNTS:  YES  PLAN OF CARE: Discharge to home after PACU  PATIENT DISPOSITION:  PACU - hemodynamically stable.  INDICATION: PAtient requesting CAPD catheter for dialysis.  Also has hernias that need repair  The anatomy & physiology of peritoneum was discussed.  Natural history risks without surgery of worsening renal failure was discussed.   I feel the risks of no intervention will lead to serious problems that outweigh the operative risks; therefore, I recommended placement of a peritoneal dialysis catheter.  I explained laparoscopic techniques with possible need for an open approach.    Risks such as bleeding, infection, abscess, injury to other organs, catheter occlusion or malpositioning, reoperation to remove/reposition the catheter, heart attack, death, and other risks were discussed.   I noted a good likelihood  this will help address the problem.  Possibility that this will not be enough to compensate for the renal failure & need for further treatment such as hemodialysis was explained.  Goals of post-operative recovery were discussed as well.  We will work to minimize complications.   The patient is/will be getting training on catheter use by dialysis nursing before and after surgery.  I stressed the importance of meticulous care & sterile technique to prevent catheter problems.  Questions were answered.  The patient expresses understanding & wishes to proceed with surgery.  The anatomy & physiology of the abdominal wall and pelvic floor was discussed.  The pathophysiology of hernias in the inguinal and pelvic region was discussed.  Natural history risks such as progressive enlargement, pain, incarceration & strangulation was discussed.   Contributors to complications such as smoking, obesity, diabetes, prior surgery, etc were discussed.    I feel the risks of no intervention will lead to serious problems that outweigh the operative risks; therefore, I recommended surgery to reduce and repair the hernia.  I explained laparoscopic techniques with possible need for an open approach.  I noted usual use of mesh to patch and/or buttress hernia repair  Risks such as bleeding, infection, abscess, need for further treatment, heart attack, death, and other risks were discussed.  I noted a good likelihood this will help address the problem.   Goals of post-operative recovery were discussed as well.  Possibility that this will not correct all symptoms was explained.  I stressed the importance of low-impact activity, aggressive pain control, avoiding constipation, & not pushing through pain to minimize risk of post-operative chronic pain or injury. Possibility  of reherniation was discussed.  We will work to minimize complications.     An educational handout further explaining the pathology & treatment options was given as  well.  Questions were answered.  The patient expresses understanding & wishes to proceed with surgery.  OR FINDINGS:   Bilateral direct inguinal hernias, right greater than left.  It is a long tunneled CAPD peritoneal dialysis catheter ([employer] [id number] curl cath with titanium extender) .  The curl tip of the catheter rests in the deep pelvis.   Entry into the peritoneum it is in the left lower quadrant region.  Deepest cuff in the periumbilical paramedian rectus muscle just deep to the fascia location.  The exit site of the catheter on the skin is in the left upper abdomen paramedian region, 10 cm inferior to costal ridge, medial clavicular line.  Blue adapter CAPD extension tubing attached  DESCRIPTION:   Informed consent was confirmed.  The patient underwent general anaesthesia without difficulty.  The patient was positioned appropriately.  VTE prevention in place.  The patient's abdomen was clipped, prepped, & draped in a sterile fashion.  Surgical timeout confirmed our plan.  The patient was positioned in reverse Trendelenburg.  Abdominal entry was gained using optical entry technique in the right upper abdomen.  Entry was clean.  I induced carbon dioxide insufflation.  Camera inspection revealed no injury.  Extra ports were carefully placed under direct laparoscopic visualization.   I made a transverse incision through the inferior umbilical fold.  I made a small transverse nick through the anterior rectus fascia contralateral to the inguinal hernia side and placed a 0-vicryl stitch through the fascia.  I placed a Hasson trocar into the preperitoneal plane.  Entry was clean.  We induced carbon dioxide insufflation. Camera inspection revealed no injury.  I used a 39mm angled scope to bluntly free the peritoneum off the infraumbilical anterior abdominal wall.  I created enough of a preperitoneal pocket to place 25mm ports into the right & left mid-abdomen into this preperitoneal cavity.  I  focused attention on the  RIGHT hernia(s) side since that was the dominant hernia side.   I used blunt & focused sharp dissection to free the peritoneum off the flank and down to the pubic rim.  I freed the anteriolateral bladder wall off the anteriolateral pelvic wall, sparing midline attachments.   I located a swath of peritoneum going into a hernia fascial defect at the  direct space consistent with  a direct space inguinal hernia..  I gradually freed the peritoneal hernia sac off safely and reduced it into the preperitoneal space.  I freed the peritoneum off the spermatic vessels & vas deferens.  I freed peritoneum off the retroperitoneum along the psoas muscle.  Spermatic cord lipoma was dissected away & removed.  I checked & assured hemostasis.    I turned attention on the opposite LEFT side.  I did dissection in a similar, mirror-image fashion. The patient had a direct space inguinal hernia.Marland Kitchen   Spermatic cord lipoma was dissected away & removed.    I checked & assured hemostasis.     I chose 15x15 cm sheets of ultra-lightweight polypropylene mesh (Ultrapro), one for each side.  I cut a single sigmoid-shaped slit ~6cm from a corner of each mesh.  I placed the meshes into the preperitoneal space & laid them as overlapping diamonds such that at the inferior points, a 6x6 cm corner flap rested in the true anterolateral pelvis, covering the obturator & femoral  foramina.   I allowed the bladder to return to the pubis, this helping tuck the corners of the mesh in the anteriolateral pelvis.  The medial corners overlapped each other across midline cephalad to the pubic rim.   Because of the large direct hernias, I placed a 3rd mesh in the center midline, allowing the lateral corners to overlap over the direct space hernias.  This provided >2 inch coverage around the hernia.  Because the defects well covered and not particularly large, I did not place any tacks.  I held the hernia sacs cephalad & evacuated carbon  dioxide.  I placed a 23mm port by optical entry into the RUQ of the abdomen.  I mobilized the greater omentum it into the upper abdomen.  I performed omentopexy with 2-0 prolene interrupted stitches to the omental edge in bilateral upper quadrants x2 using a laparoscopic Endoclose suture passer.  This kept the omentum from reaching into the infraumbilical abdomen.  I proceeded with placement of the peritoneal dialysis catheter.   I measured the catheter by surface anatomy such that the curl rested just distal to the pubic rim on the surface, marking where the deep cuff lied on the periumbilical skin.  I made an incision at that mark & tunnelled a 135mm long 63mm Applied dilating port obliquely through the abdominal wall from that periumbilical location through the abdominal wall inferiorly, splitting the left infraumbililcal rectus muscle, tunnelling into the preperitoneal space, and exiting into the peritoneum just cephalad to the upper edge of the LLQ mesh.  I placed the CAPD catheter through that port with the help of a stylet.  The curl of the CAPD catheter rested down in the cul de sac of the upper pelvis.   It flushed & aspirated 276mL heparinized saline well.  The deep cuff rested just under the anterior rectus fascia in the rectus muscle just slightly infraumbilical.  The Port was removed to allow the external end to exit the skin of the port site.  I placed another 2-0 prolene suture through the suprapubic region through the mesh and around the hernia dialysis catheter to help gently sling the catheter so that the curl would more rest into the pelvis and not flip back up.  Gently tied that down.  Catheter still flushed and aspirated well.  I measured & cut to appropriate length the double-cuffed extension tubing so that the upper curve was 2cm below the costal ridge at the medial clavicular line.  I connected the upper double-cuff extra long catheter extention tubing to the tunnelled curl catheter using  the [employer] titanium extender connector.  I tunneled the extended catheter using a long vascular tunneler with a blunt bullet-screw-on tip, entering a subcostal wound to the periumbilical wound.  I tunnelled the catheter from the paramedian 51mm port exit site though the abdominal wall out the subcostal wound.  Therefore, the  titanium connector was buried in the left upper quadrant SQ of the abdominal wall by that tunneling.  I left the proximal superficial cuff just inferior to the subcostal wound.  I attached a Faller stylet (MEDIONICS [id number], 50 degree bend) spike-tip tunneler to the end of the extended tubing.  I secured it to the tubing with a prolene tie.  I used the Faller stylet to tunnel the remaining tubing inferiorly and laterally to exit the skin in the left upper quadrant at the mid clavicular line in the LUQ paramedian region.  The most distal cuff rests superior to the final exit  site in the SQ.  I removed the Faller stylet.  No stitches were placed at the exit site.       Hemostasis was excellent.  The catheter flushed and aspirated easily with heparinized saline into the CAPD catheter.  I attached to the [employer] titanium hep lock adapter to the tubing.  I then attached the peritoneal dialysis catheter adapter extension blue cap tubing to that as well.  I removed the ports.  I closed the skin using 4-0 monocryl stitch.  Sterile dressings were applied. The patient was extubated & arrived in the PACU in stable condition..  I had discussed postoperative care with the patient & his daughter in the holding area. The patient was extubated & arrived in the PACU in stable condition.  Instructions are written in the chart.  I discussed operative findings, updated the patient's status, discussed probable steps to recovery, and gave postoperative recommendations to the patient's daughter.  Recommendations were made.  Questions were answered.  She expressed understanding & appreciation.  Instructions and  contact information are written in the chart as well.  The patient will require close followup of the CAPD peritoneal dialysis catheter through the peritoneal dialysis [place] within 24 hours.  Adin Hector, M.D., F.A.C.S. Gastrointestinal and Minimally Invasive Surgery [facility] [address] [place], [place] [address] [phone] Main / Paging   07/07/2016

## 2016-07-07 NOTE — Anesthesia Preprocedure Evaluation (Addendum)
Anesthesia Evaluation  Patient identified by MRN, date of birth, ID band Patient awake    Reviewed: Allergy & Precautions, NPO status , Patient's Chart, lab work & pertinent test results  Airway Mallampati: II  TM Distance: >3 FB Neck ROM: Full    Dental  (+) Edentulous Upper, Edentulous Lower, Dental Advisory Given   Pulmonary shortness of breath,    breath sounds clear to auscultation       Cardiovascular hypertension,  Rhythm:Regular Rate:Normal     Neuro/Psych    GI/Hepatic Neg liver ROS,   Endo/Other  diabetes  Renal/GU ESRF and DialysisRenal disease     Musculoskeletal   Abdominal   Peds  Hematology   Anesthesia Other Findings   Reproductive/Obstetrics                            Anesthesia Physical Anesthesia Plan  ASA: III  Anesthesia Plan: General   Post-op Pain Management:    Induction: Intravenous  Airway Management Planned: Oral ETT  Additional Equipment:   Intra-op Plan:   Post-operative Plan: Extubation in OR  Informed Consent: I have reviewed the patients History and Physical, chart, labs and discussed the procedure including the risks, benefits and alternatives for the proposed anesthesia with the patient or authorized representative who has indicated his/her understanding and acceptance.   Dental advisory given  Plan Discussed with: CRNA  Anesthesia Plan Comments:         Anesthesia Quick Evaluation

## 2016-07-07 NOTE — Anesthesia Postprocedure Evaluation (Signed)
Anesthesia Post Note  Patient: Craig Fisher  Procedure(s) Performed: Procedure(s) (LRB): LAPAROSCOPIC INSERTION CONTINUOUS AMBULATORY PERITONEAL DIALYSIS CATHETER WITH OMENTOPEXY (N/A) REPAIR BILATERAL INGUINAL HERNIAS (Bilateral) INSERTION OF MESH (Bilateral)  Patient location during evaluation: PACU Anesthesia Type: General Level of consciousness: awake and alert Pain management: pain level controlled Vital Signs Assessment: post-procedure vital signs reviewed and stable Respiratory status: spontaneous breathing, nonlabored ventilation, respiratory function stable and patient connected to nasal cannula oxygen Cardiovascular status: blood pressure returned to baseline and stable Postop Assessment: no signs of nausea or vomiting Anesthetic complications: no       Last Vitals:  Vitals:   07/07/16 1145 07/07/16 1200  BP: 112/61 118/61  Pulse: (!) 56 (!) 52  Resp: 16 16  Temp:      Last Pain:  Vitals:   07/07/16 1201  TempSrc:   PainSc: 7                  Tiburcio Linder,JAMES TERRILL

## 2016-07-07 NOTE — Anesthesia Procedure Notes (Signed)
Procedure Name: Intubation Date/Time: 07/07/2016 8:40 AM Performed by: Sampson Si E Pre-anesthesia Checklist: Patient identified, Emergency Drugs available, Suction available and Patient being monitored Patient Re-evaluated:Patient Re-evaluated prior to inductionOxygen Delivery Method: Circle System Utilized Preoxygenation: Pre-oxygenation with 100% oxygen Intubation Type: IV induction Ventilation: Mask ventilation without difficulty Laryngoscope Size: Mac and 3 Grade View: Grade I Tube type: Oral Tube size: 7.5 mm Number of attempts: 1 Airway Equipment and Method: Stylet and Oral airway Placement Confirmation: ETT inserted through vocal cords under direct vision,  positive ETCO2 and breath sounds checked- equal and bilateral Secured at: 22 cm Tube secured with: Tape Dental Injury: Teeth and Oropharynx as per pre-operative assessment

## 2016-07-07 NOTE — Progress Notes (Signed)
Patient does not want to stay over night. Patient's daughter came back to recovery states her dad seems to be getting back to baseline. Patient is more alert, still a little unsteady, and able to tolerate fluids. Patient is going to go home with daughter. MD on call notified of update on patient status.

## 2016-07-07 NOTE — Transfer of Care (Signed)
Immediate Anesthesia Transfer of Care Note  Patient: Candie Echevaria  Procedure(s) Performed: Procedure(s): LAPAROSCOPIC INSERTION CONTINUOUS AMBULATORY PERITONEAL DIALYSIS CATHETER WITH OMENTOPEXY (N/A) REPAIR BILATERAL INGUINAL HERNIAS (Bilateral) INSERTION OF MESH (Bilateral)  Patient Location: PACU  Anesthesia Type:General  Level of Consciousness: awake and patient cooperative  Airway & Oxygen Therapy: Patient Spontanous Breathing and Patient connected to nasal cannula oxygen  Post-op Assessment: Report given to RN  Post vital signs: Reviewed and stable  Last Vitals:  Vitals:   07/07/16 0631  BP: (!) 164/76  Pulse: 71  Resp: 18  Temp: 36.5 C    Last Pain:  Vitals:   07/07/16 0631  TempSrc: Oral      Patients Stated Pain Goal: 3 (52/59/10 2890)  Complications: No apparent anesthesia complications

## 2016-07-07 NOTE — H&P (Signed)
Craig Fisher 04/15/2016 4:28 PM Location: [facility] Patient #: [id number] DOB: 05/18/1961 Married / Language: Spanish / Race: White Male   History of Present Illness  The patient is a 55 year old male who presents with a complaint of CAPD catheter placement. Patient sent for surgical consultation by his nephrologist. Dr. Lowanda Foster. Chronic kidney disease on permanent dialysis. Request for peritoneal dialysis catheter placement  Pleasant Hispanic male. Works in a factory doing light duty now. Had some groin pain and discomfort. Concern for inguinal hernia. Recommendation made for surgical consultation. He tried to hold off. Was found to have significant hypertension. Was found to have kidney disease. Unfortunately progressed and and went into end-stage renal disease. Required urgent hemodialysis. Fistula creation by vascular surgeons in April. Gets hemodialysis through his left upper arm AV fistula on Tuesday Thursday Saturday. Gets this up and eaten. That is help stabilize things, but is getting disrupted with work. He was looking for options. The peritoneal dialysis nurses and his nephrologist recommended consideration of peritoneal dialysis. He claims he is seen some instruction video as well as discussed with the dialysis nurses. He is interested in CAPD.  He speaks [place] pretty well but only had an interpreter on the phone just in case. He has a friend who speaks no [place] with him. Patient promises to bring family member that is fluently bilingual. Patient moves his bowels every day. No severe constipation or diarrhea. Can walk a half hour without difficulty. No fevers or chills. He's never had any abdominal surgery. No history of infections.  No major new events ready for surgery   Other Problems Illene Regulus, CMA; 04/15/2016 4:28 PM) Chronic Renal Failure Syndrome  Diabetes Mellitus  High blood pressure  Hypercholesterolemia  Inguinal  Hernia   Past Surgical History Illene Regulus, CMA; 04/15/2016 4:28 PM) Dialysis Shunt / Fistula   Diagnostic Studies History Lars Mage Spillers, CMA; 04/15/2016 4:28 PM) Colonoscopy  1-5 years ago  Allergies Lars Mage Spillers, CMA; 04/15/2016 4:29 PM) OxyCODONE HCl *ANALGESICS - OPIOID*   Medication History (Alisha Spillers, CMA; 04/15/2016 4:31 PM) AmLODIPine Besylate (10MG  Tablet, Oral) Active. BD Pen Needle Nano U/F (32G X 4 MM Misc,) Active. Lidocaine-Prilocaine (2.5-2.5% Cream, External) Active. HydrALAZINE HCl (50MG  Tablet, Oral) Active. Lantus SoloStar (100UNIT/ML Soln Pen-inj, Subcutaneous) Active. Metoprolol Tartrate (50MG  Tablet, Oral) Active. Ondansetron (4MG  Tablet Disint, Oral) Active. Timolol Maleate (0.5% Solution, Ophthalmic) Active. Norvasc (10MG  Tablet, Oral) Active. Baby Aspirin (81MG  Tablet Chewable, Oral) Active. Vitamin D3 (1000UNIT Capsule, Oral) Active. Medications Reconciled  Social History Illene Regulus, CMA; 04/15/2016 4:28 PM) Alcohol use  Remotely quit alcohol use. Caffeine use  Carbonated beverages, Coffee, Tea. Tobacco use  Never smoker.  Family History Illene Regulus, CMA; 04/15/2016 4:28 PM) Diabetes Mellitus  Brother, Mother.    Review of Systems Lars Mage Spillers CMA; 04/15/2016 4:28 PM) HEENT Present- Wears glasses/contact lenses. Not Present- Earache, Hearing Loss, Hoarseness, Nose Bleed, Oral Ulcers, Ringing in the Ears, Seasonal Allergies, Sinus Pain, Sore Throat, Visual Disturbances and Yellow Eyes. Cardiovascular Present- Leg Cramps. Not Present- Chest Pain, Difficulty Breathing Lying Down, Palpitations, Rapid Heart Rate, Shortness of Breath and Swelling of Extremities. Gastrointestinal Present- Nausea. Not Present- Abdominal Pain, Bloating, Bloody Stool, Change in Bowel Habits, Chronic diarrhea, Constipation, Difficulty Swallowing, Excessive gas, Gets full quickly at meals, Hemorrhoids, Indigestion, Rectal Pain and  Vomiting. Musculoskeletal Present- Back Pain. Not Present- Joint Pain, Joint Stiffness, Muscle Pain, Muscle Weakness and Swelling of Extremities. Neurological Present- Headaches and Trouble walking. Not Present- Decreased Memory, Fainting, Numbness, Seizures,  Tingling, Tremor and Weakness.  Vitals (Alisha Spillers CMA; 04/15/2016 4:29 PM) 04/15/2016 4:29 PM Weight: 134 lb Height: 65in Body Surface Area: 1.67 m Body Mass Index: 22.3 kg/m  Pulse: 74 (Regular)  BP: 134/82 (Sitting, Left Arm, Standard)    BP (!) 164/76   Pulse 71   Temp 97.7 F (36.5 C) (Oral)   Resp 18   Ht 5\' 5"  (1.651 m)   Wt 59.9 kg (132 lb)   SpO2 99%   BMI 21.97 kg/m     Physical Exam Adin Hector MD; 04/15/2016 5:14 PM) General Mental Status-Alert. General Appearance-Not in acute distress, Not Sickly. Orientation-Oriented X3. Hydration-Well hydrated. Voice-Normal.  Integumentary Global Assessment Upon inspection and palpation of skin surfaces of the - Axillae: non-tender, no inflammation or ulceration, no drainage. and Distribution of scalp and body hair is normal. General Characteristics Temperature - normal warmth is noted.  Head and Neck Head-normocephalic, atraumatic with no lesions or palpable masses. Face Global Assessment - atraumatic, no absence of expression. Neck Global Assessment - no abnormal movements, no bruit auscultated on the right, no bruit auscultated on the left, no decreased range of motion, non-tender. Trachea-midline. Thyroid Gland Characteristics - non-tender.  Eye Eyeball - Left-Extraocular movements intact, No Nystagmus. Eyeball - Right-Extraocular movements intact, No Nystagmus. Cornea - Left-No Hazy. Cornea - Right-No Hazy. Sclera/Conjunctiva - Left-No scleral icterus, No Discharge. Sclera/Conjunctiva - Right-No scleral icterus, No Discharge. Pupil - Left-Direct reaction to light normal. Pupil - Right-Direct reaction to  light normal.  ENMT Ears Pinna - Left - no drainage observed, no generalized tenderness observed. Right - no drainage observed, no generalized tenderness observed. Nose and Sinuses External Inspection of the Nose - no destructive lesion observed. Inspection of the nares - Left - quiet respiration. Right - quiet respiration. Mouth and Throat Lips - Upper Lip - no fissures observed, no pallor noted. Lower Lip - no fissures observed, no pallor noted. Nasopharynx - no discharge present. Oral Cavity/Oropharynx - Tongue - no dryness observed. Oral Mucosa - no cyanosis observed. Hypopharynx - no evidence of airway distress observed.  Chest and Lung Exam Inspection Movements - Normal and Symmetrical. Accessory muscles - No use of accessory muscles in breathing. Palpation Palpation of the chest reveals - Non-tender. Auscultation Breath sounds - Normal and Clear.  Cardiovascular Auscultation Rhythm - Regular. Murmurs & Other Heart Sounds - Auscultation of the heart reveals - No Murmurs and No Systolic Clicks.  Abdomen Inspection Inspection of the abdomen reveals - No Visible peristalsis and No Abnormal pulsations. Umbilicus - No Bleeding, No Urine drainage. Palpation/Percussion Palpation and Percussion of the abdomen reveal - Soft, Non Tender, No Rebound tenderness, No Rigidity (guarding) and No Cutaneous hyperesthesia.  Male Genitourinary Sexual Maturity Tanner 5 - Adult hair pattern and Adult penile size and shape. Note: Obvious right groin inguinal hernia. Impulse on left side suspicious for left inguinal hernia as well. Normal external genitalia. Epididymi, testes, and spermatic cords normal without any masses.   Peripheral Vascular Upper Extremity Inspection - Left - No Cyanotic nailbeds, Not Ischemic. Right - No Cyanotic nailbeds, Not Ischemic.  Neurologic Neurologic evaluation reveals -normal attention span and ability to concentrate, able to name objects and repeat phrases.  Appropriate fund of knowledge , normal sensation and normal coordination. Mental Status Affect - not angry, not paranoid. Cranial Nerves-Normal Bilaterally. Gait-Normal.  Neuropsychiatric Mental status exam performed with findings of-able to articulate well with normal speech/language, rate, volume and coherence, thought content normal with ability to perform basic computations  and apply abstract reasoning and no evidence of hallucinations, delusions, obsessions or homicidal/suicidal ideation.  Musculoskeletal Global Assessment Spine, Ribs and Pelvis - no instability, subluxation or laxity. Right Upper Extremity - no instability, subluxation or laxity.  Lymphatic Head & Neck  General Head & Neck Lymphatics: Bilateral - Description - No Localized lymphadenopathy. Axillary  General Axillary Region: Bilateral - Description - No Localized lymphadenopathy. Femoral & Inguinal  Generalized Femoral & Inguinal Lymphatics: Left - Description - No Localized lymphadenopathy. Right - Description - No Localized lymphadenopathy.    Assessment & Plan  END-STAGE RENAL DISEASE NEEDING DIALYSIS (N18.6) Impression: He is stabilized on hemodialysis. He is interested in peritoneal dialysis since he can do it at nighttime and still be able to work. Hemodialysis 3 times a week is getting disruptive. Need to fix his hernias as well. I did caution him that sometimes CAPD dialysis catheters never work right & need to be removed. They can get infected or malpositioned. I will try to minimize but cannot eliminate totally the risks. He is interested in proceeding. We'll work to do this at a convenient time.  Because he gets dialysis on Tuesday Thursday Saturday, plan to do an off dialysis day = MWF. [place].  He has seen the video and had some preliminary trailing at his dialysis center up in [place]. I strongly recommend he keep them informed as they are his lifeline after placement of the catheter. This  discussion was made with the help of a phone interpreter in the office. Patient felt very comfortable discussing most of this in [place] anyway. Wished to be safe.  I have re-reviewed the the patient's records, history, medications, and allergies.  I have re-examined the patient.  I again discussed intraoperative plans and goals of post-operative recovery.  The patient agrees to proceed.  Craig Fisher  1960-10-08 [id number]  Patient Care Team: Sharion Balloon, FNP as PCP - General (Nurse Practitioner) Fran Lowes, MD as Consulting Physician (Nephrology) Michael Boston, MD as Consulting Physician (General Surgery)  Patient Active Problem List   Diagnosis Date Noted  . ESRD on dialysis ([place]) 12/05/2015  . Chronic kidney disease, stage V ([place]) 10/21/2015  . Chronic kidney disease (CKD) stage G4/A1, severely decreased glomerular filtration rate (GFR) between 15-29 mL/min/1.73 square meter and albuminuria creatinine ratio less than 30 mg/g ([facility]) 04/07/2015  . Edema 08/20/2014  . Diabetes mellitus type 2 with complications, uncontrolled ([place]) 05/06/2014  . Right inguinal hernia 01/23/2014  . Diabetic retinopathy ([place]) 03/30/2013  . HTN (hypertension) 12/09/2012  . HLD (hyperlipidemia) 12/09/2012  . Helicobacter pylori antibody positive 12/09/2012    Past Medical History:  Diagnosis Date  . Anemia   . Blind left eye   . Complication of anesthesia    Palpitations at dentist  . Diabetes mellitus without complication ([facility])    Type II  . Diabetic retinopathy ([place])   . ESRD (end stage renal disease) ([place])    Hemodialysis TTHS  . Full dentures   . Headache   . Hyperlipidemia   . Hypertension   . Shortness of breath dyspnea    " only once after surgery, when going to bed" 07/06/16, patient denies shortness of breath  . Wears glasses     Past Surgical History:  Procedure Laterality Date  . AV FISTULA PLACEMENT Left 10/29/2015   Procedure: BRACHIOCEPHALIC ARTERIOVENOUS (AV) FISTULA  CREATION;  Surgeon: Mal Misty, MD;  Location: [facility];  Service: Vascular;  Laterality: Left;  . COLONOSCOPY  One small polyp  . EYE SURGERY     both eyes-retine  . eyes     detached retina-lt  . INSERTION OF DIALYSIS CATHETER N/A 11/07/2015   Procedure: INSERTION OF DIALYSIS CATHETER;  Surgeon: Elam Dutch, MD;  Location: [facility];  Service: Vascular;  Laterality: N/A;  . IR GENERIC HISTORICAL  02/20/2016   IR REMOVAL TUN CV CATH W/O FL 02/20/2016 Corrie Mckusick, DO [facility]-INTERV RAD    Social History   Social History  . Marital status: Married    Spouse name: N/A  . Number of children: N/A  . Years of education: N/A   Occupational History  . Not on file.   Social History Main Topics  . Smoking status: Never Smoker  . Smokeless tobacco: Never Used  . Alcohol use No  . Drug use: No  . Sexual activity: Yes    Birth control/ protection: Condom   Other Topics Concern  . Not on file   Social History Narrative   Mostly bilingual but more comfortable speaking Spanish    Family History  Problem Relation Age of Onset  . Diabetes Mother   . Diabetes Sister   . Esophageal cancer Neg Hx   . Colon cancer Neg Hx   . Rectal cancer Neg Hx   . Stomach cancer Neg Hx     Current Facility-Administered Medications  Medication Dose Route Frequency Provider Last Rate Last Dose  . acetaminophen (TYLENOL) tablet 1,000 mg  1,000 mg Oral On Call to [place], MD      . ceFAZolin (ANCEF) 2-4 GM/100ML-% IVPB           . ceFAZolin (ANCEF) IVPB 2g/100 mL premix  2 g Intravenous On Call to OR Michael Boston, MD      . Chlorhexidine Gluconate Cloth 2 % PADS 6 each  6 each Topical Once Michael Boston, MD       And  . Chlorhexidine Gluconate Cloth 2 % PADS 6 each  6 each Topical Once Michael Boston, MD      . gabapentin (NEURONTIN) 300 MG capsule           . metoprolol (LOPRESSOR) 50 MG tablet              No Active Allergies  BP (!) 164/76   Pulse 71   Temp 97.7 F (36.5 C) (Oral)    Resp 18   Ht 5\' 5"  (1.651 m)   Wt 59.9 kg (132 lb)   SpO2 99%   BMI 21.97 kg/m   Labs: Results for orders placed or performed during the hospital encounter of 07/07/16 (from the past 48 hour(s))  Glucose, capillary     Status: None   Collection Time: 07/07/16  6:35 AM  Result Value Ref Range   Glucose-Capillary 79 65 - 99 mg/dL   Comment 1 Notify RN    Comment 2 Document in Chart   I-STAT 4, (NA,K, GLUC, HGB,HCT)     Status: Abnormal   Collection Time: 07/07/16  6:53 AM  Result Value Ref Range   Sodium 135 135 - 145 mmol/L   Potassium 3.6 3.5 - 5.1 mmol/L   Glucose, Bld 86 65 - 99 mg/dL   HCT 32.0 (L) 39.0 - 52.0 %   Hemoglobin 10.9 (L) 13.0 - 17.0 g/dL    Imaging / Studies: No results found.   Adin Hector, M.D., F.A.C.S. Gastrointestinal and Minimally Invasive Surgery [facility] [phone]  [place], [place] [address] [phone] Main / Paging  07/07/2016 7:32 AM    Current Plans Pt Education - [facility] Peritoneal Dialysis catheter placememt - CAPD The anatomy & physiology of peritoneum was discussed. Natural history risks without surgery of worsening renal failure was discussed. I feel the risks of no intervention will lead to serious problems that outweigh the operative risks; therefore, I recommended placement of a peritoneal dialysis catheter. I explained laparoscopic techniques with possible need for an open approach.  Risks such as bleeding, infection, abscess, injury to other organs, catheter occlusion or malpositioning, reoperation to remove/reposition the catheter, heart attack, death, and other risks were discussed. I noted a good likelihood this will help address the problem. Possibility that this will not be enough to compensate for the renal failure & need for further treatment such as hemodialysis was explained. Goals of post-operative recovery were discussed as well. We will work to minimize complications.  The  patient is/will be getting training on catheter use by dialysis nursing before and after surgery. I stressed the importance of meticulous care & sterile technique to prevent catheter problems. Questions were answered. The patient expresses understanding & wishes to proceed with surgery.  BILATERAL INGUINAL HERNIA WITHOUT OBSTRUCTION OR GANGRENE, RECURRENCE NOT SPECIFIED (K40.20) Impression: Definite right and probable left inguinal hernias. Would repair these 2 as they will definitely worsen over time, especially in the setting of peritoneal dialysis. Reasonable to do laparoscopically.  We'll try and place mesh appropriately and hopefully can get the peritoneal dialysis catheter in a good position as well without disrupting the mesh. May be a challenge with the bilateral hernias. We will see.  PREOP - ING HERNIA - ENCOUNTER FOR PREOPERATIVE EXAMINATION FOR GENERAL SURGICAL PROCEDURE (Z01.818) Current Plans You are being scheduled for surgery - Our schedulers will call you.  You should hear from our office's scheduling department within 5 working days about the location, date, and time of surgery. We try to make accommodations for patient's preferences in scheduling surgery, but sometimes the OR schedule or the surgeon's schedule prevents [place] from making those accommodations.  If you have not heard from our office [phone]) in 5 working days, call the office and ask for your surgeon's nurse.  If you have other questions about your diagnosis, plan, or surgery, call the office and ask for your surgeon's nurse.  Written instructions provided The anatomy & physiology of the abdominal wall and pelvic floor was discussed. The pathophysiology of hernias in the inguinal and pelvic region was discussed. Natural history risks such as progressive enlargement, pain, incarceration, and strangulation was discussed. Contributors to complications such as smoking, obesity, diabetes, prior surgery, etc were  discussed.  I feel the risks of no intervention will lead to serious problems that outweigh the operative risks; therefore, I recommended surgery to reduce and repair the hernia. I explained laparoscopic techniques with possible need for an open approach. I noted usual use of mesh to patch and/or buttress hernia repair  Risks such as bleeding, infection, abscess, need for further treatment, heart attack, death, and other risks were discussed. I noted a good likelihood this will help address the problem. Goals of post-operative recovery were discussed as well. Possibility that this will not correct all symptoms was explained. I stressed the importance of low-impact activity, aggressive pain control, avoiding constipation, & not pushing through pain to minimize risk of post-operative chronic pain or injury. Possibility of reherniation was discussed. We will work to minimize complications.  An educational handout further explaining  the pathology & treatment options was given as well. Questions were answered. The patient expresses understanding & wishes to proceed with surgery.  Pt Education - Pamphlet Given - Laparoscopic Hernia Repair: discussed with patient and provided information. Pt Education - CCS Pain Control (Micha Erck) Pt Education - [facility] Hernia Post-Op HCI (Lillianah Swartzentruber): discussed with patient and provided information.  Adin Hector, M.D., F.A.C.S. Gastrointestinal and Minimally Invasive Surgery [facility] [address] [place], [place] [address] [phone] Main / Paging

## 2016-07-07 NOTE — Interval H&P Note (Signed)
History and Physical Interval Note:  07/07/2016 8:23 AM  Craig Fisher  has presented today for surgery, with the diagnosis of BILATERAL INGUINAL HERNIAS, END STAGE RENAL DISEASE.  The various methods of treatment have been discussed with the patient and family. After consideration of risks, benefits and other options for treatment, the patient has consented to  Procedure(s): [employer] (N/A) REPAIR BILATERAL INGUINAL HERNIAS (Bilateral) INSERTION OF MESH (Bilateral) as a surgical intervention .  The patient's history has been reviewed, patient examined, no change in status, stable for surgery.  I have reviewed the patient's chart and labs.  Questions were answered to the patient's satisfaction.     Chalisa Kobler C.

## 2016-07-07 NOTE — Discharge Instructions (Signed)
COLOCACIN DEL CATTER DE LA DILISIS PERITONEAL (CAPD):  INSTRUCCIONES POST OPERATORIAS  SEGUIMIENTO con las enfermeras de dilisis peritoneal [address], [place], [place] [address]  Llame al [phone] o a su nefrlogo para ayudar a Forensic scientist / las descargas de su catter CAPD  Las enfermeras de CAPD y la nefrologa generalmente lo siguen de cerca, haciendo que la necesidad de seguimiento en la oficina de [place] de [facility] sea redundante y, por lo tanto, no siempre necesaria. Si ellos o usted tienen dudas, llmenos para un posible seguimiento en nuestra oficina  NO se bae hasta que el personal de enfermera de dilisis le haya aconsejado. NO sumergir en una baera o jacuzzi. Su Home Therapy RN Radio broadcast assistant y [employer] la ducha, [place] bao y la natacin cuando est en entrenamiento. La enfermera de dilisis peritoneal le quitar los vendajes impermeables en el Centro de dilisis unos das despus de la [place]. No quite las vendas [employer] vea. Si su vendaje se humedece, se satura o se cae, llame a su Home Therapy RN. ACTIVIDADES segn la tolerancia: Puede reanudar las actividades diarias regulares ([place]) a Proofreader del da siguiente, como el cuidado personal diario, caminar, Pensions consultant, y aumentar gradualmente las actividades segn lo [place]. Si puede caminar 30 minutos sin dificultad, es seguro intentar actividades ms intensas como correr, Catering manager, andar en bicicleta, aerbicos de bajo impacto, etc. No nade dentro del primer mes de colocacin del catter. Debe tener una orden del Dr. Tami Ribas la actividad ms intensa y extenuante para el final, como abdominales, levantamiento de pesas, deportes de contacto, etc. Evite levantar objetos pesados ??o esforzarse hasta que no tenga narcticos para Financial controller. NO PULSE A TRAVS DEL DOLOR. Deje que el dolor sea su gua: si le duele hacer algo, no lo haga. El dolor es su cuerpo advirtindole que evite esa  actividad durante otra semana hasta que el dolor disminuya. Puede conducir cuando ya no est tomando medicamentos recetados para Conservation officer, historic buildings, puede usar cmodamente el cinturn de seguridad y Warehouse manager con seguridad el automvil y [employer] frenos. Puede tener relaciones sexuales cuando es cmodo. Asegrese de que su catter est pegado a su abdomen y no se lastime. No se permite el catter en el tobillo ni la tensin en el catter, ya que puede daar la piel del catter Se le indicar qu hacer en caso de emergencia y tendr acceso a nuestro servicio las 24 horas del da. El nmero para llegar a la enfermera de terapia domiciliaria como se detalla a continuacin. DIETA: Siga una dieta ligera e inspida las primeras 24 horas despus de su llegada a casa, como sopa, lquidos, galletas saladas, etc. Asegrese de incluir muchos lquidos diariamente. Evite la comida rpida o comidas pesadas ya que es ms probable que tenga nuseas. Tome sus medicamentos recetados para Engineer, mining, a menos que se indique lo contrario. CONTROL DE DOLOR: El dolor se controla mejor mediante una combinacin habitual de tres mtodos diferentes JUNTOS: Hielo / Calor Tylenol (medicamento para el dolor de Radio broadcast assistant) Medicamento para Conservation officer, historic buildings con receta La mayora de los pacientes experimentar algo de hinchazn y hematomas alrededor de las incisiones. Paquetes de hielo o almohadillas trmicas (30-60 minutos hasta 6 veces unaun da) ayudar. Use hielo durante los primeros das para ayudar a disminuir la hinchazn y los hematomas, luego cambie al calor para ayudar a Media planner las zonas apretadas / doloridas y Engineer, water. Algunas Academic librarian solo hielo, Materials engineer  solo, alternando entre el hielo y Nurse, children's. Experimenta lo que funciona para ti. La hinchazn y los hematomas pueden tardar varias semanas en resolverse. Es til Merchandiser, retail de venta libre regularmente durante las primeras semanas. El uso de  paracetamol (Tylenol, etc.) 500-650 mg cuatro veces al da (todas las comidas y la hora de Acupuncturist) suele ser ms seguro ya que los [place] no son aconsejables en pacientes con enfermedad renal. Se le debe dar una receta para medicamentos para el dolor (como oxicodona, hidrocodona, etc.) al momento del alta. Tome su analgsico como se lo Building control surveyor. Si tiene problemas / inquietudes con el medicamento recetado (no controla el dolor, las nuseas, los vmitos, el sarpullido, la picazn, Social research officer, government.), llmenos al [phone] para ver si debemos cambiarlo a un medicamento para Nature conservation officer. eso funcionar mejor para usted y / o Chief Technology Officer mejor su efecto secundario. Si necesita volver a surtir Nurse, mental health para Conservation officer, historic buildings, comunquese con su farmacia. Se contactarn con nuestra oficina para solicitar autorizacin. Las recetas no se llenarn despus de las 5 p. M. O los fines de [place]. Evite estreirse. Entre la [place] y los medicamentos para Conservation officer, historic buildings, es comn experimentar algo de estreimiento. Aumentar la ingesta de lquidos y tomar un suplemento de fibra (como Metamucil, Citrucel, FiberCon, [place], etc.) 1-2 veces al da regularmente generalmente ayudarn a prevenir este problema. Se debe tomar un laxante suave (jugo de ciruela pasa, Tillar de magnesia, [place], etc.) de acuerdo con las instrucciones del paquete si no hay deposiciones despus de 48 horas.      SEGUIRNorval Gable de dilisis peritoneal [address] [place], [place] [address]  Llame al [phone] o a su nefrlogo para ayudar a Forensic scientist / las descargas de su catter CAPD  -Las enfermeras de CAPD y la nefrologa generalmente lo siguen de cerca, lo que hace que la necesidad de seguimiento en nuestra oficina sea redundante y, por lo [place], innecesaria. Si ellos o usted tienen dudas, llmenos para un posible seguimiento en nuestra oficina  -Por favor llame a [facility] al ([phone]) [phone] solo si es necesario.  CUANDO  LLAMARNOS AL ([phone]) [phone]: Pobre control del dolor Reacciones / problemas con medicamentos nuevos (sarpullido / picazn, nuseas, etc.) [employer] 101.5 F (38.5 C) Empeoramiento de la hinchazn o moretones Contina sangrando de la incisin. Aumento del Social research officer, government, enrojecimiento o drenaje de la incisin  El personal de la clnica est disponible para responder sus preguntas durante el horario comercial habitual (8:30 a. M. A 5 p. M.). No dude en llamar y solicitar hablar con Ardelia Mems de nuestras enfermeras por cuestiones clnicas. Si tiene [employer], vaya a la sala de emergencias ms cercana o llame al 911. Un cirujano de [facility] est siempre de guardia en los hospitales  9. [place], [place] A LA OFICINA PARA [place]. NO LE D ELLOS A SU MDICO.  [facility], [place] [address]    PERITONEAL DIALYSIS (CAPD) CATHETER PLACEMENT:  POST OPERATIVE INSTRUCTIONS  FOLLOW UP with the Peritoneal Dialysis Nurses  [address], [place], [place] [address]  Call [phone] or your neprologist to help arrange training/flushes of your CAPD catheter   The CAPD nurses & Nephrology usually follow you closely, making the need for follow-up in the [facility] surgery office redundant and therefore not always needed.  If they or you have concerns, please call us for possible follow-up in our office  1. Do NOT shower until dialysis  nursing staff have advised.  Do NOT submerge in a bathtub or hot tub. 2. Your Home Therapy RN will advise and educate you on showering, bathing, and swimming when you are in training. 3. The Peritoneal Dialysis nurse will remove your waterproof bandages in the Dialysis Center a few days after surgery.  Do not remove the bandages until seen by them.  If you dressing becomes wet, saturated, or falls off call your Home Therapy RN. 4. ACTIVITIES as tolerated:   a. You may resume regular (light) daily activities  beginning the next day--such as daily self-care, walking, climbing stairs--gradually increasing activities as tolerated.  If you can walk 30 minutes without difficulty, it is safe to try more intense activity such as jogging, treadmill, bicycling, low-impact aerobics,  etc. b. No swimming within the 1st month of catheter placement.  You must have a Dr's order. c. Save the most intensive and strenuous activity for last such as sit-ups, heavy lifting, contact sports, etc  Refrain from any heavy lifting or straining until you are off narcotics for pain control.   d. DO NOT PUSH THROUGH PAIN.  Let pain be your guide: If it hurts to do something, don't do it.  Pain is your body warning you to avoid that activity for another week until the pain goes down. e. You may drive when you are no longer taking prescription pain medication, you can comfortably wear a seatbelt, and you can safely maneuver your car and apply brakes. f. You may have sexual intercourse when it is comfortable.  g. Be sure your catheter is taped to Your abdomen nor injured. Did not allow catheter to ankle, or tension on the catheter-it may cause damage to skin over the catheter h. You will be instructed on what to do an emergency, and will have access to on call our in 24 hours a day. The number to reach the home therapy nurse as listed below. 5. DIET: Follow a light bland diet the first 24 hours after arrival home, such as soup, liquids, crackers, etc.  Be sure to include lots of fluids daily.  Avoid fast food or heavy meals as your are more likely to get nauseated.   6. Take your usually prescribed home medications unless otherwise directed. 7. PAIN CONTROL: a. Pain is best controlled by a usual combination of three different methods TOGETHER: i. Ice/Heat ii. Tylenol (over the counter pain medication) iii. Prescription pain medication b. Most patients will experience some swelling and bruising around the incisions.  Ice packs or heating  pads (30-60 minutes up to 6 times a day) will help. Use ice for the first few days to help decrease swelling and bruising, then switch to heat to help relax tight/sore spots and speed recovery.  Some people prefer to use ice alone, heat alone, alternating between ice & heat.  Experiment to what works for you.  Swelling and bruising can take several weeks to resolve.   c. It is helpful to take an over-the-counter pain medication regularly for the first few weeks.  Using acetaminophen (Tylenol, etc) 500-650mg  four times a day (every meal & bedtime) is usually safest since NSAIDs are not advisable in patients with kidney disease. d. A  prescription for pain medication (such as oxycodone, hydrocodone, etc) should be given to you upon discharge.  Take your pain medication as prescribed.  i. If you are having problems/concerns with the prescription medicine (does not control pain, nausea, vomiting, rash, itching, etc), please call us [phone]  to see if we need to switch you to a different pain medicine that will work better for you and/or control your side effect better. ii. If you need a refill on your pain medication, please contact your pharmacy.  They will contact our office to request authorization. Prescriptions will not be filled after 5 pm or on week-ends. 8. Avoid getting constipated.  Between the surgery and the pain medications, it is common to experience some constipation.  Increasing fluid intake and taking a fiber supplement (such as Metamucil, Citrucel, FiberCon, MiraLax, etc) 1-2 times a day regularly will usually help prevent this problem from occurring.  A mild laxative (prune juice, Milk of Magnesia, MiraLax, etc) should be taken according to package directions if there are no bowel movements after 48 hours.       FOLLOW UP: Peritoneal Dialysis nurses  [address], [place], [place] [address]  Call [phone] or your neprologist to help arrange training/flushes of your CAPD  catheter    -The CAPD nurses & Nephrology usually follow you closely, making the need for follow-up in our office redundant and therefore not needed.  If they or you have concerns, please call us for possible follow-up in our office   -Please call [facility] at [phone] only as needed.  WHEN TO CALL us [phone]: 1. Poor pain control 2. Reactions / problems with new medications (rash/itching, nausea, etc)  3. Fever over 101.5 F (38.5 C) 4. Worsening swelling or bruising 5. Continued bleeding from incision. 6. Increased pain, redness, or drainage from the incision   The clinic staff is available to answer your questions during regular business hours (8:30am-5pm).  Please dont hesitate to call and ask to speak to one of our nurses for clinical concerns.   If you have a medical emergency, go to the nearest emergency room or call 911.  A surgeon from [facility] is always on call at the hospitals  9. IF YOU HAVE DISABILITY OR FAMILY LEAVE FORMS, BRING THEM TO THE OFFICE FOR PROCESSING.  DO NOT GIVE THEM TO YOUR DOCTOR.  [facility], [place], [place], [place], [place]  [address] ? MAIN: [phone] ? TOLL FREE: [phone] ?  FAX [phone]) [phone] [email or web address]  Peritoneal Dialysis - An Overview Dialysis can be done using a machine outside of the body (hemodialysis). Or, it can be done inside the body (peritoneal dialysis). The word "peritoneal" refers to the lining or membrane of the belly (abdominal cavity). The peritoneal membrane is a thin, plastic-like lining inside the belly that covers the organs and fits in the abdominal or peritoneal cavity, such as the stomach, liver and the kidneys. This lining works like a filter. It will allow certain things to pass from your blood through the lining and into a special solution that has been placed into your belly. In this type of dialysis, the peritoneum is used to help clean the  blood.  If you need dialysis, your kidneys are not working right. Healthy kidneys take out extra water and waste products, which becomes urine. When the kidneys do not do this, serious problems can develop. The waste and water build up in the blood. Your hands and feet might swell. You may feel tired, weak or sick to your stomach. Also, your blood pressure may rise. If not treated, you could die. Dialysis is a treatment that does the work that your kidneys would do if they were healthy.  It cleans your blood.  It will make sure your body has the right amount of certain chemicals that it needs. They include potassium, sodium and bicarbonate.   It will help control your blood pressure.  UNDERSTANDING PERITONEAL DIALYSIS  Here is how peritoneal dialysis works:   First, you will have surgery to put a soft plastic tube (catheter) into your belly (abdomen). This will allow you to easily connect yourself to special tubing, which will then let a special dialysis solution to be placed into your abdomen.   For each treatment, you will need at least one bag of dialysis solution (a liquid called dialysate). It is a mix of water that is pure and free of germs (sterile), sugar (dextrose) and the nutrients and minerals found in your blood. Sometimes, more than one bag is needed to get the right amount of fluid for your abdomen. Your caregiver will explain what size and how many bags you will need.   The dialysate is slowly put through the catheter to fill the abdomen (called the peritoneal cavity). This dialysate will need to stay in your body for 3-4 hours. This is known as the dwell time.   The solution is working to clean the blood and remove wastes from your body. At the end of this time, the solution is drained from your body through tubing into an empty bag. It is then replaced with a fresh dialysate.   The draining and replacing of the dialysate is called an exchange or cycle. The catheter is capped  after each exchange. Once the solution is in your body, you are then free to do whatever you would like until the next exchange. Most people will need to do 4-5 exchanges each day.   There are two different methods that can be used.   Continuous ambulatory peritoneal dialysis (CAPD): You put the solution into your abdomen, cap your catheter and then go about your day. Several hours later, you reconnect to a tubing set up, drain out the solution and then put more solution in. This is done several times a day. No machine is needed.   Continuous cycler-assisted peritoneal dialysis (CCPD): A machine is used, which fills the abdomen with dialysate and then drains it. This happens several times. It usually is done at night while you are sleeping. When you wake up, you can disconnect from the machine and are free to go to go about your day.  PREPARING FOR EXCHANGES  Discuss the details of the procedure with your caregivers. You will be working with a nurse who is specially trained in doing dialysis. Make sure you understand:   How to do an exchange.   How much solution you need.   What type of solution you will need.   How often you should do an exchange. Ask:   How many times each day?   When? At meals? At bedtime?   Always keep the dialysate bags and other supplies in a cool, clean and dry place.   Keeping everything clean is very important.   The catheter and its cap must be free from germs (sterile)   The adapter also must be sterile. It attaches the dialysis bag and tubing to the catheter.   Clean the area of your body around the catheter every day. Use a chemical that fights infection (antiseptic).   Wash your hands thoroughly before starting an exchange.   You may be taught to wear a mask to cover your nose and mouth. This makes infection less likely to  happen.   You may be taught to close doors, windows and turn off any fans before doing an exchange.   Check the dialysate bag  very carefully.   Make sure it is the right size bag for you. This information is on the label.   Also, make sure it is the right mixture. For some people, the dialysate contents vary. For instance, the mixture might be a stronger solution for overnight.   Check the expiration date (the last date you can use the bag). It also is on the label. If the date has gone by, throw away the bag.   The solution should be clear. You should be able to see any writing on the side of the bag clearly through the solution. Do not use a cloudy solution.   Gently squeeze the bag to make sure there are no leaks.   Use a dry heating pad to warm the dialysate in the bag. Leave the cover on the bag while you do this.   This is for comfort. You can skip this step if you want.   Never place the bag of solution under warm or hot water. Water from a faucet is not sterile and could cause germs to get into the bag. Infection could then result.  PERFORMING AN EXCHANGE  For continuous ambulatory dialysis:   Attach the dialysis bag and tubing to your catheter. Hang the bag so that gravity (the natural downward pull) draws the solution down and into your abdomen once the clamps are opened. This should take about 10 minutes.   Remove the bag and tubing from the catheter. Cap the catheter.   The solution stays in the abdomen for 3-4 hours (dwell time). The solution is working to clean the blood and remove wastes from your body.   When you are ready to drain the solution for another exchange, take the cap off the catheter. Then, attach the catheter to tubing, which is attached to an empty bag. Place this empty bag below the abdomen or on the floor or stool and undo the clamps.   Gravity helps pull the fluid out of the abdomen and into the bag. The fluid in the bag may look yellow and clear, like urine. It usually takes about 20 minutes to drain the fluid out of the abdomen.   When the solution has drained, start the  process again by infusing a new bag of dialysate and then capping the catheter.   This should continue until you have used all of the solution that you are to use each day.   Sometimes, a small machine is used overnight. It is called a mini-cycler. This is done if the body cannot go all night without an exchange. The machine lets you sleep without having to get up and do an exchange.   For continuous cycler-assisted dialysis:   You will be taught how to set up or program your machine.   When you are ready for bed, put the dialysate bags onto the cycler machine. Put on exactly the number of bags that your caregiver said to use.   Connect your catheter to the machine and turn the cycler machine on.   Overnight, the cycler will do several exchanges. It often does three to five, sometimes more.   Solution that is in your abdomen in the morning will stay during the day. The machine is set to make the daytime solution stronger, if that is needed.   In the morning, you  will disconnect from the machine and cap your catheter and go about your day.   Sometimes, an extra exchange is done during the day. This may be needed to remove excess waste or fluid.  IMPORTANT REMINDERS  You will need to follow a very strict schedule. Every step of the dialysis procedure must be done every day. Sometimes, several times a day. Altogether, this might take an extra 2 hours or more. However, you must stick to the routine. Do not skip a day. Do not skip a procedure.   Some people find it helpful to work with a Social worker or Education officer, museum in addition to the renal (kidney) nurse. They can help you figure out how to change your daily routine to fit in the dialysis sessions.   You may need to change your diet. Ask your caregiver for advice, or talk with a nutritionist about what you should and should not eat.   You will need to weigh yourself every day and keep track of what your weight is.   You may be taught how to  check your blood pressure before every exchange. Your blood pressure reading will help determine what type of solution to use. If your blood pressure is too high, you may need a stronger solution.  RISKS AND COMPLICATIONS  Possible problems vary, depending on the method you use. Your overall health also can have an effect. Problems that could develop because of dialysis include:  Infection. This is the most common problem. It could occur:   In the peritoneum. This is called peritonitis.   Around the catheter.   Weight gain. The dialysate contains a type of sugar known as dextrose. Dextrose has a lot of calories. The body takes in several hundred calories from this sugar each day.   Weakened muscles in the abdomen. This can result from all of the fluid that your body has to hold in the abdomen.   Catheter replacement. Sometimes, a new one has to be put in.   Change in dialysis method. Due to some complications, you may need to change to hemodialysis for a short time and have your dialysis done at a center.   Trouble adjusting to your new lifestyle. In some people, this leads to depression.   Sleep problems.   Dialysis-related amyloidosis. This sometimes occurs after 5 years of dialysis. Protein builds up in the blood. This can cause painful deposits on bones, joints and tendons (which connect muscle to bone). Or, it can cause hollow spots in bones that make them more likely to break.   Excess fluid. Your body may absorb too much of the fluid that is held in the abdomen. This can lead to heart or lung problems.  SEEK MEDICAL CARE IF:   You have any problems with an exchange.   The area around the catheter becomes red or painful.   The catheter seems loose, or it feels like it is coming out.   A bag of dialysate looks cloudy. Or, the liquid is an unusual color.   Abdominal pain or discomfort.   You feel sick to your stomach (nauseous) or throw up (vomit).   You develop a fever of  more than 102 F (38.9 C).  SEEK IMMEDIATE MEDICAL CARE IF:  You develop a fever of more than 102 F (38.9 C). Document Released: 05/02/2009 Document Revised: 06/24/2011 Document Reviewed: 05/02/2009 [facility] Patient Information 2012 [place].  Diet for Peritoneal Dialysis This diet may be modified in protein, sodium, phosphorus, potassium,  or fluid, depending on your needs. The goals of nutrition therapy are similar to those for patients on hemodialysis. Providing enough protein to replace peritoneal losses is a priority. USES OF THIS DIET The diet is designed for the patient with end-stage kidney (renal) disease, who is treated by peritoneal dialysis. Treatment options include:  Continuous Ambulatory Peritoneal Dialysis (CAPD): Usually 4 exchanges of 1.5 to 2 liter volumes of glucose (sugar) and electrolyte-containing dialysate.   Continuous Cyclic Peritoneal Dialysis (CCPD): Essentially a reversal of CAPD, with shorter exchanges at night and a longer one during the day.   Intermittent Peritoneal Dialysis (IPD): 10 to 12 hours of exchanges, 2 to 3 times weekly.  ADEQUACY The diet may not meet the Recommended Dietary Allowances of the [employer] for calcium and ascorbic acid. Protein and water-soluble vitamin needs may be increased because of losses into the dialysate. Recommended daily supplements are the same as for hemodialysis patients. ASSESSMENT/DETERMINATION OF DIET Dietary needs will differ between patients. Parameters must be individualized. Protein  Guidelines: 1.2 to 1.3 gm/kg/day OR 1.5 gm/kg/day if patient is malnourished, catabolic, or has a protracted episode of peritonitis. A minimum of 50% of the protein intake should be of high biological value.   Goals: Meet protein requirements and replace dialysate losses while avoiding excessive accumulation of waste products. Achieve serum albumin greater than 3.5 g/dL.   Evaluate: Current nutritional status,  serum albumin and BUN levels, presence of peritonitis.  Sodium  Guidelines: Usually 90 to 175 mEq (2000 to 4000 mg), but should be individualized.   Goals: Minimize complications of fluid imbalance.   Evaluate: Weight, blood pressure regulation, and presence of swelling (edema).  Potassium  Guidelines: Individualized; often not restricted, and may need to be supplemented.   Goals: Serum K+ levels between 4.0 to 5.0 mEq/L.   Evaluate: Serum K+ levels, usual intake of K+, appetite.  Phosphorus  Guidelines: 800 to 1200 mg/day (the high protein intake results in a high obligatory P intake).   Goal: Serum P levels between 4.5 to 6.0 mg/dL.   Evaluate: Serum P levels, usual P intake, P-binding medications: type, number, dosage, distribution.  Fluids  Guidelines: Individualized - may not be restricted for all patients.   Goal: Minimize complications of fluid imbalance.   Evaluate: Weight, blood pressure regulation, sodium intake, and presence of edema.  Document Released: 07/05/2005 Document Revised: 06/24/2011 Document Reviewed: 09/27/2006 [facility] Patient Information 2012 [place].  Peritoneal Dialysis (CAPD) Catheter  Healthy kidneys remove excess water and waste products from the body in the form of urine. When the kidneys cannot do this, serious problems develop and a person can fall into kidney failure.   In most cases, the kidneys can heal and recover to a better place.  Sometimes the kidneys remain somewhat damaged and a kidney specialist (nephrologist) needs to help manage the disease with medications and adjustments in diet.    Sometimes the kidney failure has progressed too far.  The bodys waste and fluid build up in the blood. Hands and legs swell. You start to feel more weak or nauseated. Your blood pressure may rise, to seeing you at serious risk for heart attack and stroke.  If not treated, you will eventually die. Dialysis is a treatment that replaces the work  that your kidneys would do if they were healthy to help keep you alive.  Dialysis can be done using a machine outside of the body (hemodialysis) connected to your blood; or, it can be done with a  tube placed to enter your abdomen (peritoneal dialysis). The peritoneum is the inner lining of your abdominal cavity, covering the inner organs & abdominal wall.  This inner lining of peritoneum works like a filter.   Peritoneal dialysis involves placed several quarts of sterile fluid into the inner abdomen/peritoneal cavity.  The waste products and fluids can exchange across the peritoneal membrane.  The fluid is later removed.  This most poor every day.  Sometimes the catheter can be attached to a machine that runs at night while you sleep (continuous cycler-assisted dialysis = CCPD).  Sometimes the fluid can be infused in her abdomen and then later removed hours later (continuous ambulatory peritoneal dialysis = CAPD).  If your nephrologist feels like you are a good candidate, you will be sent to be evaluated by a surgeon to consider placement of a peritoneal dialysis catheter.  Your nephrologist should have shown you educational videos .  You should discuss with dialysis nurses about what life with a peritoneal dialysis catheter is like.  Most patients who have not had many abdominal operations are reasonable candidates for placement of a peritoneal dialysis catheter   It is important to understand the risks and benefits of the procedure prior to undergoing surgery.    If your surgeon feels you are a reasonable candidate, you will have a peritoneal dialysis catheter placed.    This requires an operation under general anesthesia.  The surgeon places a soft plastic tube ([place] curl catheter) into your abdomen.  The deeper curled tip rests down in the pelvis.  The middle part is tunneled inside your abdominal wall where cuffs provide a water-tight seal.  This leaves an outer foot of plastic tubing resting outside  your body, usually in your lower abdomen below the beltline near your waist.  This is a quick procedure that can often be done as an outpatient with the possibility of staying overnight.  It takes several weeks for the catheter to heal into a watertight seal.  Initially, your nephrologist we will have the dialysis nurses do gentle flushes through the catheter.  Once the catheter is well sealed in, you will begin larger exchanges of fluid to do full peritoneal dialysis.  Your nephrologist and dialysis nurses will help guide you through this.  While placement of the catheter is usually not technically difficult, there are risks to the procedure.  The biggest risk is infection.  This is why we place the catheter in the operating room with the use of intravenous antibiotics.  Using sterile technique to hook up the catheter to your dialysis fluids is essential.  Most infections of the catheter are mild and can be treated with antibiotics placed in the vein were placed with the peritoneal fluid.  However, sometimes the infection worsens or persists and the catheter needs to be removed.  Occasionally, the catheters can be blocked due to inner organs plugging up the tubing or kinks.  Sometimes the catheter leaks and needs to be replaced.  Sometimes the inner abdomen has too many adhesions from prior surgeries or infections and there is not enough space for fluid exchanges to occur.  Sometimes, peritoneal dialysis is not enough to compensate for the kidney failure.   In rare instances, the inner lining of the abdomen becomes severely thickened or inflamed and peritoneal dialysis can no longer work.  Sometimes it is not possible to safely replace a new catheter and the patient must go on hemodialysis permanently.  It is very common to need  hemodialysis intermittently even if you have a peritoneal dialysis catheter in cases of emergencies or if peritoneal dialysis fails.  Your nephrologist and surgeon can help you decide  what the best form of dialysis is for you

## 2016-07-08 ENCOUNTER — Emergency Department (HOSPITAL_COMMUNITY)
Admission: EM | Admit: 2016-07-08 | Discharge: 2016-07-09 | Disposition: A | Discharge status: Home / Self Care | Payer: Managed Care, Other (non HMO) | Attending: Emergency Medicine | Admitting: Emergency Medicine

## 2016-07-08 ENCOUNTER — Encounter (HOSPITAL_COMMUNITY): Payer: Self-pay | Admitting: Emergency Medicine

## 2016-07-08 DIAGNOSIS — R339 Retention of urine, unspecified: Secondary | ICD-10-CM | POA: Insufficient documentation

## 2016-07-08 DIAGNOSIS — N186 End stage renal disease: Secondary | ICD-10-CM | POA: Diagnosis not present

## 2016-07-08 DIAGNOSIS — I120 Hypertensive chronic kidney disease with stage 5 chronic kidney disease or end stage renal disease: Secondary | ICD-10-CM | POA: Insufficient documentation

## 2016-07-08 DIAGNOSIS — E1122 Type 2 diabetes mellitus with diabetic chronic kidney disease: Secondary | ICD-10-CM | POA: Diagnosis not present

## 2016-07-08 DIAGNOSIS — Z992 Dependence on renal dialysis: Secondary | ICD-10-CM | POA: Insufficient documentation

## 2016-07-08 DIAGNOSIS — Z79899 Other long term (current) drug therapy: Secondary | ICD-10-CM | POA: Diagnosis not present

## 2016-07-08 DIAGNOSIS — Z794 Long term (current) use of insulin: Secondary | ICD-10-CM | POA: Insufficient documentation

## 2016-07-08 DIAGNOSIS — R3915 Urgency of urination: Secondary | ICD-10-CM | POA: Diagnosis present

## 2016-07-08 LAB — URINALYSIS, ROUTINE W REFLEX MICROSCOPIC
Bilirubin Urine: NEGATIVE
GLUCOSE, UA: 150 mg/dL — AB
Hgb urine dipstick: NEGATIVE
KETONES UR: NEGATIVE mg/dL
Leukocytes, UA: NEGATIVE
Nitrite: NEGATIVE
SQUAMOUS EPITHELIAL / LPF: NONE SEEN
Specific Gravity, Urine: 1.009 (ref 1.005–1.030)
pH: 6 (ref 5.0–8.0)

## 2016-07-08 NOTE — ED Provider Notes (Signed)
[facility] DEPT Provider Note   CSN: [id number] Arrival date & time: 07/08/16  2030     History   Chief Complaint Chief Complaint  Patient presents with  . Post-op Problem    HPI Eugen Jeansonne is a 55 y.o. male.  Patient is a 55 year old male with history of diabetes and end-stage renal disease. He is currently on hemodialysis, however had a peritoneal dialysis catheter placed yesterday by Dr. Johney Maine. He also repaired bilateral inguinal hernias. The patient has been unable to urinate since the surgery. He feels lower abdominal distention and an urge to urinate, but can't. He denies any fevers or chills. He denies any vomiting or diarrhea.      Past Medical History:  Diagnosis Date  . Anemia   . Blind left eye   . Complication of anesthesia    Palpitations at dentist  . Diabetes mellitus without complication ([place])    Type II  . Diabetic retinopathy ([place])   . ESRD (end stage renal disease) ([place])    Hemodialysis TTHS  . Full dentures   . Headache   . Hyperlipidemia   . Hypertension   . Shortness of breath dyspnea    " only once after surgery, when going to bed" 07/06/16, patient denies shortness of breath  . Wears glasses     Patient Active Problem List   Diagnosis Date Noted  . Peritoneal dialysis catheter placed 07/07/2016 07/07/2016  . ESRD (end stage renal disease) on dialysis ([place]) 07/07/2016  . ESRD on dialysis ([place]) 12/05/2015  . Chronic kidney disease requiring chronic dialysis ([place]) 10/21/2015  . Chronic kidney disease (CKD) stage G4/A1 04/07/2015  . Edema 08/20/2014  . Diabetes mellitus type 2 with complications, uncontrolled ([place]) 05/06/2014  . Type 2 diabetes mellitus with diabetic nephropathy ([place]) 05/06/2014  . Bilateral inguinal hernia (BIH) s/p lap repair w mesh 07/07/2016 01/23/2014  . Diabetic retinopathy ([place]) 03/30/2013  . Essential hypertension 12/09/2012  . Hyperlipidemia 12/09/2012  . Helicobacter pylori antibody positive 12/09/2012     Past Surgical History:  Procedure Laterality Date  . AV FISTULA PLACEMENT Left 10/29/2015   Procedure: BRACHIOCEPHALIC ARTERIOVENOUS (AV) FISTULA CREATION;  Surgeon: Mal Misty, MD;  Location: [facility];  Service: Vascular;  Laterality: Left;  . COLONOSCOPY     One small polyp  . EYE SURGERY     both eyes-retine  . eyes     detached retina-lt  . INSERTION OF DIALYSIS CATHETER N/A 11/07/2015   Procedure: INSERTION OF DIALYSIS CATHETER;  Surgeon: Elam Dutch, MD;  Location: [facility];  Service: Vascular;  Laterality: N/A;  . IR GENERIC HISTORICAL  02/20/2016   IR REMOVAL TUN CV CATH W/O FL 02/20/2016 Corrie Mckusick, DO [facility]-INTERV RAD       Home Medications    Prior to Admission medications   Medication Sig Start Date End Date Taking? Authorizing Provider  amLODipine (NORVASC) 10 MG tablet Take 1 tablet (10 mg total) by mouth daily. 12/05/15   Wardell Honour, MD  atorvastatin (LIPITOR) 20 MG tablet Take 1 tablet (20 mg total) by mouth daily. 05/08/15   Sharion Balloon, FNP  Cholecalciferol (VITAMIN D3) 1000 units CAPS Take 1,000 Units by mouth daily.    Historical Provider, MD  hydrALAZINE (APRESOLINE) 50 MG tablet Take 1 tablet (50 mg total) by mouth 3 (three) times daily. 12/05/15   Wardell Honour, MD  Insulin Glargine (LANTUS) 100 UNIT/ML Solostar Pen Inject 20 Units into the skin daily at 10 pm. 03/15/16  Sharion Balloon, FNP  Insulin Pen Needle 31G X 4 MM MISC 1 application by Does not apply route 4 (four) times daily -  before meals and at bedtime. 03/15/16   Sharion Balloon, FNP  Insulin Syringe-Needle U-100 (INSULIN SYRINGE .5CC/31GX5/16") 31G X 5/16" 0.5 ML MISC Use to injection insulin once daily 04/07/15   Cherre Robins, PharmD  Insulin Syringes, Disposable, U-100 0.5 ML MISC Use to inject insulin once daily. 06/19/14   Cherre Robins, PharmD  lidocaine-prilocaine (EMLA) cream Apply 1 application topically as needed.  02/23/16   Historical Provider, MD  metoprolol (LOPRESSOR) 50 MG  tablet Take 1-1.5 tablets (50-75 mg total) by mouth 2 (two) times daily. Take 50 in AM and 75 mg at bedtime Patient taking differently: Take 50 mg by mouth 2 (two) times daily.  10/29/15   Samantha J Rhyne, PA-C  oxyCODONE (OXY IR/ROXICODONE) 5 MG immediate release tablet Take 1-2 tablets (5-10 mg total) by mouth every 4 (four) hours as needed for moderate pain, severe pain or breakthrough pain. 07/07/16   Michael Boston, MD  sevelamer carbonate (RENVELA) 800 MG tablet Take 1-2 tablets by mouth See admin instructions. 2 tablets 3 times daily with meals and 1 tablet 2 times daily with snacks 05/27/16   Historical Provider, MD  sodium bicarbonate 650 MG tablet Take 650 mg by mouth 3 (three) times daily.  07/02/15   Historical Provider, MD  timolol (BETIMOL) 0.5 % ophthalmic solution Place 1 drop into the right eye daily.    Historical Provider, MD    Family History Family History  Problem Relation Age of Onset  . Diabetes Mother   . Diabetes Sister   . Esophageal cancer Neg Hx   . Colon cancer Neg Hx   . Rectal cancer Neg Hx   . Stomach cancer Neg Hx     Social History Social History  Substance Use Topics  . Smoking status: Never Smoker  . Smokeless tobacco: Never Used  . Alcohol use No     Allergies   Patient has no known allergies.   Review of Systems Review of Systems  All other systems reviewed and are negative.    Physical Exam Updated Vital Signs BP 154/67 (BP Location: Right Arm)   Pulse 73   Temp 98.6 F (37 C) (Oral)   Resp 16   Ht 5\' 2"  (1.575 m)   Wt 132 lb (59.9 kg)   SpO2 100%   BMI 24.14 kg/m   Physical Exam  Constitutional: He is oriented to person, place, and time. He appears well-developed and well-nourished. No distress.  HENT:  Head: Normocephalic and atraumatic.  Mouth/Throat: Oropharynx is clear and moist.  Neck: Normal range of motion. Neck supple.  Cardiovascular: Normal rate and regular rhythm.  Exam reveals no friction rub.   No murmur  heard. Pulmonary/Chest: Effort normal and breath sounds normal. No respiratory distress. He has no wheezes. He has no rales.  Abdominal: Soft. Bowel sounds are normal. He exhibits distension. There is no tenderness.  There is mild suprapubic distention and tenderness.  The incision sites have a dressing in place. There is no drainage or erythema.  Musculoskeletal: Normal range of motion. He exhibits no edema.  Neurological: He is alert and oriented to person, place, and time. Coordination normal.  Skin: Skin is warm and dry. He is not diaphoretic.  Nursing note and vitals reviewed.    ED Treatments / Results  Labs (all labs ordered are listed, but only abnormal results  are displayed) Labs Reviewed  URINALYSIS, ROUTINE W REFLEX MICROSCOPIC    EKG  EKG Interpretation None       Radiology No results found.  Procedures Procedures (including critical care time)  Medications Ordered in ED Medications - No data to display   Initial Impression / Assessment and Plan / ED Course  I have reviewed the triage vital signs and the nursing notes.  Pertinent labs & imaging results that were available during my care of the patient were reviewed by me and considered in my medical decision making (see chart for details).  Clinical Course     Patient presents with complaints of urinary retention. He had a peritoneal dialysis catheter placed and 2 small hernias repaired yesterday and has not urinated since. His surgical incisions have their postoperative dressings in place and appear to be healing well. A Foley catheter was placed with several 100 mL of urine obtained. This has resolved his symptoms. His urinalysis is clear showing no sign of infection. He was given the option of either leaving the catheter in place were removing it. He has opted to have it removed and understands he may have to return if he is unable to void again.  Final Clinical Impressions(s) / ED Diagnoses   Final  diagnoses:  None    New Prescriptions New Prescriptions   No medications on file     Veryl Speak, MD 07/08/16 2346

## 2016-07-08 NOTE — ED Triage Notes (Signed)
Pt's daughter st's pt had a hernia repair yesterday.  Has not been able to urinate since yesterday.  Also pt is a dialysis pt and had a peritoneal cath placed yesterday.  Pt normally is able to make urine

## 2016-07-08 NOTE — Discharge Instructions (Signed)
Return to the emergency department if your symptoms recur, or you develop other new and concerning symptoms.

## 2016-07-09 NOTE — ED Notes (Signed)
Pt verbalized understanding discharge instructions and denies any further needs or questions at this time. VS stable, ambulatory and steady gait.   

## 2016-07-09 NOTE — ED Notes (Signed)
Bladder scanner is broken so was unable to scan Pt before foley insertion.  MD made aware and going ahead with Foley.

## 2016-07-09 NOTE — ED Notes (Signed)
This RN was told by the family that the Md said it was ok to have the Foley removed before they left the ED.  The Md made it apparent that this might occur again and if it does to come back to the ED. This RN talked to the Pt and Family about following up with a PCP and wrote down the number for [facility] for the Pt to call.

## 2016-07-13 ENCOUNTER — Encounter (HOSPITAL_COMMUNITY): Payer: Self-pay | Admitting: Surgery

## 2016-08-20 ENCOUNTER — Other Ambulatory Visit: Payer: Self-pay | Admitting: *Deleted

## 2016-10-08 ENCOUNTER — Encounter: Payer: Self-pay | Admitting: Family

## 2016-10-08 ENCOUNTER — Ambulatory Visit (INDEPENDENT_AMBULATORY_CARE_PROVIDER_SITE_OTHER): Payer: Managed Care, Other (non HMO) | Admitting: Family

## 2016-10-08 VITALS — BP 158/74 | HR 70 | Temp 97.5°F | Ht 62.0 in | Wt 130.4 lb

## 2016-10-08 DIAGNOSIS — IMO0002 Reserved for concepts with insufficient information to code with codable children: Secondary | ICD-10-CM

## 2016-10-08 DIAGNOSIS — N186 End stage renal disease: Secondary | ICD-10-CM

## 2016-10-08 DIAGNOSIS — E11319 Type 2 diabetes mellitus with unspecified diabetic retinopathy without macular edema: Secondary | ICD-10-CM

## 2016-10-08 DIAGNOSIS — E118 Type 2 diabetes mellitus with unspecified complications: Secondary | ICD-10-CM

## 2016-10-08 DIAGNOSIS — E1121 Type 2 diabetes mellitus with diabetic nephropathy: Secondary | ICD-10-CM | POA: Diagnosis not present

## 2016-10-08 DIAGNOSIS — E785 Hyperlipidemia, unspecified: Secondary | ICD-10-CM | POA: Diagnosis not present

## 2016-10-08 DIAGNOSIS — K219 Gastro-esophageal reflux disease without esophagitis: Secondary | ICD-10-CM

## 2016-10-08 DIAGNOSIS — I10 Essential (primary) hypertension: Secondary | ICD-10-CM

## 2016-10-08 DIAGNOSIS — Z794 Long term (current) use of insulin: Secondary | ICD-10-CM | POA: Diagnosis not present

## 2016-10-08 DIAGNOSIS — E1165 Type 2 diabetes mellitus with hyperglycemia: Secondary | ICD-10-CM | POA: Diagnosis not present

## 2016-10-08 DIAGNOSIS — Z992 Dependence on renal dialysis: Secondary | ICD-10-CM

## 2016-10-08 DIAGNOSIS — N184 Chronic kidney disease, stage 4 (severe): Secondary | ICD-10-CM

## 2016-10-08 LAB — BAYER DCA HB A1C WAIVED: HB A1C (BAYER DCA - WAIVED): 6.7 % (ref <7.0)

## 2016-10-08 MED ORDER — OMEPRAZOLE 20 MG PO CPDR: 20.0000 mg | DELAYED_RELEASE_CAPSULE | Freq: Every day | ORAL | 1 refills | Status: DC

## 2016-10-08 MED ORDER — INSULIN GLARGINE 100 UNIT/ML ~~LOC~~ SOLN: 20.0000 [IU] | Freq: Every day | SUBCUTANEOUS | 11 refills | Status: DC

## 2016-10-08 NOTE — Progress Notes (Signed)
_0  Subjective:    Patient ID: Craig Fisher, male    DOB: 1960-08-20, 56 y.o.   MRN: [id number]  Pt presents to the office today for chronic follow up. PT current on dialysis for end stage renal disease every day at home Hypertension  This is a chronic problem. The current episode started more than 1 year ago. The problem has been waxing and waning since onset. The problem is uncontrolled. Pertinent negatives include no anxiety, chest pain, palpitations, peripheral edema or shortness of breath. Risk factors for coronary artery disease include dyslipidemia and male gender. Past treatments include diuretics and calcium channel blockers. The current treatment provides mild improvement. Hypertensive end-organ damage includes kidney disease. There is no history of CAD/MI, CVA or heart failure. There is no history of sleep apnea or a thyroid problem.  Diabetes  He presents for his follow-up diabetic visit. He has type 2 diabetes mellitus. His disease course has been fluctuating. Pertinent negatives for hypoglycemia include no confusion or dizziness. Associated symptoms include visual change. Pertinent negatives for diabetes include no chest pain, no foot paresthesias and no foot ulcerations. Symptoms are worsening. Diabetic complications include nephropathy. Pertinent negatives for diabetic complications include no CVA, heart disease or peripheral neuropathy. Risk factors for coronary artery disease include diabetes mellitus, dyslipidemia, hypertension and male sex. Current diabetic treatment includes insulin injections. He is compliant with treatment none of the time. His breakfast blood glucose range is generally 140-180 mg/dl. ( ) An ACE inhibitor/angiotensin II receptor blocker is contraindicated. Eye exam is current.  Hyperlipidemia  This is a chronic problem. The current episode started more than 1 year ago. The problem is controlled. Recent lipid tests were reviewed and are normal. Exacerbating diseases  include diabetes. Pertinent negatives include no chest pain, leg pain or shortness of breath. Current antihyperlipidemic treatment includes diet change and statins. The current treatment provides moderate improvement of lipids. Risk factors for coronary artery disease include diabetes mellitus, dyslipidemia, male sex and hypertension.  Gastroesophageal Reflux  He complains of heartburn and nausea. He reports no chest pain or no coughing. This is a chronic problem. The current episode started more than 1 month ago. The problem occurs frequently. The problem has been waxing and waning. The heartburn duration is several minutes. The heartburn is located in the substernum. The heartburn is of moderate intensity. The heartburn does not wake him from sleep. The symptoms are aggravated by lying down.      Review of Systems  Constitutional: Negative.   HENT: Negative.   Respiratory: Negative.  Negative for cough and shortness of breath.   Cardiovascular: Negative.  Negative for chest pain and palpitations.  Gastrointestinal: Positive for heartburn and nausea.  Endocrine: Negative.   Genitourinary: Negative.   Musculoskeletal: Negative.   Neurological: Negative.  Negative for dizziness.  Hematological: Negative.   Psychiatric/Behavioral: Negative.  Negative for confusion.  All other systems reviewed and are negative.      Objective:   Physical Exam  Constitutional: He is oriented to person, place, and time. He appears well-developed and well-nourished. No distress.  HENT:  Head: Normocephalic.  Right Ear: External ear normal.  Left Ear: External ear normal.  Nose: Nose normal.  Mouth/Throat: Oropharynx is clear and moist.  Eyes: Pupils are equal, round, and reactive to light. Right eye exhibits no discharge. Left eye exhibits no discharge.  Neck: Normal range of motion. Neck supple. No thyromegaly present.  Cardiovascular: Normal rate, regular rhythm, normal heart sounds and intact distal  pulses.  No murmur heard. Pulmonary/Chest: Effort normal and breath sounds normal. No respiratory distress. He has no wheezes.  Abdominal: Soft. Bowel sounds are normal. He exhibits no distension. There is no tenderness.  Musculoskeletal: Normal range of motion. He exhibits no edema or tenderness.  Neurological: He is alert and oriented to person, place, and time. He has normal reflexes. No cranial nerve deficit.  Skin: Skin is warm and dry. No rash noted. No erythema.  Psychiatric: He has a normal mood and affect. His behavior is normal. Judgment and thought content normal.  Vitals reviewed.     BP (!) 158/74   Pulse 70   Temp 97.5 F (36.4 C) (Oral)   Ht 5' 2" (1.575 m)   Wt 130 lb 6.4 oz (59.1 kg)   BMI 23.85 kg/m      Assessment & Plan:  1. Type 2 diabetes mellitus with diabetic nephropathy, with long-term current use of insulin (HCC) - CMP14+EGFR - Bayer DCA Hb A1c Waived  2. Essential hypertension - CMP14+EGFR  3. Hyperlipidemia, unspecified hyperlipidemia type - CMP14+EGFR - Lipid panel  4. Chronic kidney disease (CKD) stage G4/A1 - CMP14+EGFR  5. Chronic kidney disease requiring chronic dialysis (HCC) - CMP14+EGFR  6. ESRD (end stage renal disease) on dialysis ([place]) - CMP14+EGFR  7. Uncontrolled type 2 diabetes mellitus with complication, with long-term current use of insulin (HCC)  - CMP14+EGFR - insulin glargine (LANTUS) 100 UNIT/ML injection; Inject 0.2 mLs (20 Units total) into the skin at bedtime.  Dispense: 10 mL; Refill: 11  8. Diabetic retinopathy of both eyes associated with type 2 diabetes mellitus, macular edema presence unspecified, unspecified retinopathy severity ([place]) - CMP14+EGFR   9. Gastroesophageal reflux disease, esophagitis presence not specified -Started on Prilosec 20 mg today -Diet discussed- Avoid fried, spicy, citrus foods, caffeine and alcohol -Do not eat 2-3 hours before bedtime -Encouraged small frequent meals -Avoid  NSAID's - omeprazole (PRILOSEC) 20 MG capsule; Take 1 capsule (20 mg total) by mouth daily.  Dispense: 90 capsule; Refill: 1   Continue all meds Labs pending Health Maintenance reviewed Diet and exercise encouraged RTO 3 months   Evelina Dun, FNP

## 2016-10-08 NOTE — Patient Instructions (Signed)
La diabetes mellitus y los alimentos (Diabetes Mellitus and Food) Es importante que controle su nivel de azcar en la sangre (glucosa). El nivel de glucosa en sangre depende en gran medida de lo que usted come. Comer alimentos saludables en las cantidades [place] a lo largo del Training and development officer, aproximadamente a la misma hora [employer], lo ayudar a Chief Technology Officer su nivel de Multimedia programmer. Tambin puede ayudarlo a retrasar o Patent attorney de la diabetes mellitus. Comer de [employer] saludable incluso puede ayudarlo a Chartered loss adjuster de presin arterial y a Science writer o Theatre manager un peso saludable. Entre las recomendaciones generales para alimentarse y Audiological scientist los alimentos de forma saludable, se incluyen las siguientes:  Respetar las comidas principales y comer colaciones con regularidad. Evitar pasar largos perodos sin comer con el fin de perder peso.  Seguir una dieta que consista principalmente en alimentos de origen vegetal, como frutas, vegetales, frutos secos, legumbres y cereales integrales.  Utilizar mtodos de coccin a baja temperatura, como hornear, en lugar de mtodos de coccin a alta temperatura, como frer en abundante aceite. Trabaje con el nutricionista para aprender a Financial planner nutricional de las etiquetas de los alimentos. CMO PUEDEN AFECTARME LOS ALIMENTOS? Carbohidratos Los carbohidratos afectan el nivel de glucosa en sangre ms que cualquier otro tipo de alimento. El nutricionista lo ayudar a Teacher, adult education cuntos carbohidratos puede consumir en cada comida y ensearle a contarlos. El recuento de carbohidratos es importante para mantener la glucosa en sangre en un nivel saludable, en especial si utiliza insulina o toma determinados medicamentos para la diabetes mellitus. Alcohol El alcohol puede provocar disminuciones sbitas de la glucosa en sangre (hipoglucemia), en especial si utiliza insulina o toma determinados medicamentos para la diabetes mellitus. La  hipoglucemia es una afeccin que puede poner en peligro la vida. Los sntomas de la hipoglucemia (somnolencia, mareos y Data processing manager) son similares a los sntomas de haber consumido mucho alcohol. Si el mdico lo autoriza a beber alcohol, hgalo con moderacin y siga estas pautas:  Las mujeres no deben beber ms de un trago por da, y los hombres no deben beber ms de dos tragos por Training and development officer. Un trago es igual a:  12 onzas (355 ml) de cerveza  5 onzas de vino (150 ml) de vino  1,5onzas (23m) de bebidas espirituosas  No beba con el estmago vaco.  Mantngase hidratado. Beba agua, gaseosas dietticas o t helado sin azcar.  Las gaseosas comunes, los jugos y otros refrescos podran contener muchos carbohidratos y se dCivil Service fast streamer QU ALIMENTOS NO SE RECOMIENDAN? Cuando haga las elecciones de alimentos, es importante que recuerde que todos los alimentos son distintos. Algunos tienen menos nutrientes que otros por porcin, aunque podran tener la misma cantidad de caloras o carbohidratos. Es difcil darle al cuerpo lo que necesita cuando consume alimentos con menos nutrientes. Estos son algunos ejemplos de alimentos que debera evitar ya que contienen muchas caloras y carbohidratos, pero pocos nutrientes:  GPhysicist, medicaltrans (la mayora de los alimentos procesados incluyen grasas trans en la etiqueta de Informacin nutricional).  Gaseosas comunes.  Jugos.  Caramelos.  Dulces, como tortas, pasteles, rosquillas y g[place]  Comidas fritas. QU ALIMENTOS PUEDO COMER? Consuma alimentos ricos en nutrientes, que nutrirn el cuerpo y lo mantendrn saludable. Los alimentos que debe comer tambin dependern de varios factores, como:  Las caloras que necesita.  Los medicamentos que toma.  Su peso.  El nivel de glucosa en sElizabeth  El n[place]de presin arterial.  El nivel de colesterol. Debe consumir  una amplia variedad de alimentos, por ejemplo:  Protenas.  Cortes de [employer].  Protenas con bajo contenido de grasas saturadas, como pescado, clara de huevo y frijoles. Evite las carnes procesadas.  Frutas y vegetales.  Frutas y Photographer que pueden ayudar a Chief Technology Officer los niveles sanguneos de [place], como [place], mangos y batatas.  Productos lcteos.  Elija productos lcteos sin grasa o con bajo contenido de [place], como [place], yogur y [place].  Cereales, panes, pastas y arroz.  Elija cereales integrales, como panes multicereales, avena en grano y arroz integral. Estos alimentos pueden ayudar a controlar la presin arterial.  Daphene Jaeger.  Alimentos que contengan grasas saludables, como frutos secos, Musician, aceite de [place], aceite de canola y pescado. TODOS LOS QUE PADECEN DIABETES MELLITUS TIENEN EL [place] PLAN DE [place]? Dado que todas las personas que padecen diabetes mellitus son distintas, no hay un solo plan de comidas que funcione para todos. Es muy importante que se rena con un nutricionista que lo ayudar a crear un plan de comidas adecuado para usted. Esta informacin no tiene Marine scientist el consejo del mdico. Asegrese de hacerle al mdico cualquier pregunta que tenga. Document Released: 10/12/2007 Document Revised: 07/26/2014 Document Reviewed: 06/01/2013 Elsevier Interactive Patient Education  2017 [employer].

## 2016-10-09 LAB — LIPID PANEL
CHOLESTEROL TOTAL: 196 mg/dL (ref 100–199)
Chol/HDL Ratio: 4 ratio units (ref 0.0–5.0)
HDL: 49 mg/dL (ref >39)
Triglycerides: 412 mg/dL — ABNORMAL HIGH (ref 0–149)

## 2016-10-09 LAB — CMP14+EGFR
ALBUMIN: 3.5 g/dL (ref 3.5–5.5)
ALT: 10 IU/L (ref 0–44)
AST: 16 IU/L (ref 0–40)
Albumin/Globulin Ratio: 1.5 (ref 1.2–2.2)
Alkaline Phosphatase: 63 IU/L (ref 39–117)
BILIRUBIN TOTAL: 0.2 mg/dL (ref 0.0–1.2)
BUN / CREAT RATIO: 6 — AB (ref 9–20)
BUN: 59 mg/dL — AB (ref 6–24)
CHLORIDE: 89 mmol/L — AB (ref 96–106)
CO2: 25 mmol/L (ref 18–29)
CREATININE: 9.87 mg/dL — AB (ref 0.76–1.27)
Calcium: 7.8 mg/dL — ABNORMAL LOW (ref 8.7–10.2)
GFR calc non Af Amer: 5 mL/min/{1.73_m2} — ABNORMAL LOW (ref >59)
GFR, EST AFRICAN AMERICAN: 6 mL/min/{1.73_m2} — AB (ref >59)
GLUCOSE: 125 mg/dL — AB (ref 65–99)
Globulin, Total: 2.4 g/dL (ref 1.5–4.5)
Potassium: 4 mmol/L (ref 3.5–5.2)
Sodium: 133 mmol/L — ABNORMAL LOW (ref 134–144)
TOTAL PROTEIN: 5.9 g/dL — AB (ref 6.0–8.5)

## 2016-10-11 ENCOUNTER — Other Ambulatory Visit: Payer: Self-pay | Admitting: Family

## 2016-10-11 MED ORDER — ATORVASTATIN CALCIUM 20 MG PO TABS: 20.0000 mg | ORAL_TABLET | Freq: Every day | ORAL | 1 refills | Status: DC

## 2016-10-12 ENCOUNTER — Encounter: Payer: Self-pay | Admitting: *Deleted

## 2016-10-13 ENCOUNTER — Encounter: Payer: Self-pay | Admitting: Cardiovascular Disease

## 2016-10-13 ENCOUNTER — Telehealth: Payer: Self-pay | Admitting: Cardiovascular Disease

## 2016-10-13 ENCOUNTER — Ambulatory Visit (INDEPENDENT_AMBULATORY_CARE_PROVIDER_SITE_OTHER): Payer: Managed Care, Other (non HMO) | Admitting: Cardiovascular Disease

## 2016-10-13 VITALS — BP 171/73 | HR 61 | Ht 64.0 in | Wt 131.8 lb

## 2016-10-13 DIAGNOSIS — N2889 Other specified disorders of kidney and ureter: Secondary | ICD-10-CM | POA: Diagnosis not present

## 2016-10-13 DIAGNOSIS — R011 Cardiac murmur, unspecified: Secondary | ICD-10-CM

## 2016-10-13 DIAGNOSIS — N186 End stage renal disease: Secondary | ICD-10-CM

## 2016-10-13 DIAGNOSIS — I151 Hypertension secondary to other renal disorders: Secondary | ICD-10-CM | POA: Diagnosis not present

## 2016-10-13 DIAGNOSIS — E7800 Pure hypercholesterolemia, unspecified: Secondary | ICD-10-CM | POA: Diagnosis not present

## 2016-10-13 NOTE — Telephone Encounter (Signed)
Pre-cert Verification for the following procedure   Echo scheduled for 2/29/18

## 2016-10-13 NOTE — Progress Notes (Signed)
CARDIOLOGY CONSULT NOTE  Patient ID: Craig Fisher MRN: [id number] DOB/AGE: 11-02-1960 56 y.o.  Admit date: (Not on file) Primary Physician: Evelina Dun, [place] Referring Physician: Lenna Gilford  Reason for Consultation: murmur  HPI: The patient is a 56 year old male with a past medical history significant for hypertension, diabetes, hyperlipidemia, and end-stage renal disease who is referred by his PCP for the evaluation of a heart murmur.  Review of labs on 10/08/16 demonstrated the following: Total cholesterol 196, triglycerides elevated at 412, HDL 49, LDL could not be calculated due to high triglycerides. BUN 59, creatinine 9.87.  ECG performed in the office today which I ordered and personally reviewed demonstrates normal sinus rhythm with no ischemic ST segment or T-wave abnormalities, nor any arrhythmias.  He was never told in childhood that he had a murmur.  It was discovered by a healthcare provider during dialysis within the past several weeks. It was described as "harsh". He denies exertional chest pain over the past several months. He only becomes short of breath when his abdomen is filled up with fluid prior to undergoing dialysis.  He denies lightheadedness, dizziness, and syncope. He denies palpitations.  There is no family history of valvular heart disease.  He has been undergoing peritoneal dialysis for the past one year.    No Known Allergies  Current Outpatient Prescriptions  Medication Sig Dispense Refill  . amLODipine (NORVASC) 10 MG tablet Take 1 tablet (10 mg total) by mouth daily. 90 tablet 0  . atorvastatin (LIPITOR) 20 MG tablet Take 1 tablet (20 mg total) by mouth daily. 90 tablet 1  . hydrALAZINE (APRESOLINE) 50 MG tablet Take 1 tablet (50 mg total) by mouth 3 (three) times daily. 270 tablet 0  . insulin glargine (LANTUS) 100 UNIT/ML injection Inject 0.2 mLs (20 Units total) into the skin at bedtime. 10 mL 11  . Insulin Pen Needle 31G X 4 MM MISC 1  application by Does not apply route 4 (four) times daily -  before meals and at bedtime. 100 each 11  . Insulin Syringe-Needle U-100 (INSULIN SYRINGE .5CC/31GX5/16") 31G X 5/16" 0.5 ML MISC Use to injection insulin once daily 100 each 1  . Insulin Syringes, Disposable, U-100 0.5 ML MISC Use to inject insulin once daily. 100 each 2  . metoprolol (LOPRESSOR) 50 MG tablet Take 1-1.5 tablets (50-75 mg total) by mouth 2 (two) times daily. Take 50 in AM and 75 mg at bedtime (Patient taking differently: Take 50 mg by mouth 2 (two) times daily. ) 75 tablet 6  . omeprazole (PRILOSEC) 20 MG capsule Take 1 capsule (20 mg total) by mouth daily. 90 capsule 1  . sevelamer carbonate (RENVELA) 800 MG tablet Take 1-2 tablets by mouth See admin instructions. 2 tablets 3 times daily with meals and 1 tablet 2 times daily with snacks    . sodium bicarbonate 650 MG tablet Take 650 mg by mouth 3 (three) times daily.      No current facility-administered medications for this visit.     Past Medical History:  Diagnosis Date  . Anemia   . Blind left eye   . Complication of anesthesia    Palpitations at dentist  . Diabetes mellitus without complication ([place])    Type II  . Diabetic retinopathy ([place])   . ESRD (end stage renal disease) ([place])    Hemodialysis TTHS  . Full dentures   . Headache   . Hyperlipidemia   . Hypertension   . Shortness  of breath dyspnea    " only once after surgery, when going to bed" 07/06/16, patient denies shortness of breath  . Wears glasses     Past Surgical History:  Procedure Laterality Date  . AV FISTULA PLACEMENT Left 10/29/2015   Procedure: BRACHIOCEPHALIC ARTERIOVENOUS (AV) FISTULA CREATION;  Surgeon: Mal Misty, MD;  Location: [facility];  Service: Vascular;  Laterality: Left;  . CAPD INSERTION N/A 07/07/2016   Procedure: LAPAROSCOPIC INSERTION CONTINUOUS AMBULATORY PERITONEAL DIALYSIS CATHETER WITH OMENTOPEXY;  Surgeon: Michael Boston, MD;  Location: [facility];  Service: General;   Laterality: N/A;  . COLONOSCOPY     One small polyp  . EYE SURGERY     both eyes-retine  . eyes     detached retina-lt  . INGUINAL HERNIA REPAIR Bilateral 07/07/2016   Procedure: REPAIR BILATERAL INGUINAL HERNIAS;  Surgeon: Michael Boston, MD;  Location: [facility];  Service: General;  Laterality: Bilateral;  . INSERTION OF DIALYSIS CATHETER N/A 11/07/2015   Procedure: INSERTION OF DIALYSIS CATHETER;  Surgeon: Elam Dutch, MD;  Location: [facility];  Service: Vascular;  Laterality: N/A;  . INSERTION OF MESH Bilateral 07/07/2016   Procedure: INSERTION OF MESH;  Surgeon: Michael Boston, MD;  Location: [facility];  Service: General;  Laterality: Bilateral;  . IR GENERIC HISTORICAL  02/20/2016   IR REMOVAL TUN CV CATH W/O FL 02/20/2016 Corrie Mckusick, DO [facility]    Social History   Social History  . Marital status: Married    Spouse name: N/A  . Number of children: N/A  . Years of education: N/A   Occupational History  . Not on file.   Social History Main Topics  . Smoking status: Never Smoker  . Smokeless tobacco: Never Used  . Alcohol use No  . Drug use: No  . Sexual activity: Yes    Birth control/ protection: Condom   Other Topics Concern  . Not on file   Social History Narrative   Mostly bilingual but more comfortable speaking Spanish     No family history of premature CAD in 1st degree relatives.  Prior to Admission medications   Medication Sig Start Date End Date Taking? Authorizing Provider  amLODipine (NORVASC) 10 MG tablet Take 1 tablet (10 mg total) by mouth daily. 12/05/15  Yes Wardell Honour, MD  atorvastatin (LIPITOR) 20 MG tablet Take 1 tablet (20 mg total) by mouth daily. 10/11/16  Yes Sharion Balloon, FNP  hydrALAZINE (APRESOLINE) 50 MG tablet Take 1 tablet (50 mg total) by mouth 3 (three) times daily. 12/05/15  Yes Wardell Honour, MD  insulin glargine (LANTUS) 100 UNIT/ML injection Inject 0.2 mLs (20 Units total) into the skin at bedtime. 10/08/16  Yes Sharion Balloon, FNP  Insulin Pen Needle 31G X 4 MM MISC 1 application by Does not apply route 4 (four) times daily -  before meals and at bedtime. 03/15/16  Yes Sharion Balloon, FNP  Insulin Syringe-Needle U-100 (INSULIN SYRINGE .5CC/31GX5/16") 31G X 5/16" 0.5 ML MISC Use to injection insulin once daily 04/07/15  Yes Tammy Eckard, PharmD  Insulin Syringes, Disposable, U-100 0.5 ML MISC Use to inject insulin once daily. 06/19/14  Yes Cherre Robins, PharmD  metoprolol (LOPRESSOR) 50 MG tablet Take 1-1.5 tablets (50-75 mg total) by mouth 2 (two) times daily. Take 50 in AM and 75 mg at bedtime Patient taking differently: Take 50 mg by mouth 2 (two) times daily.  10/29/15  Yes Samantha J Rhyne, PA-C  omeprazole (PRILOSEC) 20  MG capsule Take 1 capsule (20 mg total) by mouth daily. 10/08/16  Yes Sharion Balloon, FNP  sevelamer carbonate (RENVELA) 800 MG tablet Take 1-2 tablets by mouth See admin instructions. 2 tablets 3 times daily with meals and 1 tablet 2 times daily with snacks 05/27/16  Yes Historical Provider, MD  sodium bicarbonate 650 MG tablet Take 650 mg by mouth 3 (three) times daily.  07/02/15  Yes Historical Provider, MD     Review of systems complete and found to be negative unless listed above in HPI     Physical exam Blood pressure (!) 171/73, pulse 61, height 5\' 4"  (1.626 m), weight 131 lb 12.8 oz (59.8 kg), SpO2 100 %. General: NAD Neck: No JVD, no thyromegaly or thyroid nodule.  Lungs: Clear to auscultation bilaterally with normal respiratory effort. CV: Nondisplaced PMI. Regular rate and rhythm, normal S1/S2, no C1/K4, 3/6 holosystolic murmur heard at both left and right upper sternal borders with 2/4 holodiastolic murmur along left sternal border murmur.  No peripheral edema.  No carotid bruit.    Abdomen: Soft, nontender, no distention.  Skin: Intact without lesions or rashes.  Neurologic: Alert and oriented x 3.  Psych: Normal affect. Extremities: No clubbing or cyanosis.  HEENT: Normal.     ECG: Most recent ECG reviewed.  Telemetry: Independently reviewed.  Labs:   Lab Results  Component Value Date   WBC 6.6 07/02/2016   HGB 10.9 (L) 07/07/2016   HCT 32.0 (L) 07/07/2016   MCV 89.3 07/02/2016   PLT 258 07/02/2016    Recent Labs Lab 10/08/16 1605  NA 133*  K 4.0  CL 89*  CO2 25  BUN 59*  CREATININE 9.87*  CALCIUM 7.8*  PROT 5.9*  BILITOT 0.2  ALKPHOS 63  ALT 10  AST 16  GLUCOSE 125*   No results found for: CKTOTAL, CKMB, CKMBINDEX, TROPONINI Lab Results  Component Value Date   CHOL 196 10/08/2016   CHOL 186 03/15/2016   CHOL 183 12/05/2015   Lab Results  Component Value Date   HDL 49 10/08/2016   HDL 39 (L) 03/15/2016   HDL 50 12/05/2015   Lab Results  Component Value Date   LDLCALC Comment 10/08/2016   LDLCALC 88 03/15/2016   LDLCALC 89 12/05/2015   Lab Results  Component Value Date   TRIG 412 (H) 10/08/2016   TRIG 293 (H) 03/15/2016   TRIG 219 (H) 12/05/2015   Lab Results  Component Value Date   CHOLHDL 4.0 10/08/2016   CHOLHDL 4.8 03/15/2016   CHOLHDL 3.7 12/05/2015   Lab Results  Component Value Date   LDLDIRECT 123 (H) 06/21/2014         Studies: No results found.  ASSESSMENT AND PLAN:  1. Cardiac murmur: I will order a 2-D echocardiogram with Doppler to evaluate cardiac structure, function, and regional wall motion.  2. Hypertension: Markedly elevated. If it remains elevated at his next visit I would consider increasing hydralazine to 75 mg 3 times daily.  3. Hyperlipidemia: Continue Lipitor 20 mg daily.  4. ESRD: On peritoneal dialysis.  Dispo: fu 6 weeks.   Signed: Kate Sable, M.D., F.A.C.C.  10/13/2016, 8:45 AM

## 2016-10-13 NOTE — Patient Instructions (Signed)
Your physician recommends that you schedule a follow-up appointment in: [place]  Your physician recommends that you continue on your current medications as directed. Please refer to the Current Medication list given to you today.  Your physician has requested that you have an echocardiogram. Echocardiography is a painless test that uses sound waves to create images of your heart. It provides your doctor with information about the size and shape of your heart and how well your heart's chambers and valves are working. This procedure takes approximately one hour. There are no restrictions for this procedure.  Thank you for choosing [facility]!!

## 2016-10-14 ENCOUNTER — Ambulatory Visit (INDEPENDENT_AMBULATORY_CARE_PROVIDER_SITE_OTHER): Payer: Managed Care, Other (non HMO)

## 2016-10-14 ENCOUNTER — Other Ambulatory Visit: Payer: Self-pay

## 2016-10-14 DIAGNOSIS — R011 Cardiac murmur, unspecified: Secondary | ICD-10-CM

## 2016-10-18 ENCOUNTER — Telehealth: Payer: Self-pay | Admitting: *Deleted

## 2016-10-18 NOTE — Telephone Encounter (Signed)
Notes recorded by Laurine Blazer, LPN on 08/23/1751 at 0:10 AM EDT Left message with daughter for return call. ------  Notes recorded by Herminio Commons, MD on 10/14/2016 at 5:25 PM EDT Normal pumping function. Mild valve leakage. No significant valve problems.

## 2016-10-21 ENCOUNTER — Other Ambulatory Visit: Payer: Self-pay | Admitting: Family

## 2016-10-21 MED ORDER — INSULIN DEGLUDEC 200 UNIT/ML ~~LOC~~ SOPN: 20.0000 [IU] | PEN_INJECTOR | Freq: Every day | SUBCUTANEOUS | 11 refills | Status: DC

## 2016-10-21 NOTE — Progress Notes (Signed)
Lantus changed to [name]

## 2016-10-26 ENCOUNTER — Telehealth: Payer: Self-pay | Admitting: Family

## 2016-10-26 NOTE — Telephone Encounter (Signed)
Notes recorded by Laurine Blazer, LPN on 08/02/7260 at 0:35 PM EDT Daughter Sydnee Levans) notified. Copy to pmd. Follow up scheduled for 11/24/2016 with Dr. Bronson Ing.

## 2016-10-26 NOTE — Telephone Encounter (Signed)
Notes recorded by Laurine Blazer, LPN on 3/41/9379 at 0:24 PM EDT No answer.

## 2016-10-27 NOTE — Telephone Encounter (Signed)
Attempted to contact patient- no answer no vm.

## 2016-10-27 NOTE — Telephone Encounter (Signed)
I had changed this to [name] per insurance. New rx sent to his pharmacy.

## 2016-10-27 NOTE — Telephone Encounter (Signed)
Form put in your box for this

## 2016-11-01 NOTE — Telephone Encounter (Signed)
Patient aware of medication change and states that he has already picked up [name] Rx from pharmacy

## 2016-11-24 ENCOUNTER — Ambulatory Visit (INDEPENDENT_AMBULATORY_CARE_PROVIDER_SITE_OTHER): Payer: Managed Care, Other (non HMO) | Admitting: Cardiovascular Disease

## 2016-11-24 ENCOUNTER — Encounter: Payer: Self-pay | Admitting: Cardiovascular Disease

## 2016-11-24 ENCOUNTER — Telehealth: Payer: Self-pay | Admitting: Cardiovascular Disease

## 2016-11-24 VITALS — BP 195/80 | HR 66 | Ht 64.0 in | Wt 129.0 lb

## 2016-11-24 DIAGNOSIS — I10 Essential (primary) hypertension: Secondary | ICD-10-CM | POA: Diagnosis not present

## 2016-11-24 DIAGNOSIS — R011 Cardiac murmur, unspecified: Secondary | ICD-10-CM

## 2016-11-24 DIAGNOSIS — Z01812 Encounter for preprocedural laboratory examination: Secondary | ICD-10-CM | POA: Diagnosis not present

## 2016-11-24 DIAGNOSIS — E782 Mixed hyperlipidemia: Secondary | ICD-10-CM | POA: Diagnosis not present

## 2016-11-24 DIAGNOSIS — Z992 Dependence on renal dialysis: Secondary | ICD-10-CM | POA: Diagnosis not present

## 2016-11-24 DIAGNOSIS — N186 End stage renal disease: Secondary | ICD-10-CM

## 2016-11-24 MED ORDER — HYDRALAZINE HCL 50 MG PO TABS: 75.0000 mg | ORAL_TABLET | Freq: Three times a day (TID) | ORAL | 3 refills | Status: AC

## 2016-11-24 MED ORDER — HYDRALAZINE HCL 50 MG PO TABS: 75.0000 mg | ORAL_TABLET | Freq: Three times a day (TID) | ORAL | 3 refills | Status: DC

## 2016-11-24 NOTE — Progress Notes (Signed)
SUBJECTIVE: The patient returns for follow-up after undergoing cardiovascular testing performed for the evaluation of a murmur.  Echocardiogram 10/14/16: Normal left ventricular systolic function and regional wall motion, LVEF 50-55%, indeterminate diastolic dysfunction, high filling pressures, aortic valve sclerosis without stenosis, mild mitral and tricuspid regurgitation.  He denies chest pain, palpitations, shortness of breath, leg swelling, left arm pain, and weakness.  He has headaches on occasion.  He checks his blood pressure every morning and evening with systolic blood pressures in the 160-170 range.    Review of Systems: As per "subjective", otherwise negative.  No Known Allergies  Current Outpatient Prescriptions  Medication Sig Dispense Refill  . amLODipine (NORVASC) 10 MG tablet Take 1 tablet (10 mg total) by mouth daily. 90 tablet 0  . atorvastatin (LIPITOR) 20 MG tablet Take 1 tablet (20 mg total) by mouth daily. 90 tablet 1  . hydrALAZINE (APRESOLINE) 50 MG tablet Take 1 tablet (50 mg total) by mouth 3 (three) times daily. 270 tablet 0  . Insulin Degludec (TRESIBA FLEXTOUCH) 200 UNIT/ML SOPN Inject 20 Units into the skin daily. 3 pen 11  . Insulin Pen Needle 31G X 4 MM MISC 1 application by Does not apply route 4 (four) times daily -  before meals and at bedtime. 100 each 11  . Insulin Syringe-Needle U-100 (INSULIN SYRINGE .5CC/31GX5/16") 31G X 5/16" 0.5 ML MISC Use to injection insulin once daily 100 each 1  . Insulin Syringes, Disposable, U-100 0.5 ML MISC Use to inject insulin once daily. 100 each 2  . metoprolol (LOPRESSOR) 50 MG tablet Take 1-1.5 tablets (50-75 mg total) by mouth 2 (two) times daily. Take 50 in AM and 75 mg at bedtime (Patient taking differently: Take 50 mg by mouth 2 (two) times daily. ) 75 tablet 6  . multivitamin (RENA-VIT) TABS tablet Take 1 tablet by mouth daily.    Marland Kitchen omeprazole (PRILOSEC) 20 MG capsule Take 1 capsule (20 mg total) by  mouth daily. 90 capsule 1  . sevelamer carbonate (RENVELA) 800 MG tablet Take 1-2 tablets by mouth See admin instructions. 2 tablets 3 times daily with meals and 1 tablet 2 times daily with snacks    . sodium bicarbonate 650 MG tablet Take 650 mg by mouth 3 (three) times daily.      No current facility-administered medications for this visit.     Past Medical History:  Diagnosis Date  . Anemia   . Blind left eye   . Complication of anesthesia    Palpitations at dentist  . Diabetes mellitus without complication ([place])    Type II  . Diabetic retinopathy ([place])   . ESRD (end stage renal disease) ([place])    Hemodialysis TTHS  . Full dentures   . Headache   . Hyperlipidemia   . Hypertension   . Shortness of breath dyspnea    " only once after surgery, when going to bed" 07/06/16, patient denies shortness of breath  . Wears glasses     Past Surgical History:  Procedure Laterality Date  . AV FISTULA PLACEMENT Left 10/29/2015   Procedure: BRACHIOCEPHALIC ARTERIOVENOUS (AV) FISTULA CREATION;  Surgeon: Mal Misty, MD;  Location: [facility];  Service: Vascular;  Laterality: Left;  . CAPD INSERTION N/A 07/07/2016   Procedure: LAPAROSCOPIC INSERTION CONTINUOUS AMBULATORY PERITONEAL DIALYSIS CATHETER WITH OMENTOPEXY;  Surgeon: Michael Boston, MD;  Location: [facility];  Service: General;  Laterality: N/A;  . COLONOSCOPY     One small polyp  . EYE  SURGERY     both eyes-retine  . eyes     detached retina-lt  . INGUINAL HERNIA REPAIR Bilateral 07/07/2016   Procedure: REPAIR BILATERAL INGUINAL HERNIAS;  Surgeon: Michael Boston, MD;  Location: [facility];  Service: General;  Laterality: Bilateral;  . INSERTION OF DIALYSIS CATHETER N/A 11/07/2015   Procedure: INSERTION OF DIALYSIS CATHETER;  Surgeon: Elam Dutch, MD;  Location: [facility];  Service: Vascular;  Laterality: N/A;  . INSERTION OF MESH Bilateral 07/07/2016   Procedure: INSERTION OF MESH;  Surgeon: Michael Boston, MD;  Location: [facility];  Service: General;   Laterality: Bilateral;  . IR GENERIC HISTORICAL  02/20/2016   IR REMOVAL TUN CV CATH W/O FL 02/20/2016 Corrie Mckusick, DO [facility]    Social History   Social History  . Marital status: Married    Spouse name: N/A  . Number of children: N/A  . Years of education: N/A   Occupational History  . Not on file.   Social History Main Topics  . Smoking status: Never Smoker  . Smokeless tobacco: Never Used  . Alcohol use No  . Drug use: No  . Sexual activity: Yes    Birth control/ protection: Condom   Other Topics Concern  . Not on file   Social History Narrative   Mostly bilingual but more comfortable speaking Spanish     Vitals:   11/24/16 0856  BP: (!) 195/80  Pulse: 66  Weight: 129 lb (58.5 kg)  Height: 5\' 4"  (1.626 m)    Wt Readings from Last 3 Encounters:  11/24/16 129 lb (58.5 kg)  10/13/16 131 lb 12.8 oz (59.8 kg)  10/08/16 130 lb 6.4 oz (59.1 kg)     PHYSICAL EXAM General: NAD HEENT: Normal. Neck: No JVD, no thyromegaly. Lungs: Clear to auscultation bilaterally with normal respiratory effort. CV: Nondisplaced PMI.  Regular rate and rhythm, normal S1/S2, no N0/N3, 3/6 holosystolic murmur heard at both left and right upper sternal borders with 2/4 holodiastolic murmur along left sternal border with a left infraclavicular murmur as well. No pretibial or periankle edema.  No carotid bruit.   Abdomen: Soft, nontender, no distention.  Neurologic: Alert and oriented.  Psych: Normal affect. Skin: Normal. Musculoskeletal: No gross deformities.    ECG: Most recent ECG reviewed.   Labs: Lab Results  Component Value Date/Time   K 4.0 10/08/2016 04:05 PM   BUN 59 (H) 10/08/2016 04:05 PM   CREATININE 9.87 (>) 10/08/2016 04:05 PM   CREATININE 1.39 (H) 11/20/2012 09:50 AM   ALT 10 10/08/2016 04:05 PM   HGB 10.9 (L) 07/07/2016 06:53 AM     Lipids: Lab Results  Component Value Date/Time   LDLCALC Comment 10/08/2016 04:05 PM   LDLCALC 54 09/24/2013 08:35 AM     LDLCALC 67 11/20/2012 09:50 AM   LDLDIRECT 123 (H) 06/21/2014 08:58 AM   CHOL 196 10/08/2016 04:05 PM   CHOL 133 11/20/2012 09:50 AM   TRIG 412 (H) 10/08/2016 04:05 PM   TRIG 352 (H) 09/24/2013 08:35 AM   TRIG 101 11/20/2012 09:50 AM   HDL 49 10/08/2016 04:05 PM   HDL 49 09/24/2013 08:35 AM   HDL 46 11/20/2012 09:50 AM       ASSESSMENT AND PLAN:  1. Cardiac murmur: As detailed above, echocardiogram demonstrated aortic valve sclerosis without stenosis and mild mitral and tricuspid regurgitation. He does not require a repeat echocardiogram unless there is a change in clinical status. Unable to get cardiac MRI due to gadolinium  and risk for nephrogenic fibrosis. He also has a left infraclavicular murmur. I wonder about coarctation as well as subclavian artery stenosis. I will obtain a CT angiogram and have him dialyzed afterwards.  2. Hypertension: Markedly elevated.I will increase hydralazine to 75 mg three times daily.  3. Hyperlipidemia: Continue Lipitor 20 mg daily.  4. ESRD: On peritoneal dialysis.   Disposition: Follow up 2 months  Time spent: 40 minutes, of which greater than 50% was spent reviewing symptoms, relevant blood tests and studies, and discussing management plan with the patient.   Kate Sable, M.D., F.A.C.C.

## 2016-11-24 NOTE — Telephone Encounter (Signed)
Pre-cert Verification for the following procedure   CT ANGIO CHEST scheduled for 12/09/16 @ [facility].

## 2016-11-24 NOTE — Patient Instructions (Addendum)
Medication Instructions:   Increase Hydralazine to 75mg  three times per day.    Continue all other current medications.  Labwork: BMET - order given today.  Testing/Procedures:  CT Angio - to be done at [facility].    Must do dialysis after the CT due to the contrast.    Office will contact with results via phone or letter.    Follow-Up: 2 months   Any Other Special Instructions Will Be Listed Below (If Applicable).  If you need a refill on your cardiac medications before your next appointment, please call your pharmacy.

## 2016-11-25 ENCOUNTER — Other Ambulatory Visit: Payer: Self-pay | Admitting: *Deleted

## 2016-11-25 DIAGNOSIS — I10 Essential (primary) hypertension: Secondary | ICD-10-CM

## 2016-11-25 MED ORDER — AMLODIPINE BESYLATE 10 MG PO TABS: 10.0000 mg | ORAL_TABLET | Freq: Every day | ORAL | 0 refills | Status: AC

## 2016-12-09 ENCOUNTER — Ambulatory Visit (HOSPITAL_COMMUNITY)
Admission: RE | Admit: 2016-12-09 | Discharge: 2016-12-09 | Disposition: A | Discharge status: Home / Self Care | Payer: Managed Care, Other (non HMO) | Source: Ambulatory Visit | Attending: Cardiovascular Disease | Admitting: Cardiovascular Disease

## 2016-12-09 DIAGNOSIS — I2510 Atherosclerotic heart disease of native coronary artery without angina pectoris: Secondary | ICD-10-CM | POA: Insufficient documentation

## 2016-12-09 DIAGNOSIS — R011 Cardiac murmur, unspecified: Secondary | ICD-10-CM | POA: Diagnosis not present

## 2016-12-09 MED ORDER — IOPAMIDOL (ISOVUE-370) INJECTION 76%
100.0000 mL | Freq: Once | INTRAVENOUS | Status: AC | PRN
  Administered 2016-12-09: 100 mL via INTRAVENOUS

## 2016-12-15 ENCOUNTER — Encounter (INDEPENDENT_AMBULATORY_CARE_PROVIDER_SITE_OTHER): Payer: Self-pay | Admitting: Internal Medicine

## 2016-12-15 ENCOUNTER — Telehealth: Payer: Self-pay | Admitting: *Deleted

## 2016-12-15 NOTE — Telephone Encounter (Signed)
Notes recorded by Laurine Blazer, LPN on 3/81/7711 at 6:57 AM EDT Daughter Chrissie Noa) notified. Copy to pmd. Follow up scheduled for 01/25/2017. ------  Notes recorded by Herminio Commons, MD on 12/09/2016 at 1:34 PM EDT No obvious abnormalities. Pt is asymptomatic. I have reviewed with vascular surgery. No further testing indicated. Can fu prn

## 2016-12-30 ENCOUNTER — Ambulatory Visit (INDEPENDENT_AMBULATORY_CARE_PROVIDER_SITE_OTHER): Payer: Self-pay | Admitting: Internal Medicine

## 2017-01-05 ENCOUNTER — Ambulatory Visit (INDEPENDENT_AMBULATORY_CARE_PROVIDER_SITE_OTHER): Payer: Managed Care, Other (non HMO) | Admitting: Internal Medicine

## 2017-01-05 ENCOUNTER — Encounter (INDEPENDENT_AMBULATORY_CARE_PROVIDER_SITE_OTHER): Payer: Self-pay | Admitting: Internal Medicine

## 2017-01-05 VITALS — BP 150/60 | HR 72 | Temp 98.3°F | Ht 64.0 in | Wt 135.4 lb

## 2017-01-05 DIAGNOSIS — R112 Nausea with vomiting, unspecified: Secondary | ICD-10-CM | POA: Diagnosis not present

## 2017-01-05 MED ORDER — PANTOPRAZOLE SODIUM 40 MG PO TBEC: 40.0000 mg | DELAYED_RELEASE_TABLET | Freq: Every day | ORAL | 2 refills | Status: DC

## 2017-01-05 NOTE — Progress Notes (Addendum)
Subjective:    Patient ID: Craig Fisher, male    DOB: 18-Aug-1960, 56 y.o.   MRN: [id number] PCP Evelina Dun FNP HPI Referred by Dr. Hinda Lenis due to epigastric discomfort, heartburn and nausea.  Ever since he had the device for his peritoneal dialysis in December of 2017. He wakes up every morning with nausea. He will vomit the stomach acid he feels better. Daughter states her father starts coughing and then will vomit.  He has epigastric tenderness in the morning. Denies prior hx of ulcer.  Today he feels good. He did vomit this morning. He does sometimes vomit in evening.  He performes home peritoneal dialysis.  Hc of ESRD on dialysis daily.  Hx of Diabetes. No NSAIDS. Appetite is good. No weight loss. BMs are normal. No melena or BRRB.  BMs x  3 a day.  Denies acid reflux.  Diabetic x 30 yrs.   Last colonoscopy in 2015 by Dr. Hilarie Fredrickson (see epic).   Review of Systems     Past Medical History:  Diagnosis Date  . Anemia   . Blind left eye   . Complication of anesthesia    Palpitations at dentist  . Diabetes mellitus without complication ([place])    Type II  . Diabetic retinopathy ([place])   . ESRD (end stage renal disease) ([place])    Hemodialysis TTHS  . Full dentures   . Headache   . Hyperlipidemia   . Hypertension   . Shortness of breath dyspnea    " only once after surgery, when going to bed" 07/06/16, patient denies shortness of breath  . Wears glasses     Past Surgical History:  Procedure Laterality Date  . AV FISTULA PLACEMENT Left 10/29/2015   Procedure: BRACHIOCEPHALIC ARTERIOVENOUS (AV) FISTULA CREATION;  Surgeon: Mal Misty, MD;  Location: [place];  Service: Vascular;  Laterality: Left;  . CAPD INSERTION N/A 07/07/2016   Procedure: LAPAROSCOPIC INSERTION CONTINUOUS AMBULATORY PERITONEAL DIALYSIS CATHETER WITH OMENTOPEXY;  Surgeon: Michael Boston, MD;  Location: [place];  Service: General;  Laterality: N/A;  . COLONOSCOPY     One small polyp  . EYE SURGERY     both  eyes-retine  . eyes     detached retina-lt  . INGUINAL HERNIA REPAIR Bilateral 07/07/2016   Procedure: REPAIR BILATERAL INGUINAL HERNIAS;  Surgeon: Michael Boston, MD;  Location: [place];  Service: General;  Laterality: Bilateral;  . INSERTION OF DIALYSIS CATHETER N/A 11/07/2015   Procedure: INSERTION OF DIALYSIS CATHETER;  Surgeon: Elam Dutch, MD;  Location: [place];  Service: Vascular;  Laterality: N/A;  . INSERTION OF MESH Bilateral 07/07/2016   Procedure: INSERTION OF MESH;  Surgeon: Michael Boston, MD;  Location: [place];  Service: General;  Laterality: Bilateral;  . IR GENERIC HISTORICAL  02/20/2016   IR REMOVAL TUN CV CATH W/O FL 02/20/2016 Corrie Mckusick, DO [facility]-INTER[facility]    No Known Allergies  Current Outpatient Prescriptions on File Prior to Visit  Medication Sig Dispense Refill  . amLODipine (NORVASC) 10 MG tablet Take 1 tablet (10 mg total) by mouth daily. 90 tablet 0  . atorvastatin (LIPITOR) 20 MG tablet Take 1 tablet (20 mg total) by mouth daily. 90 tablet 1  . hydrALAZINE (APRESOLINE) 50 MG tablet Take 1.5 tablets (75 mg total) by mouth 3 (three) times daily. 405 tablet 3  . Insulin Degludec (TRESIBA FLEXTOUCH) 200 UNIT/ML SOPN Inject 20 Units into the skin daily. 3 pen 11  . Insulin Pen Needle 31G X 4  MM MISC 1 application by Does not apply route 4 (four) times daily -  before meals and at bedtime. 100 each 11  . Insulin Syringe-Needle U-100 (INSULIN SYRINGE .5CC/31GX5/16") 31G X 5/16" 0.5 ML MISC Use to injection insulin once daily 100 each 1  . Insulin Syringes, Disposable, U-100 0.5 ML MISC Use to inject insulin once daily. 100 each 2  . multivitamin (RENA-VIT) TABS tablet Take 1 tablet by mouth daily.    Marland Kitchen omeprazole (PRILOSEC) 20 MG capsule Take 1 capsule (20 mg total) by mouth daily. 90 capsule 1  . sevelamer carbonate (RENVELA) 800 MG tablet Take 1-2 tablets by mouth See admin instructions. 2 tablets 3 times daily with meals and 1 tablet 2 times daily with snacks    . sodium  bicarbonate 650 MG tablet Take 650 mg by mouth 3 (three) times daily.      No current facility-administered medications on file prior to visit.      Objective:   Physical Exam Blood pressure (!) 150/60, pulse 72, temperature 98.3 F (36.8 C), height 5\' 4"  (1.626 m), weight 135 lb 6.4 oz (61.4 kg).  Alert and oriented. Skin warm and dry. Oral mucosa is moist.   . Sclera anicteric, conjunctivae is pink. Thyroid not enlarged. No cervical lymphadenopathy. Lungs clear. Heart regular rate and rhythm. Loud murmur head  Abdomen is soft. Bowel sounds are positive. No hepatomegaly. No abdominal masses felt. No tenderness.  No edema to lower extremities.          Assessment & Plan:  Nausea in am.? GERD. I will discuss with Dr Laural Golden.

## 2017-01-05 NOTE — Patient Instructions (Addendum)
Rx for Protonix 40mg  to his pharmacy. I will talk with Dr. Laural Golden about this.  Stop the Omeprazole.

## 2017-01-07 ENCOUNTER — Other Ambulatory Visit: Payer: Self-pay | Admitting: Family

## 2017-01-07 DIAGNOSIS — K219 Gastro-esophageal reflux disease without esophagitis: Secondary | ICD-10-CM

## 2017-01-14 ENCOUNTER — Other Ambulatory Visit: Payer: Self-pay | Admitting: Family

## 2017-01-21 ENCOUNTER — Encounter (INDEPENDENT_AMBULATORY_CARE_PROVIDER_SITE_OTHER): Payer: Self-pay

## 2017-01-25 ENCOUNTER — Ambulatory Visit: Payer: Self-pay | Admitting: Cardiovascular Disease

## 2017-02-18 ENCOUNTER — Ambulatory Visit: Payer: Self-pay | Admitting: Cardiovascular Disease

## 2017-02-25 ENCOUNTER — Other Ambulatory Visit: Payer: Self-pay | Admitting: Family

## 2017-03-08 ENCOUNTER — Telehealth (INDEPENDENT_AMBULATORY_CARE_PROVIDER_SITE_OTHER): Payer: Self-pay | Admitting: Internal Medicine

## 2017-03-08 NOTE — Telephone Encounter (Signed)
Patient's daughter Craig Fisher called, stated that the medication that her father was given has not helped.  She stated that Terri mentioned that if the medication did not help that he may need an endoscopy.  She would like to know what you recommend and does he need to come in for an office visit.  [phone] - pt's daughter Craig Fisher

## 2017-03-15 ENCOUNTER — Telehealth (INDEPENDENT_AMBULATORY_CARE_PROVIDER_SITE_OTHER): Payer: Self-pay | Admitting: Internal Medicine

## 2017-03-15 ENCOUNTER — Ambulatory Visit (INDEPENDENT_AMBULATORY_CARE_PROVIDER_SITE_OTHER): Payer: Managed Care, Other (non HMO) | Admitting: Cardiovascular Disease

## 2017-03-15 ENCOUNTER — Encounter: Payer: Self-pay | Admitting: Cardiovascular Disease

## 2017-03-15 ENCOUNTER — Other Ambulatory Visit (INDEPENDENT_AMBULATORY_CARE_PROVIDER_SITE_OTHER): Payer: Self-pay | Admitting: Internal Medicine

## 2017-03-15 VITALS — BP 142/74 | HR 63 | Ht 65.0 in | Wt 148.0 lb

## 2017-03-15 DIAGNOSIS — R1010 Upper abdominal pain, unspecified: Secondary | ICD-10-CM

## 2017-03-15 DIAGNOSIS — Z992 Dependence on renal dialysis: Secondary | ICD-10-CM | POA: Diagnosis not present

## 2017-03-15 DIAGNOSIS — N186 End stage renal disease: Secondary | ICD-10-CM

## 2017-03-15 DIAGNOSIS — I10 Essential (primary) hypertension: Secondary | ICD-10-CM

## 2017-03-15 DIAGNOSIS — R011 Cardiac murmur, unspecified: Secondary | ICD-10-CM | POA: Diagnosis not present

## 2017-03-15 DIAGNOSIS — E782 Mixed hyperlipidemia: Secondary | ICD-10-CM | POA: Diagnosis not present

## 2017-03-15 DIAGNOSIS — R112 Nausea with vomiting, unspecified: Secondary | ICD-10-CM

## 2017-03-15 NOTE — Progress Notes (Signed)
SUBJECTIVE: The patient returns for follow-up after undergoing cardiovascular testing performed for the evaluation of a murmur.  The patient denies any symptoms of chest pain, palpitations, shortness of breath, lightheadedness, dizziness, leg swelling, orthopnea, PND, and syncope.  He continues with peritoneal dialysis and is considering renal transplant.  He underwent a normal dobutamine stress echocardiogram on 02/04/17 at [facility].   Review of Systems: As per "subjective", otherwise negative.  No Known Allergies  Current Outpatient Prescriptions  Medication Sig Dispense Refill  . amLODipine (NORVASC) 10 MG tablet Take 1 tablet (10 mg total) by mouth daily. 90 tablet 0  . BD PEN NEEDLE NANO U/F 32G X 4 MM MISC USE 4 TIMES A DAY          ( DISCARD PEN NEEDLE AFTER EACH USE ) 200 each 0  . hydrALAZINE (APRESOLINE) 50 MG tablet Take 1.5 tablets (75 mg total) by mouth 3 (three) times daily. 405 tablet 3  . Insulin Degludec (TRESIBA FLEXTOUCH) 200 UNIT/ML SOPN Inject 20 Units into the skin daily. 3 pen 11  . Insulin Pen Needle 31G X 4 MM MISC 1 application by Does not apply route 4 (four) times daily -  before meals and at bedtime. 100 each 11  . Insulin Syringe-Needle U-100 (INSULIN SYRINGE .5CC/31GX5/16") 31G X 5/16" 0.5 ML MISC Use to injection insulin once daily 100 each 1  . Insulin Syringes, Disposable, U-100 0.5 ML MISC Use to inject insulin once daily. 100 each 2  . LIPITOR 20 MG tablet TAKE 1 BY MOUTH DAILY 90 tablet 0  . multivitamin (RENA-VIT) TABS tablet Take 1 tablet by mouth daily.    . pantoprazole (PROTONIX) 40 MG tablet Take 1 tablet (40 mg total) by mouth daily. 30 tablet 2  . PRILOSEC 20 MG capsule TAKE 1 BY MOUTH DAILY 90 capsule 0  . sevelamer carbonate (RENVELA) 800 MG tablet Take 1-2 tablets by mouth See admin instructions. 2 tablets 3 times daily with meals and 1 tablet 2 times daily with snacks     No current facility-administered  medications for this visit.     Past Medical History:  Diagnosis Date  . Anemia   . Blind left eye   . Complication of anesthesia    Palpitations at dentist  . Diabetes mellitus without complication ([place])    Type II  . Diabetic retinopathy ([place])   . ESRD (end stage renal disease) ([place])    Hemodialysis TTHS  . Full dentures   . Headache   . Hyperlipidemia   . Hypertension   . Shortness of breath dyspnea    " only once after surgery, when going to bed" 07/06/16, patient denies shortness of breath  . Wears glasses     Past Surgical History:  Procedure Laterality Date  . AV FISTULA PLACEMENT Left 10/29/2015   Procedure: BRACHIOCEPHALIC ARTERIOVENOUS (AV) FISTULA CREATION;  Surgeon: Mal Misty, MD;  Location: [place];  Service: Vascular;  Laterality: Left;  . CAPD INSERTION N/A 07/07/2016   Procedure: LAPAROSCOPIC INSERTION CONTINUOUS AMBULATORY PERITONEAL DIALYSIS CATHETER WITH OMENTOPEXY;  Surgeon: Michael Boston, MD;  Location: [place];  Service: General;  Laterality: N/A;  . COLONOSCOPY     One small polyp  . EYE SURGERY     both eyes-retine  . eyes     detached retina-lt  . INGUINAL HERNIA REPAIR Bilateral 07/07/2016   Procedure: REPAIR BILATERAL INGUINAL HERNIAS;  Surgeon: Michael Boston, MD;  Location: [place];  Service:  General;  Laterality: Bilateral;  . INSERTION OF DIALYSIS CATHETER N/A 11/07/2015   Procedure: INSERTION OF DIALYSIS CATHETER;  Surgeon: Elam Dutch, MD;  Location: [place];  Service: Vascular;  Laterality: N/A;  . INSERTION OF MESH Bilateral 07/07/2016   Procedure: INSERTION OF MESH;  Surgeon: Michael Boston, MD;  Location: [place];  Service: General;  Laterality: Bilateral;  . IR GENERIC HISTORICAL  02/20/2016   IR REMOVAL TUN CV CATH W/O FL 02/20/2016 Corrie Mckusick, DO [facility]    Social History   Social History  . Marital status: Married    Spouse name: N/A  . Number of children: N/A  . Years of education: N/A   Occupational History  . Not on file.     Social History Main Topics  . Smoking status: Never Smoker  . Smokeless tobacco: Never Used  . Alcohol use No  . Drug use: No  . Sexual activity: Yes    Birth control/ protection: Condom   Other Topics Concern  . Not on file   Social History Narrative   Mostly bilingual but more comfortable speaking Spanish     Vitals:   03/15/17 1533  BP: (!) 142/74  Pulse: 63  SpO2: 98%  Weight: 148 lb (67.1 kg)  Height: 5\' 5"  (1.651 m)    Wt Readings from Last 3 Encounters:  03/15/17 148 lb (67.1 kg)  01/05/17 135 lb 6.4 oz (61.4 kg)  11/24/16 129 lb (58.5 kg)     PHYSICAL EXAM General: NAD HEENT: Normal. Neck: No JVD, no thyromegaly. Lungs: Clear to auscultation bilaterally with normal respiratory effort. CV: Nondisplaced PMI.  Regular rate and rhythm, normal S1/S2, no T0/G2, 3/6 holosystolic murmur heard at both left and right upper sternal borders with 2/4 holodiastolic murmur along left sternal border with a left infraclavicularmurmur as well. No pretibial or periankle edema.    Abdomen: Soft, nontender, no distention.  Neurologic: Alert and oriented.  Psych: Normal affect. Skin: Normal. Musculoskeletal: No gross deformities.    ECG: Most recent ECG reviewed.   Labs: Lab Results  Component Value Date/Time   K 4.0 10/08/2016 04:05 PM   BUN 59 (H) 10/08/2016 04:05 PM   CREATININE 9.87 (>) 10/08/2016 04:05 PM   CREATININE 1.39 (H) 11/20/2012 09:50 AM   ALT 10 10/08/2016 04:05 PM   HGB 10.9 (L) 07/07/2016 06:53 AM     Lipids: Lab Results  Component Value Date/Time   LDLCALC Comment 10/08/2016 04:05 PM   LDLCALC 54 09/24/2013 08:35 AM   LDLCALC 67 11/20/2012 09:50 AM   LDLDIRECT 123 (H) 06/21/2014 08:58 AM   CHOL 196 10/08/2016 04:05 PM   CHOL 133 11/20/2012 09:50 AM   TRIG 412 (H) 10/08/2016 04:05 PM   TRIG 352 (H) 09/24/2013 08:35 AM   TRIG 101 11/20/2012 09:50 AM   HDL 49 10/08/2016 04:05 PM   HDL 49 09/24/2013 08:35 AM   HDL 46 11/20/2012 09:50 AM        ASSESSMENT AND PLAN:  1. Cardiac murmur: Echocardiogram demonstrated aortic valve sclerosis without stenosis and mild mitral and tricuspid regurgitation. Unable to get cardiac MRI due to gadolinium and risk for nephrogenic fibrosis. CT angiography showed no significant vascular abnormalities.  2. Hypertension:Mildly elevated. Needs further monitoring. I increased hydralazine to 75 mg tid at last visit.  3. Hyperlipidemia: Continue Lipitor 20 mg daily.  4. ESRD: On peritoneal dialysis.     Disposition: Follow up prn   Kate Sable, M.D., F.A.C.C.

## 2017-03-15 NOTE — Telephone Encounter (Signed)
I have spoken with daughter. Protonix has not helped. Will schedule and EGD

## 2017-03-15 NOTE — Telephone Encounter (Signed)
The below message was sent last week.  The daughter called again today and hasn't heard anything and was following up.   Patient's daughter Chrissie Noa called, stated that the medication that her father was given has not helped.  She stated that Terri mentioned that if the medication did not help that he may need an endoscopy.  She would like to know what you recommend and does he need to come in for an office visit.  [phone] - pt's daughter Chrissie Noa

## 2017-03-15 NOTE — Progress Notes (Signed)
Ann, EGD. I have spoken with daughter.

## 2017-03-15 NOTE — Telephone Encounter (Signed)
Craig Fisher, EGD. I have spoken with Daughter

## 2017-03-15 NOTE — Patient Instructions (Signed)
Medication Instructions:  Continue all current medications.  Labwork: none  Testing/Procedures: none  Follow-Up: As needed.    Any Other Special Instructions Will Be Listed Below (If Applicable).  If you need a refill on your cardiac medications before your next appointment, please call your pharmacy.  

## 2017-03-16 ENCOUNTER — Encounter (INDEPENDENT_AMBULATORY_CARE_PROVIDER_SITE_OTHER): Payer: Self-pay | Admitting: *Deleted

## 2017-03-16 DIAGNOSIS — R112 Nausea with vomiting, unspecified: Secondary | ICD-10-CM | POA: Insufficient documentation

## 2017-03-16 DIAGNOSIS — R109 Unspecified abdominal pain: Secondary | ICD-10-CM | POA: Insufficient documentation

## 2017-03-16 DIAGNOSIS — R1010 Upper abdominal pain, unspecified: Secondary | ICD-10-CM | POA: Insufficient documentation

## 2017-03-16 NOTE — Telephone Encounter (Signed)
EGD sch'd 04/05/14 at 2620 (11:15),left detailed message for patient's daughter, instructions mailed

## 2017-03-16 NOTE — Telephone Encounter (Signed)
EGD sch'd 04/05/14 at 1643 (11:15),left detailed message for patient's daughter, instructions mailed

## 2017-04-05 ENCOUNTER — Encounter (HOSPITAL_COMMUNITY): Payer: Self-pay

## 2017-04-05 ENCOUNTER — Encounter (HOSPITAL_COMMUNITY)
Admission: RE | Disposition: A | Discharge status: Home / Self Care | Payer: Self-pay | Source: Ambulatory Visit | Attending: Internal Medicine

## 2017-04-05 ENCOUNTER — Ambulatory Visit (HOSPITAL_COMMUNITY)
Admission: RE | Admit: 2017-04-05 | Discharge: 2017-04-05 | Disposition: A | Discharge status: Home / Self Care | Payer: Managed Care, Other (non HMO) | Source: Ambulatory Visit | Attending: Internal Medicine | Admitting: Internal Medicine

## 2017-04-05 DIAGNOSIS — R109 Unspecified abdominal pain: Secondary | ICD-10-CM | POA: Insufficient documentation

## 2017-04-05 DIAGNOSIS — E11319 Type 2 diabetes mellitus with unspecified diabetic retinopathy without macular edema: Secondary | ICD-10-CM | POA: Insufficient documentation

## 2017-04-05 DIAGNOSIS — E1122 Type 2 diabetes mellitus with diabetic chronic kidney disease: Secondary | ICD-10-CM | POA: Insufficient documentation

## 2017-04-05 DIAGNOSIS — K2950 Unspecified chronic gastritis without bleeding: Secondary | ICD-10-CM | POA: Insufficient documentation

## 2017-04-05 DIAGNOSIS — I120 Hypertensive chronic kidney disease with stage 5 chronic kidney disease or end stage renal disease: Secondary | ICD-10-CM | POA: Insufficient documentation

## 2017-04-05 DIAGNOSIS — R1010 Upper abdominal pain, unspecified: Secondary | ICD-10-CM

## 2017-04-05 DIAGNOSIS — Z79899 Other long term (current) drug therapy: Secondary | ICD-10-CM | POA: Diagnosis not present

## 2017-04-05 DIAGNOSIS — N186 End stage renal disease: Secondary | ICD-10-CM | POA: Insufficient documentation

## 2017-04-05 DIAGNOSIS — K228 Other specified diseases of esophagus: Secondary | ICD-10-CM | POA: Diagnosis not present

## 2017-04-05 DIAGNOSIS — Z992 Dependence on renal dialysis: Secondary | ICD-10-CM | POA: Insufficient documentation

## 2017-04-05 DIAGNOSIS — K2970 Gastritis, unspecified, without bleeding: Secondary | ICD-10-CM | POA: Diagnosis not present

## 2017-04-05 DIAGNOSIS — R1110 Vomiting, unspecified: Secondary | ICD-10-CM | POA: Insufficient documentation

## 2017-04-05 DIAGNOSIS — R1013 Epigastric pain: Secondary | ICD-10-CM

## 2017-04-05 DIAGNOSIS — E785 Hyperlipidemia, unspecified: Secondary | ICD-10-CM | POA: Diagnosis not present

## 2017-04-05 DIAGNOSIS — Z794 Long term (current) use of insulin: Secondary | ICD-10-CM | POA: Insufficient documentation

## 2017-04-05 DIAGNOSIS — R112 Nausea with vomiting, unspecified: Secondary | ICD-10-CM | POA: Insufficient documentation

## 2017-04-05 HISTORY — PX: ESOPHAGOGASTRODUODENOSCOPY: SHX5428

## 2017-04-05 LAB — GLUCOSE, CAPILLARY: Glucose-Capillary: 111 mg/dL — ABNORMAL HIGH (ref 65–99)

## 2017-04-05 SURGERY — EGD (ESOPHAGOGASTRODUODENOSCOPY)
Anesthesia: Moderate Sedation

## 2017-04-05 MED ORDER — SODIUM CHLORIDE 0.9 % IV SOLN
INTRAVENOUS | Status: DC
  Administered 2017-04-05: 12:00:00 via INTRAVENOUS

## 2017-04-05 MED ORDER — FENTANYL CITRATE (PF) 100 MCG/2ML IJ SOLN
INTRAMUSCULAR | Status: DC | PRN
  Administered 2017-04-05 (×2): 25 ug via INTRAVENOUS

## 2017-04-05 MED ORDER — STERILE WATER FOR IRRIGATION IR SOLN
Status: DC | PRN
  Administered 2017-04-05: 100 mL

## 2017-04-05 MED ORDER — FENTANYL CITRATE (PF) 100 MCG/2ML IJ SOLN
INTRAMUSCULAR | Status: AC
  Filled 2017-04-05: qty 2

## 2017-04-05 MED ORDER — MIDAZOLAM HCL 5 MG/5ML IJ SOLN
INTRAMUSCULAR | Status: AC
  Filled 2017-04-05: qty 10

## 2017-04-05 MED ORDER — LIDOCAINE VISCOUS 2 % MT SOLN
OROMUCOSAL | Status: AC
  Filled 2017-04-05: qty 15

## 2017-04-05 MED ORDER — LIDOCAINE VISCOUS 2 % MT SOLN
OROMUCOSAL | Status: DC | PRN
  Administered 2017-04-05: 4 mL via OROMUCOSAL

## 2017-04-05 MED ORDER — MIDAZOLAM HCL 5 MG/5ML IJ SOLN
INTRAMUSCULAR | Status: DC | PRN
  Administered 2017-04-05 (×3): 1 mg via INTRAVENOUS
  Administered 2017-04-05: 2 mg via INTRAVENOUS

## 2017-04-05 MED ORDER — MEPERIDINE HCL 50 MG/ML IJ SOLN
INTRAMUSCULAR | Status: AC
  Filled 2017-04-05: qty 1

## 2017-04-05 NOTE — H&P (Signed)
Craig Fisher is an 56 y.o. male.   Chief Complaint: Patient is here for EGD. HPI: patient is 29 old male who presents with 9 month history of intermittent nausea vomiting and sporadic epigastric pain. These symptoms started when he had PD catheter placed back  In December 2017. He also has frequent regurgitation with cough followed by vomiting.he generally vomits clear fluid. He denies hematemesis or melena. He has lost close to 5 pounds.He does not feel much better with PPI. He has been diabetic for 27 years.  Past Medical History:  Diagnosis Date  . Anemia   . Blind left eye   . Complication of anesthesia    Palpitations at dentist  . Diabetes mellitus without complication ([place])    Type II  . Diabetic retinopathy ([place])   . ESRD (end stage renal disease) ([place])    Hemodialysis TTHS  . Full dentures   . Headache   . Hyperlipidemia   . Hypertension   . Shortness of breath dyspnea    " only once after surgery, when going to bed" 07/06/16, patient denies shortness of breath  . Wears glasses     Past Surgical History:  Procedure Laterality Date  . AV FISTULA PLACEMENT Left 10/29/2015   Procedure: BRACHIOCEPHALIC ARTERIOVENOUS (AV) FISTULA CREATION;  Surgeon: Mal Misty, MD;  Location: [place];  Service: Vascular;  Laterality: Left;  . CAPD INSERTION N/A 07/07/2016   Procedure: LAPAROSCOPIC INSERTION CONTINUOUS AMBULATORY PERITONEAL DIALYSIS CATHETER WITH OMENTOPEXY;  Surgeon: Michael Boston, MD;  Location: [place];  Service: General;  Laterality: N/A;  . COLONOSCOPY     One small polyp  . EYE SURGERY     both eyes-retine  . eyes     detached retina-lt  . INGUINAL HERNIA REPAIR Bilateral 07/07/2016   Procedure: REPAIR BILATERAL INGUINAL HERNIAS;  Surgeon: Michael Boston, MD;  Location: [place];  Service: General;  Laterality: Bilateral;  . INSERTION OF DIALYSIS CATHETER N/A 11/07/2015   Procedure: INSERTION OF DIALYSIS CATHETER;  Surgeon: Elam Dutch, MD;  Location: [place];  Service:  Vascular;  Laterality: N/A;  . INSERTION OF MESH Bilateral 07/07/2016   Procedure: INSERTION OF MESH;  Surgeon: Michael Boston, MD;  Location: [place];  Service: General;  Laterality: Bilateral;  . IR GENERIC HISTORICAL  02/20/2016   IR REMOVAL TUN CV CATH W/O FL 02/20/2016 Corrie Mckusick, DO [facility]    Family History  Problem Relation Age of Onset  . Diabetes Mother   . Diabetes Sister   . Esophageal cancer Neg Hx   . Colon cancer Neg Hx   . Rectal cancer Neg Hx   . Stomach cancer Neg Hx    Social History:  reports that he has never smoked. He has never used smokeless tobacco. He reports that he does not drink alcohol or use drugs.  Allergies: No Known Allergies  Medications Prior to Admission  Medication Sig Dispense Refill  . amLODipine (NORVASC) 10 MG tablet Take 1 tablet (10 mg total) by mouth daily. 90 tablet 0  . hydrALAZINE (APRESOLINE) 50 MG tablet Take 1.5 tablets (75 mg total) by mouth 3 (three) times daily. 405 tablet 3  . Insulin Degludec (TRESIBA FLEXTOUCH) 200 UNIT/ML SOPN Inject 20 Units into the skin daily. 3 pen 11  . insulin glargine (LANTUS) 100 UNIT/ML injection Inject 20 Units into the skin at bedtime.    Marland Kitchen LIPITOR 20 MG tablet TAKE 1 BY MOUTH DAILY 90 tablet 0  . multivitamin (RENA-VIT) TABS tablet Take  1 tablet by mouth daily.    Marland Kitchen PRESCRIPTION MEDICATION Timolol eye drops. Daughter was unsure of strength and instructions. The pharmacy had no record.    Marland Kitchen PRILOSEC 20 MG capsule TAKE 1 BY MOUTH DAILY 90 capsule 0  . sevelamer carbonate (RENVELA) 800 MG tablet Take 1,600-3,200 tablets by mouth See admin instructions. 4 tablets 3 times daily with meals and 2 tablets 2 times daily with snacks    . pantoprazole (PROTONIX) 40 MG tablet Take 1 tablet (40 mg total) by mouth daily. (Patient not taking: Reported on 04/01/2017) 30 tablet 2    Results for orders placed or performed during the hospital encounter of 04/05/17 (from the past 48 hour(s))  Glucose, capillary      Status: Abnormal   Collection Time: 04/05/17 11:45 AM  Result Value Ref Range   Glucose-Capillary 111 (H) 65 - 99 mg/dL   No results found.  ROS  Blood pressure (!) 152/67, pulse 64, temperature 98.4 F (36.9 C), temperature source Oral, resp. rate 12, SpO2 99 %. Physical Exam  Constitutional: He appears well-developed and well-nourished.  HENT:  Mouth/Throat: Oropharynx is clear and moist.  Eyes: Conjunctivae are normal. No scleral icterus.  Neck: No thyromegaly present.  Cardiovascular:  Regular rhythm normal S1 and S2. Loud systolic murmur noted all over the precordium but best heard at upper left sternal  Respiratory: Effort normal and breath sounds normal.  GI:  Abdomen is symmetrical. PD catheter In place.Abdomen is nontender without organomegaly or masses.  Musculoskeletal: He exhibits no edema.  Lymphadenopathy:    He has no cervical adenopathy.  Neurological: He is alert.  Skin: Skin is warm and dry.     Assessment/Plan Chronic nausea vomiting and epigastric pain. Diagnostic EGD.  Hildred Laser, MD 04/05/2017, 1:19 PM

## 2017-04-05 NOTE — Op Note (Signed)
[facility] Patient Name: Craig Fisher Procedure Date: 04/05/2017 1:11 PM MRN: [id number] Date of Birth: [birth year] Attending MD: Hildred Laser , MD CSN: [id number] Age: 56 Admit Type: Outpatient Procedure:                Upper GI endoscopy Indications:              Epigastric abdominal pain, Nausea with vomiting Providers:                Hildred Laser, MD, Rosina Lowenstein, RN, Randa Spike, Technician Referring MD:             Fran Lowes, MD Medicines:                Lidocaine spray, Fentanyl 50 micrograms IV,                            Midazolam 5 mg IV Complications:            No immediate complications. Estimated Blood Loss:     Estimated blood loss was minimal. Procedure:                Pre-Anesthesia Assessment:                           - Prior to the procedure, a History and Physical                            was performed, and patient medications and                            allergies were reviewed. The patient's tolerance of                            previous anesthesia was also reviewed. The risks                            and benefits of the procedure and the sedation                            options and risks were discussed with the patient.                            All questions were answered, and informed consent                            was obtained. Prior Anticoagulants: The patient has                            taken no previous anticoagulant or antiplatelet                            agents. ASA Grade Assessment: III - A patient with  severe systemic disease. After reviewing the risks                            and benefits, the patient was deemed in                            satisfactory condition to undergo the procedure.                           After obtaining informed consent, the endoscope was                            passed under direct vision. Throughout the        procedure, the patient's blood pressure, pulse, and                            oxygen saturations were monitored continuously. The                            EG29-iL0 ([id number]) scope was introduced through the                            mouth, and advanced to the second part of duodenum.                            The upper GI endoscopy was accomplished without                            difficulty. The patient tolerated the procedure                            well. Scope In: 1:33:15 PM Scope Out: 1:42:17 PM Total Procedure Duration: 0 hours 9 minutes 2 seconds  Findings:      The examined esophagus was normal.      The Z-line was irregular and was found 38 cm from the incisors.      Patchy mild inflammation characterized by congestion (edema) and       granularity was found in the gastric antrum. Biopsies were taken with a       cold forceps for histology.      The exam of the stomach was otherwise normal.      The duodenal bulb and second portion of the duodenum were normal. Impression:               - Normal esophagus.                           - Z-line irregular, 38 cm from the incisors.                           - Gastritis. Biopsied.                           - Normal duodenal bulb and second portion of the  duodenum. Moderate Sedation:      Moderate (conscious) sedation was administered by the endoscopy nurse       and supervised by the endoscopist. The following parameters were       monitored: oxygen saturation, heart rate, blood pressure, CO2       capnography and response to care. Total physician intraservice time was       15 minutes. Recommendation:           - Patient has a contact number available for                            emergencies. The signs and symptoms of potential                            delayed complications were discussed with the                            patient. Return to normal activities tomorrow.                             Written discharge instructions were provided to the                            patient.                           - Resume previous diet today.                           - Continue present medications.                           - Await pathology results. Procedure Code(s):        --- Professional ---                           [phone], Esophagogastroduodenoscopy, flexible,                            transoral; with biopsy, single or multiple                           99152, Moderate sedation services provided by the                            same physician or other qualified health care                            professional performing the diagnostic or                            therapeutic service that the sedation supports,                            requiring the presence of an independent trained  observer to assist in the monitoring of the                            patient's level of consciousness and physiological                            status; initial 15 minutes of intraservice time,                            patient age 22 years or older Diagnosis Code(s):        --- Professional ---                           K22.8, Other specified diseases of esophagus                           K29.70, Gastritis, unspecified, without bleeding                           R10.13, Epigastric pain                           R11.2, Nausea with vomiting, unspecified CPT copyright 2016 American Medical Association. All rights reserved. The codes documented in this report are preliminary and upon coder review may  be revised to meet current compliance requirements. Hildred Laser, MD Hildred Laser, MD 04/05/2017 1:54:50 PM This report has been signed electronically. Number of Addenda: 0

## 2017-04-05 NOTE — Progress Notes (Signed)
Please excuse Craig Fisher from work on 04/04/17 & 04/06/17 due to a procedure he underwent with sedation.  He is safe to return to work on 04/07/17

## 2017-04-05 NOTE — Discharge Instructions (Signed)
Take omeprazole or Prilosec 30 minutes before evening meal daily. Resume other medications and diet as before. No driving for 24 hours. Physician will call with biopsy results and further recommendations.  Esophagogastroduodenoscopy, Care After Refer to this sheet in the next few weeks. These instructions provide you with information about caring for yourself after your procedure. Your health care provider may also give you more specific instructions. Your treatment has been planned according to current medical practices, but problems sometimes occur. Call your health care provider if you have any problems or questions after your procedure. What can I expect after the procedure? After the procedure, it is common to have:  A sore throat.  Nausea.  Bloating.  Dizziness.  Fatigue.  Follow these instructions at home:  Do not eat or drink anything until the numbing medicine (local anesthetic) has worn off and your gag reflex has returned. You will know that the local anesthetic has worn off when you can swallow comfortably.  Do not drive for 24 hours if you received a medicine to help you relax (sedative).  If your health care provider took a tissue sample for testing during the procedure, make sure to get your test results. This is your responsibility. Ask your health care provider or the department performing the test when your results will be ready.  Keep all follow-up visits as told by your health care provider. This is important. Contact a health care provider if:  You cannot stop coughing.  You are not urinating.  You are urinating less than usual. Get help right away if:  You have trouble swallowing.  You cannot eat or drink.  You have throat or chest pain that gets worse.  You are dizzy or light-headed.  You faint.  You have nausea or vomiting.  You have chills.  You have a fever.  You have severe abdominal pain.  You have black, tarry, or bloody  stools. This information is not intended to replace advice given to you by your health care provider. Make sure you discuss any questions you have with your health care provider. Document Released: 06/21/2012 Document Revised: 12/11/2015 Document Reviewed: 05/29/2015 Elsevier Interactive Patient Education  2018 [place], cuidados posteriores (Esophagogastroduodenoscopy, Care After) Siga estas instrucciones durante las prximas semanas. Estas indicaciones le proporcionan informacin acerca de cmo deber cuidarse despus del procedimiento. El mdico tambin podr darle instrucciones ms especficas. El tratamiento ha sido planificado segn las prcticas mdicas actuales, pero en algunos casos pueden ocurrir problemas. Comunquese con el mdico si tiene algn problema o dudas despus del procedimiento. QU ESPERAR DESPUS DEL PROCEDIMIENTO Despus del procedimiento, es comn [employer] siguientes sntomas:  Dolor de Investment banker, operational.  Nuseas.  Meteorismo.  Mareos.  Fatiga. INSTRUCCIONES PARA EL CUIDADO EN EL HOGAR  No coma ni beba nada hasta que el efecto del anestsico (anestesia local) haya desaparecido y vuelva a tener reflejo farngeo. Si puede tragar con comodidad, significa que el efecto de la anestesia local ha desaparecido.  No conduzca durante 24horas si le dieron un medicamento para ayudarlo a que se relaje (sedante).  Si durante el procedimiento el mdico tom [place] de tejido para Personnel officer, asegrese de [employer] de la prueba. Esta es su responsabilidad. Consulte a su mdico o en el departamento donde se realice el estudio cundo estarn [employer].  Concurra a todas las visitas de control como se lo haya indicado el mdico. Esto es importante. SOLICITE ATENCIN MDICA SI:  No puede dejar de toser.  No puede orinar.  Orina menos que lo habitual. SOLICITE ATENCIN MDICA DE INMEDIATO SI:  Presenta dificultad para  tragar.  No puede comer ni beber.  Tiene dolor de garganta o de pecho que [place].  Se siente mareado o sufre un desmayo.  Se desmaya.  Tiene nuseas o vmitos.  Tiene escalofros.  Tiene fiebre.  Siente un dolor abdominal intenso.  La materia fecal es negra, de aspecto alquitranado o sanguinolenta. Esta informacin no tiene Marine scientist el consejo del mdico. Asegrese de hacerle al mdico cualquier pregunta que tenga. Document Released: 10/30/2012 Document Revised: 10/27/2015 Document Reviewed: 05/29/2015 Elsevier Interactive Patient Education  [employer].

## 2017-04-07 ENCOUNTER — Encounter (HOSPITAL_COMMUNITY): Payer: Self-pay | Admitting: Internal Medicine

## 2017-04-11 ENCOUNTER — Other Ambulatory Visit (INDEPENDENT_AMBULATORY_CARE_PROVIDER_SITE_OTHER): Payer: Self-pay | Admitting: *Deleted

## 2017-04-11 DIAGNOSIS — R1010 Upper abdominal pain, unspecified: Secondary | ICD-10-CM

## 2017-04-11 DIAGNOSIS — R1115 Cyclical vomiting syndrome unrelated to migraine: Secondary | ICD-10-CM

## 2017-04-14 ENCOUNTER — Other Ambulatory Visit (INDEPENDENT_AMBULATORY_CARE_PROVIDER_SITE_OTHER): Payer: Self-pay | Admitting: Internal Medicine

## 2017-04-21 LAB — HELICOBACTER PYLORI  SPECIAL ANTIGEN
MICRO NUMBER: 81075280
SPECIMEN QUALITY: ADEQUATE

## 2017-04-26 ENCOUNTER — Other Ambulatory Visit (INDEPENDENT_AMBULATORY_CARE_PROVIDER_SITE_OTHER): Payer: Self-pay | Admitting: Internal Medicine

## 2017-04-26 DIAGNOSIS — R1013 Epigastric pain: Principal | ICD-10-CM

## 2017-04-26 DIAGNOSIS — G8929 Other chronic pain: Secondary | ICD-10-CM

## 2017-04-29 ENCOUNTER — Ambulatory Visit (HOSPITAL_COMMUNITY)
Admission: RE | Admit: 2017-04-29 | Payer: Managed Care, Other (non HMO) | Source: Ambulatory Visit | Admitting: Internal Medicine

## 2017-05-09 ENCOUNTER — Ambulatory Visit (HOSPITAL_COMMUNITY)
Admission: RE | Admit: 2017-05-09 | Discharge: 2017-05-09 | Disposition: A | Discharge status: Home / Self Care | Payer: Managed Care, Other (non HMO) | Source: Ambulatory Visit | Attending: Internal Medicine | Admitting: Internal Medicine

## 2017-05-09 DIAGNOSIS — G8929 Other chronic pain: Secondary | ICD-10-CM | POA: Diagnosis not present

## 2017-05-09 DIAGNOSIS — R1013 Epigastric pain: Secondary | ICD-10-CM | POA: Insufficient documentation

## 2017-05-09 DIAGNOSIS — R932 Abnormal findings on diagnostic imaging of liver and biliary tract: Secondary | ICD-10-CM | POA: Diagnosis not present

## 2017-05-13 ENCOUNTER — Other Ambulatory Visit (INDEPENDENT_AMBULATORY_CARE_PROVIDER_SITE_OTHER): Payer: Self-pay | Admitting: Internal Medicine

## 2017-05-13 DIAGNOSIS — R112 Nausea with vomiting, unspecified: Secondary | ICD-10-CM

## 2017-05-18 ENCOUNTER — Encounter (HOSPITAL_COMMUNITY)
Admission: RE | Admit: 2017-05-18 | Discharge: 2017-05-18 | Disposition: A | Discharge status: Home / Self Care | Payer: Managed Care, Other (non HMO) | Source: Ambulatory Visit | Attending: Internal Medicine | Admitting: Internal Medicine

## 2017-05-18 ENCOUNTER — Encounter (HOSPITAL_COMMUNITY): Payer: Self-pay

## 2017-05-18 DIAGNOSIS — R112 Nausea with vomiting, unspecified: Secondary | ICD-10-CM | POA: Insufficient documentation

## 2017-05-18 MED ORDER — TECHNETIUM TC 99M SULFUR COLLOID
2.0000 | Freq: Once | INTRAVENOUS | Status: AC | PRN
  Administered 2017-05-18: 2.2 via ORAL

## 2017-05-19 ENCOUNTER — Other Ambulatory Visit (INDEPENDENT_AMBULATORY_CARE_PROVIDER_SITE_OTHER): Payer: Self-pay | Admitting: Internal Medicine

## 2017-05-19 MED ORDER — METOCLOPRAMIDE HCL 5 MG PO TABS: 5.0000 mg | ORAL_TABLET | Freq: Three times a day (TID) | ORAL | 0 refills | Status: AC

## 2017-05-30 ENCOUNTER — Encounter (INDEPENDENT_AMBULATORY_CARE_PROVIDER_SITE_OTHER): Payer: Self-pay | Admitting: Internal Medicine

## 2017-05-30 NOTE — Progress Notes (Signed)
Patient was given an appointment for 06/20/17 at 10:45am.  A letter was sent to the patient.

## 2017-06-20 ENCOUNTER — Ambulatory Visit (INDEPENDENT_AMBULATORY_CARE_PROVIDER_SITE_OTHER): Payer: Self-pay | Admitting: Internal Medicine

## 2017-07-31 IMAGING — CR DG CHEST 1V PORT
1 series · 1 of 1 positions shown · non-contrast
Comparison: None.

CLINICAL DATA: Hemodialysis catheter placement

EXAM:
PORTABLE CHEST 1 VIEW

[AP]
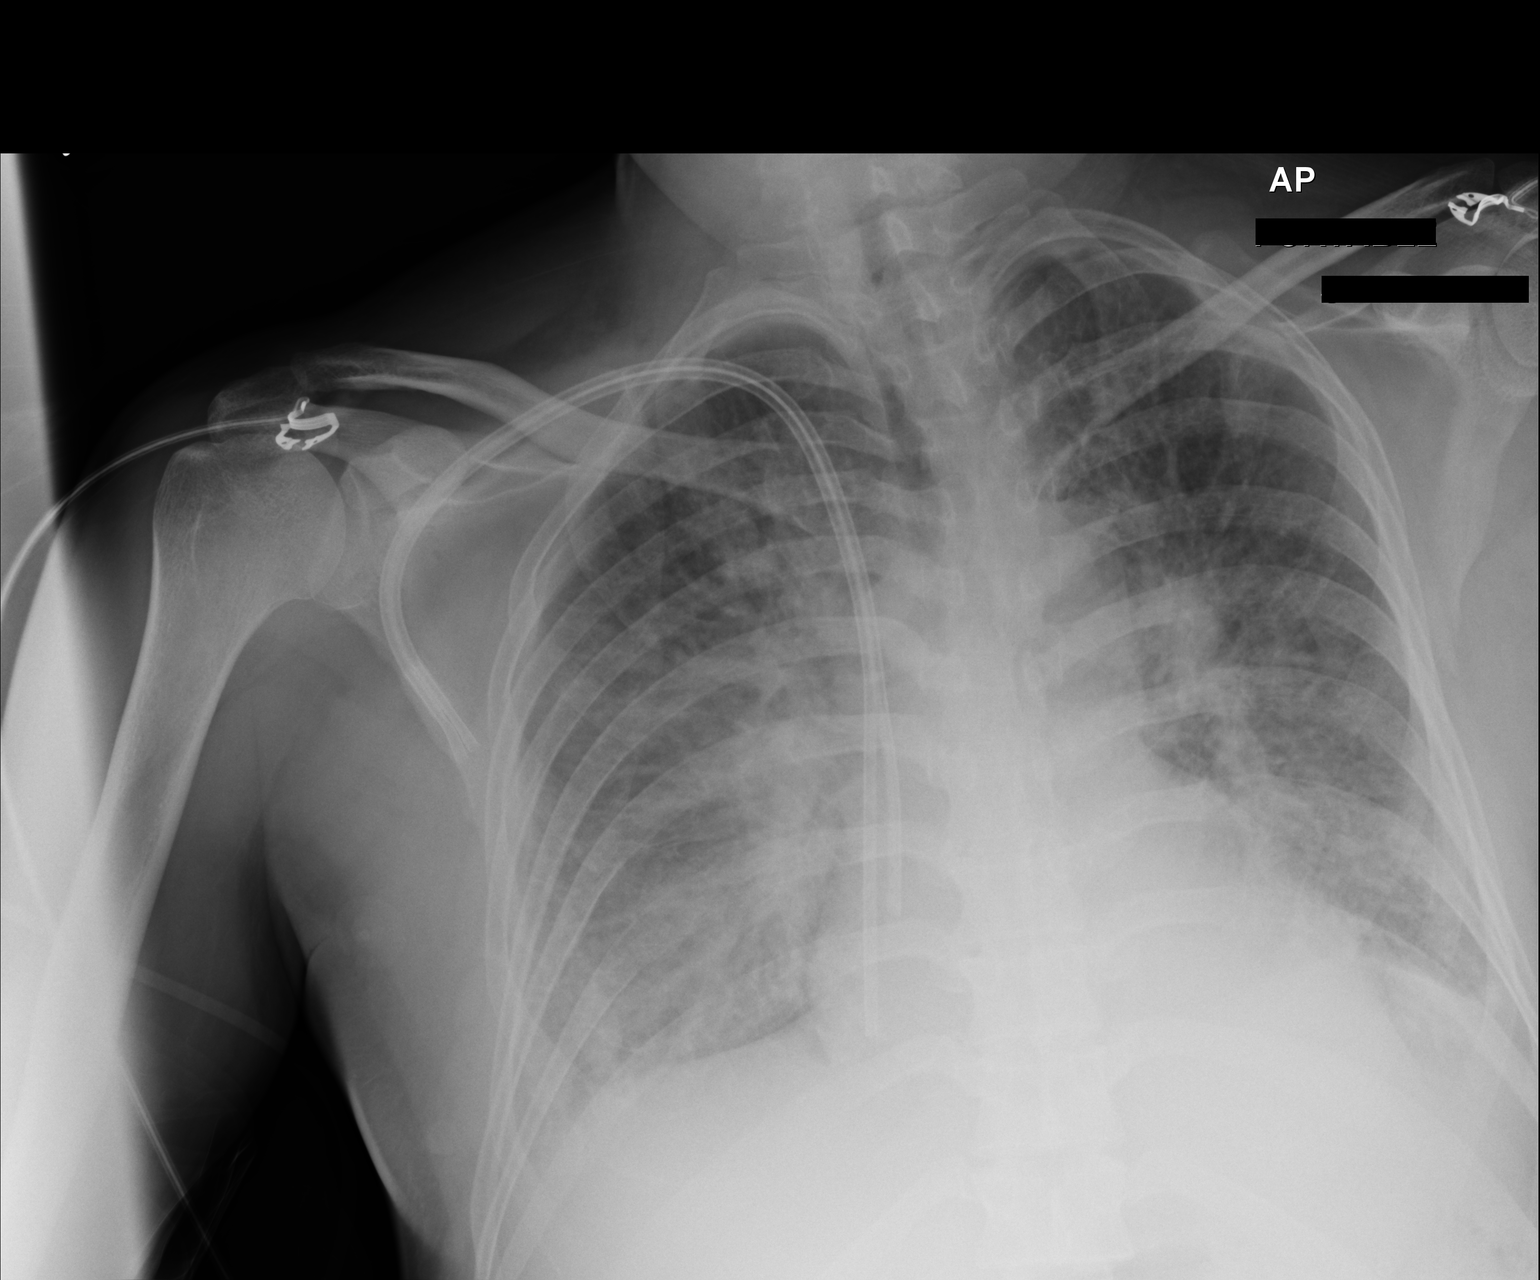

[1 of 1 positions shown; findings below may reference images not displayed]

FINDINGS: Right internal jugular central venous catheter terminates at the
cavoatrial junction. Top-normal heart size. Normal mediastinal
contour. No pneumothorax. Small bilateral pleural effusions.
Moderate pulmonary edema.
IMPRESSION: 1. Right internal jugular central venous catheter terminates at the
cavoatrial junction. No pneumothorax.
2. Moderate pulmonary edema .
3. Small bilateral pleural effusions.

## 2017-11-09 ENCOUNTER — Other Ambulatory Visit (INDEPENDENT_AMBULATORY_CARE_PROVIDER_SITE_OTHER): Payer: Self-pay | Admitting: Radiology

## 2017-11-09 ENCOUNTER — Ambulatory Visit (INDEPENDENT_AMBULATORY_CARE_PROVIDER_SITE_OTHER): Payer: Managed Care, Other (non HMO) | Admitting: Orthopaedic Surgery

## 2017-11-09 ENCOUNTER — Encounter (INDEPENDENT_AMBULATORY_CARE_PROVIDER_SITE_OTHER): Payer: Self-pay | Admitting: Orthopaedic Surgery

## 2017-11-09 VITALS — BP 110/67 | HR 61 | Resp 18 | Ht 64.0 in | Wt 128.0 lb

## 2017-11-09 DIAGNOSIS — S62102A Fracture of unspecified carpal bone, left wrist, initial encounter for closed fracture: Secondary | ICD-10-CM

## 2017-11-09 MED ORDER — HYDROCODONE-ACETAMINOPHEN 5-325 MG PO TABS: ORAL_TABLET | ORAL | 0 refills | Status: AC

## 2017-11-09 NOTE — Progress Notes (Signed)
Office Visit Note   Patient: Craig Fisher           Date of Birth: 02-13-1961           MRN: [id number] Visit Date: 11/09/2017              Requested by: Sharion Balloon, [place],  [id number] PCP: Sharion Balloon, FNP   Assessment & Plan: Visit Diagnoses:  1. Closed fracture of left wrist, initial encounter     Plan: Nondisplaced left distal radial styloid fracture. continue with sugar tong splint.  Prescription for hydrocodone with Tylenol 11/19/2023 #20.  Office 1 week to repeat films  Follow-Up Instructions: Return in about 1 week (around 11/16/2017).   Orders:  No orders of the defined types were placed in this encounter.  No orders of the defined types were placed in this encounter.     Procedures: No procedures performed   Clinical Data: No additional findings.   Subjective: No chief complaint on file. 57 year old insulin-dependent diabetic fell off the back of a truck 5 days ago injuring his left wrist and left chest wall.  Seen at [facility] with x-rays demonstrating a nondisplaced radial styloid fracture.  Also had evidence of a left seventh rib fracture without displacement.  Placed in a sugar tong splint and sling.  Given a prescription for hydrocodone and referred to the office.  Presently comfortable.  Has some chest pain when he coughs but otherwise no problem breathing.  Eyes any numbness or tingling in his left hand Reviewed the films of his wrist on the PACS system.  Displaced fracture dorsally of the left distal radius styloid.  There is diffuse calcification of the radial and ulnar arteries extending into the hand consistent with his diabetes. He does work as a Glass blower/designer and has been able to function with his right upper extremity  HPI  Review of Systems  Constitutional: Negative for fatigue.  HENT: Negative for ear pain.   Eyes: Negative for pain.  Respiratory: Negative for shortness of breath.     Cardiovascular: Negative for leg swelling.  Gastrointestinal: Negative for constipation and diarrhea.  Genitourinary: Negative for difficulty urinating.  Musculoskeletal: Negative for neck pain.  Skin: Negative for rash.  Allergic/Immunologic: Negative for food allergies.  Neurological: Positive for weakness.  Hematological: Does not bruise/bleed easily.  Psychiatric/Behavioral: Positive for sleep disturbance.     Objective: Vital Signs: There were no vitals taken for this visit.  Physical Exam  Constitutional: He is oriented to person, place, and time. He appears well-developed and well-nourished.  HENT:  Mouth/Throat: Oropharynx is clear and moist.  Eyes: Pupils are equal, round, and reactive to light. EOM are normal.  Pulmonary/Chest: Effort normal.  Neurological: He is alert and oriented to person, place, and time.  Skin: Skin is warm and dry.  Psychiatric: He has a normal mood and affect. His behavior is normal.    Ortho Exam awake alert and oriented x3.  Comfortable sitting.  Sugar tong splint in place left upper extremity incorporating the elbow.  Good sensibility to fingers.  Minimal swelling of digits.  Good capillary refill.  He is not having any difficulty breathing but did have some left chest wall pain.  Specialty Comments:  No specialty comments available.  Imaging: No results found.   PMFS History: Patient Active Problem List   Diagnosis Date Noted  . Pain of upper abdomen 03/16/2017  . Non-intractable vomiting with nausea 03/16/2017  . GERD (  gastroesophageal reflux disease) 10/08/2016  . Peritoneal dialysis catheter placed 07/07/2016 07/07/2016  . ESRD (end stage renal disease) on dialysis ([place]) 07/07/2016  . ESRD on dialysis ([place]) 12/05/2015  . Chronic kidney disease requiring chronic dialysis ([place]) 10/21/2015  . Chronic kidney disease (CKD) stage G4/A1 04/07/2015  . Edema 08/20/2014  . Diabetes mellitus type 2 with complications, uncontrolled ([place])  05/06/2014  . Type 2 diabetes mellitus with diabetic nephropathy ([place]) 05/06/2014  . Bilateral inguinal hernia (BIH) s/p lap repair w mesh 07/07/2016 01/23/2014  . Diabetic retinopathy ([place]) 03/30/2013  . Essential hypertension 12/09/2012  . Hyperlipidemia 12/09/2012  . Helicobacter pylori antibody positive 12/09/2012   Past Medical History:  Diagnosis Date  . Anemia   . Blind left eye   . Complication of anesthesia    Palpitations at dentist  . Diabetes mellitus without complication ([place])    Type II  . Diabetic retinopathy ([place])   . ESRD (end stage renal disease) ([place])    Hemodialysis TTHS  . Full dentures   . Headache   . Hyperlipidemia   . Hypertension   . Shortness of breath dyspnea    " only once after surgery, when going to bed" 07/06/16, patient denies shortness of breath  . Wears glasses     Family History  Problem Relation Age of Onset  . Diabetes Mother   . Diabetes Sister   . Esophageal cancer Neg Hx   . Colon cancer Neg Hx   . Rectal cancer Neg Hx   . Stomach cancer Neg Hx     Past Surgical History:  Procedure Laterality Date  . AV FISTULA PLACEMENT Left 10/29/2015   Procedure: BRACHIOCEPHALIC ARTERIOVENOUS (AV) FISTULA CREATION;  Surgeon: Mal Misty, MD;  Location: [place];  Service: Vascular;  Laterality: Left;  . CAPD INSERTION N/A 07/07/2016   Procedure: LAPAROSCOPIC INSERTION CONTINUOUS AMBULATORY PERITONEAL DIALYSIS CATHETER WITH OMENTOPEXY;  Surgeon: Michael Boston, MD;  Location: [place];  Service: General;  Laterality: N/A;  . COLONOSCOPY     One small polyp  . ESOPHAGOGASTRODUODENOSCOPY N/A 04/05/2017   Procedure: ESOPHAGOGASTRODUODENOSCOPY (EGD);  Surgeon: Rogene Houston, MD;  [facility];  Service: Endoscopy;  Laterality: N/A;  12:15  . EYE SURGERY     both eyes-retine  . eyes     detached retina-lt  . INGUINAL HERNIA REPAIR Bilateral 07/07/2016   Procedure: REPAIR BILATERAL INGUINAL HERNIAS;  Surgeon: Michael Boston, MD;  Location:  [place];  Service: General;  Laterality: Bilateral;  . INSERTION OF DIALYSIS CATHETER N/A 11/07/2015   Procedure: INSERTION OF DIALYSIS CATHETER;  Surgeon: Elam Dutch, MD;  Location: [place];  Service: Vascular;  Laterality: N/A;  . INSERTION OF MESH Bilateral 07/07/2016   Procedure: INSERTION OF MESH;  Surgeon: Michael Boston, MD;  Location: [place];  Service: General;  Laterality: Bilateral;  . IR GENERIC HISTORICAL  02/20/2016   IR REMOVAL TUN CV CATH W/O FL 02/20/2016 Corrie Mckusick, DO [facility]-INTERV RAD   Social History   Occupational History  . Not on file  Tobacco Use  . Smoking status: Never Smoker  . Smokeless tobacco: Never Used  Substance and Sexual Activity  . Alcohol use: No    Alcohol/week: 0.0 oz  . Drug use: No  . Sexual activity: Yes    Birth control/protection: Condom

## 2017-11-16 ENCOUNTER — Encounter (INDEPENDENT_AMBULATORY_CARE_PROVIDER_SITE_OTHER): Payer: Self-pay | Admitting: Orthopaedic Surgery

## 2017-11-16 ENCOUNTER — Ambulatory Visit (INDEPENDENT_AMBULATORY_CARE_PROVIDER_SITE_OTHER): Payer: Managed Care, Other (non HMO) | Admitting: Orthopaedic Surgery

## 2017-11-16 ENCOUNTER — Ambulatory Visit (INDEPENDENT_AMBULATORY_CARE_PROVIDER_SITE_OTHER): Payer: Managed Care, Other (non HMO)

## 2017-11-16 VITALS — BP 169/53 | HR 66 | Resp 16 | Ht 62.0 in | Wt 140.0 lb

## 2017-11-16 DIAGNOSIS — M25532 Pain in left wrist: Secondary | ICD-10-CM

## 2017-11-16 NOTE — Progress Notes (Signed)
Office Visit Note   Patient: Craig Fisher           Date of Birth: Nov 20, 1960           MRN: [id number] Visit Date: 11/16/2017              Requested by: Sharion Balloon, [address] [place], [place] [address] PCP: Sharion Balloon, FNP   Assessment & Plan: Visit Diagnoses:  1. Pain in left wrist     Plan: [place]olar wrist splint left wrist.  Limited activity at work.  Office 2 weeks.  No further films necessary  Follow-Up Instructions: Return in about 2 weeks (around 11/30/2017).   Orders:  Orders Placed This Encounter  Procedures  . XR Wrist Complete Left   No orders of the defined types were placed in this encounter.     Procedures: No procedures performed   Clinical Data: No additional findings.   Subjective: Chief Complaint  Patient presents with  . Left Wrist - Pain  . Follow-up    11-04-17 LEFT RADIAL STYLOID FX F/U  In last week for initial evaluation with a nondisplaced left radial styloid fracture.  Maintained tong splint.  Has been comfortable.  Has been able to work in the splint light duty.  HPI  Review of Systems  Constitutional: Negative for fatigue.  HENT: Negative for ear pain.   Eyes: Negative for pain.  Respiratory: Negative for cough.   Cardiovascular: Negative for leg swelling.  Gastrointestinal: Negative for constipation and diarrhea.  Genitourinary: Negative for difficulty urinating.  Musculoskeletal: Negative for back pain.  Skin: Negative for rash.  Neurological: Positive for weakness.  Hematological: Does not bruise/bleed easily.  Psychiatric/Behavioral: Negative for sleep disturbance.     Objective: Vital Signs: BP (!) 169/53 (BP Location: Right Arm, Patient Position: Sitting, Cuff Size: Normal)   Pulse 66   Resp 16   Ht 5\' 2"  (1.575 m)   Wt 140 lb (63.5 kg)   BMI 25.61 kg/m   Physical Exam  Ortho Exam splint removed.  Skin intact.  Mild tenderness over the radial styloid left wrist.  Wrist based on the short  period of immobilization.  Good sensibility to the tips of the fingers.  No deformity identified  Specialty Comments:  No specialty comments available.  Imaging: Xr Wrist Complete Left  Result Date: 11/16/2017 Films of the left wrist were obtained in several projections.  The radial styloid fracture is non-displaced and no change in position from prior films    PMFS History: Patient Active Problem List   Diagnosis Date Noted  . Pain of upper abdomen 03/16/2017  . Non-intractable vomiting with nausea 03/16/2017  . GERD (gastroesophageal reflux disease) 10/08/2016  . Peritoneal dialysis catheter placed 07/07/2016 07/07/2016  . ESRD (end stage renal disease) on dialysis ([place]) 07/07/2016  . ESRD on dialysis ([place]) 12/05/2015  . Chronic kidney disease requiring chronic dialysis ([place]) 10/21/2015  . Chronic kidney disease (CKD) stage G4/A1 04/07/2015  . Edema 08/20/2014  . Diabetes mellitus type 2 with complications, uncontrolled ([place]) 05/06/2014  . Type 2 diabetes mellitus with diabetic nephropathy ([place]) 05/06/2014  . Bilateral inguinal hernia (BIH) s/p lap repair w mesh 07/07/2016 01/23/2014  . Diabetic retinopathy ([place]) 03/30/2013  . Essential hypertension 12/09/2012  . Hyperlipidemia 12/09/2012  . Helicobacter pylori antibody positive 12/09/2012   Past Medical History:  Diagnosis Date  . Anemia   . Blind left eye   . Complication of anesthesia    Palpitations at dentist  .  Diabetes mellitus without complication ([place])    Type II  . Diabetic retinopathy ([place])   . ESRD (end stage renal disease) ([place])    Hemodialysis TTHS  . Full dentures   . Headache   . Hyperlipidemia   . Hypertension   . Shortness of breath dyspnea    " only once after surgery, when going to bed" 07/06/16, patient denies shortness of breath  . Wears glasses     Family History  Problem Relation Age of Onset  . Diabetes Mother   . Diabetes Sister   . Esophageal cancer Neg Hx   . Colon cancer Neg Hx     . Rectal cancer Neg Hx   . Stomach cancer Neg Hx     Past Surgical History:  Procedure Laterality Date  . AV FISTULA PLACEMENT Left 10/29/2015   Procedure: BRACHIOCEPHALIC ARTERIOVENOUS (AV) FISTULA CREATION;  Surgeon: Mal Misty, MD;  Location: [facility];  Service: Vascular;  Laterality: Left;  . CAPD INSERTION N/A 07/07/2016   Procedure: LAPAROSCOPIC INSERTION CONTINUOUS AMBULATORY PERITONEAL DIALYSIS CATHETER WITH OMENTOPEXY;  Surgeon: Michael Boston, MD;  Location: [facility];  Service: General;  Laterality: N/A;  . COLONOSCOPY     One small polyp  . ESOPHAGOGASTRODUODENOSCOPY N/A 04/05/2017   Procedure: ESOPHAGOGASTRODUODENOSCOPY (EGD);  Surgeon: Rogene Houston, MD;  [facility];  Service: Endoscopy;  Laterality: N/A;  12:15  . EYE SURGERY     both eyes-retine  . eyes     detached retina-lt  . INGUINAL HERNIA REPAIR Bilateral 07/07/2016   Procedure: REPAIR BILATERAL INGUINAL HERNIAS;  Surgeon: Michael Boston, MD;  Location: [facility];  Service: General;  Laterality: Bilateral;  . INSERTION OF DIALYSIS CATHETER N/A 11/07/2015   Procedure: INSERTION OF DIALYSIS CATHETER;  Surgeon: Elam Dutch, MD;  Location: [facility];  Service: Vascular;  Laterality: N/A;  . INSERTION OF MESH Bilateral 07/07/2016   Procedure: INSERTION OF MESH;  Surgeon: Michael Boston, MD;  Location: [facility];  Service: General;  Laterality: Bilateral;  . IR GENERIC HISTORICAL  02/20/2016   IR REMOVAL TUN CV CATH W/O FL 02/20/2016 Corrie Mckusick, DO [facility]-INTER[facility]   Social History   Occupational History  . Not on file  Tobacco Use  . Smoking status: Never Smoker  . Smokeless tobacco: Never Used  Substance and Sexual Activity  . Alcohol use: No    Alcohol/week: 0.0 oz  . Drug use: No  . Sexual activity: Yes    Birth control/protection: Condom

## 2017-11-30 ENCOUNTER — Ambulatory Visit (INDEPENDENT_AMBULATORY_CARE_PROVIDER_SITE_OTHER): Payer: Managed Care, Other (non HMO) | Admitting: Orthopaedic Surgery

## 2017-12-07 ENCOUNTER — Ambulatory Visit (INDEPENDENT_AMBULATORY_CARE_PROVIDER_SITE_OTHER): Payer: Managed Care, Other (non HMO) | Admitting: Orthopaedic Surgery

## 2017-12-09 MED ORDER — HYDROCODONE-ACETAMINOPHEN 5-325 MG PO TABS
1.00 | ORAL_TABLET | ORAL | Status: DC
Start: ? — End: 2017-12-09

## 2017-12-09 MED ORDER — METOPROLOL TARTRATE 25 MG PO TABS
25.00 | ORAL_TABLET | ORAL | Status: DC
Start: 2017-12-09 — End: 2017-12-09

## 2017-12-09 MED ORDER — MYCOPHENOLATE SODIUM 360 MG PO TBEC
720.00 | DELAYED_RELEASE_TABLET | ORAL | Status: DC
Start: 2017-12-09 — End: 2017-12-09

## 2017-12-09 MED ORDER — INSULIN LISPRO 100 UNIT/ML ~~LOC~~ SOLN
1.00 | SUBCUTANEOUS | Status: DC
Start: 2017-12-09 — End: 2017-12-09

## 2017-12-09 MED ORDER — PREDNISONE 10 MG PO TABS
20.00 | ORAL_TABLET | ORAL | Status: DC
Start: 2017-12-10 — End: 2017-12-09

## 2017-12-09 MED ORDER — BENZOCAINE-MENTHOL 6-10 MG MT LOZG
1.00 | LOZENGE | OROMUCOSAL | Status: DC
Start: ? — End: 2017-12-09

## 2017-12-09 MED ORDER — ASPIRIN EC 81 MG PO TBEC
81.00 | DELAYED_RELEASE_TABLET | ORAL | Status: DC
Start: 2017-12-10 — End: 2017-12-09

## 2017-12-09 MED ORDER — FLUCONAZOLE 50 MG PO TABS
50.00 | ORAL_TABLET | ORAL | Status: DC
Start: 2017-12-10 — End: 2017-12-09

## 2017-12-09 MED ORDER — ONDANSETRON HCL 4 MG/2ML IJ SOLN
4.00 | INTRAMUSCULAR | Status: DC
Start: ? — End: 2017-12-09

## 2017-12-09 MED ORDER — TACROLIMUS 1 MG PO CAPS
2.00 | ORAL_CAPSULE | ORAL | Status: DC
Start: 2017-12-09 — End: 2017-12-09

## 2017-12-09 MED ORDER — POLYETHYLENE GLYCOL 3350 17 G PO PACK
17.00 g | PACK | ORAL | Status: DC
Start: 2017-12-10 — End: 2017-12-09

## 2017-12-09 MED ORDER — SULFAMETHOXAZOLE-TRIMETHOPRIM 400-80 MG PO TABS
1.00 | ORAL_TABLET | ORAL | Status: DC
Start: 2017-12-12 — End: 2017-12-09

## 2017-12-09 MED ORDER — ACETAMINOPHEN 325 MG PO TABS
325.00 | ORAL_TABLET | ORAL | Status: DC
Start: ? — End: 2017-12-09

## 2017-12-09 MED ORDER — DEXTROSE 10 % IV SOLN
125.00 | INTRAVENOUS | Status: DC
Start: ? — End: 2017-12-09

## 2017-12-09 MED ORDER — PANTOPRAZOLE SODIUM 40 MG PO TBEC
40.00 | DELAYED_RELEASE_TABLET | ORAL | Status: DC
Start: 2017-12-10 — End: 2017-12-09

## 2017-12-09 MED ORDER — LABETALOL HCL 5 MG/ML IV SOLN
20.00 | INTRAVENOUS | Status: DC
Start: ? — End: 2017-12-09

## 2017-12-09 MED ORDER — SEVELAMER CARBONATE 800 MG PO TABS
1600.00 | ORAL_TABLET | ORAL | Status: DC
Start: 2017-12-10 — End: 2017-12-09

## 2017-12-09 MED ORDER — GENERIC EXTERNAL MEDICATION
1000.00 | Status: DC
Start: 2017-12-10 — End: 2017-12-09

## 2017-12-09 MED ORDER — SIMETHICONE 80 MG PO CHEW
80.00 | CHEWABLE_TABLET | ORAL | Status: DC
Start: 2017-12-09 — End: 2017-12-09

## 2017-12-28 MED ORDER — PREDNISONE 10 MG PO TABS
10.00 | ORAL_TABLET | ORAL | Status: DC
Start: 2017-12-29 — End: 2017-12-28

## 2017-12-28 MED ORDER — ASPIRIN EC 81 MG PO TBEC
81.00 | DELAYED_RELEASE_TABLET | ORAL | Status: DC
Start: 2017-12-29 — End: 2017-12-28

## 2017-12-28 MED ORDER — GENERIC EXTERNAL MEDICATION
Status: DC
Start: ? — End: 2017-12-28

## 2017-12-28 MED ORDER — FLUCONAZOLE 50 MG PO TABS
50.00 | ORAL_TABLET | ORAL | Status: DC
Start: 2017-12-29 — End: 2017-12-28

## 2017-12-28 MED ORDER — TACROLIMUS 1 MG PO CAPS
2.00 | ORAL_CAPSULE | ORAL | Status: DC
Start: 2017-12-29 — End: 2017-12-28

## 2017-12-28 MED ORDER — SULFAMETHOXAZOLE-TRIMETHOPRIM 400-80 MG PO TABS
1.00 | ORAL_TABLET | ORAL | Status: DC
Start: 2017-12-30 — End: 2017-12-28

## 2017-12-28 MED ORDER — HYDROCODONE-ACETAMINOPHEN 5-325 MG PO TABS
1.00 | ORAL_TABLET | ORAL | Status: DC
Start: ? — End: 2017-12-28

## 2017-12-28 MED ORDER — LABETALOL HCL 5 MG/ML IV SOLN
5.00 | INTRAVENOUS | Status: DC
Start: ? — End: 2017-12-28

## 2017-12-28 MED ORDER — MYCOPHENOLATE SODIUM 360 MG PO TBEC
360.00 | DELAYED_RELEASE_TABLET | ORAL | Status: DC
Start: 2017-12-28 — End: 2017-12-28

## 2017-12-28 MED ORDER — ONDANSETRON HCL 4 MG/2ML IJ SOLN
4.00 | INTRAMUSCULAR | Status: DC
Start: 2017-12-28 — End: 2017-12-28

## 2017-12-28 MED ORDER — PANTOPRAZOLE SODIUM 40 MG PO TBEC
40.00 | DELAYED_RELEASE_TABLET | ORAL | Status: DC
Start: ? — End: 2017-12-28

## 2017-12-28 MED ORDER — METOCLOPRAMIDE HCL 5 MG PO TABS
10.00 | ORAL_TABLET | ORAL | Status: DC
Start: 2017-12-28 — End: 2017-12-28

## 2017-12-28 MED ORDER — METOPROLOL TARTRATE 50 MG PO TABS
50.00 | ORAL_TABLET | ORAL | Status: DC
Start: 2017-12-28 — End: 2017-12-28

## 2017-12-28 MED ORDER — PROMETHAZINE HCL 12.5 MG PO TABS
12.50 | ORAL_TABLET | ORAL | Status: DC
Start: ? — End: 2017-12-28

## 2017-12-28 MED ORDER — AMLODIPINE BESYLATE 5 MG PO TABS
10.00 | ORAL_TABLET | ORAL | Status: DC
Start: 2017-12-29 — End: 2017-12-28

## 2017-12-28 MED ORDER — TACROLIMUS 1 MG PO CAPS
1.00 | ORAL_CAPSULE | ORAL | Status: DC
Start: 2017-12-28 — End: 2017-12-28

## 2017-12-28 MED ORDER — SODIUM BICARBONATE 650 MG PO TABS
650.00 | ORAL_TABLET | ORAL | Status: DC
Start: 2017-12-28 — End: 2017-12-28

## 2017-12-28 MED ORDER — VALGANCICLOVIR HCL 450 MG PO TABS
450.00 | ORAL_TABLET | ORAL | Status: DC
Start: 2017-12-30 — End: 2017-12-28

## 2017-12-28 MED ORDER — ATORVASTATIN CALCIUM 10 MG PO TABS
20.00 | ORAL_TABLET | ORAL | Status: DC
Start: 2017-12-29 — End: 2017-12-28

## 2017-12-28 MED ORDER — INSULIN LISPRO 100 UNIT/ML ~~LOC~~ SOLN
1.00 | SUBCUTANEOUS | Status: DC
Start: 2017-12-29 — End: 2017-12-28

## 2017-12-28 MED ORDER — DEXTROSE 10 % IV SOLN
125.00 | INTRAVENOUS | Status: DC
Start: ? — End: 2017-12-28

## 2018-09-02 IMAGING — CT CT ANGIO CHEST
2 of 6 series · 18 of 46 positions shown · IV contrast (Isovue)
Comparison: Chest x-ray November 07, 2015

CLINICAL DATA: Shortness of breath.

EXAM:
CT ANGIOGRAPHY CHEST WITH CONTRAST
TECHNIQUE: Multidetector CT imaging of the chest was performed using the
standard protocol during bolus administration of intravenous
contrast. Multiplanar CT image reconstructions and MIPs were
obtained to evaluate the vascular anatomy.
CONTRAST:  100 mL of Isovue 370

[Series 4: axial arterial · axial · arterial · 0.63mm/px · z∈[+1387,+1636]mm · 15 of 93 slices shown]
[im 5/93  lung]
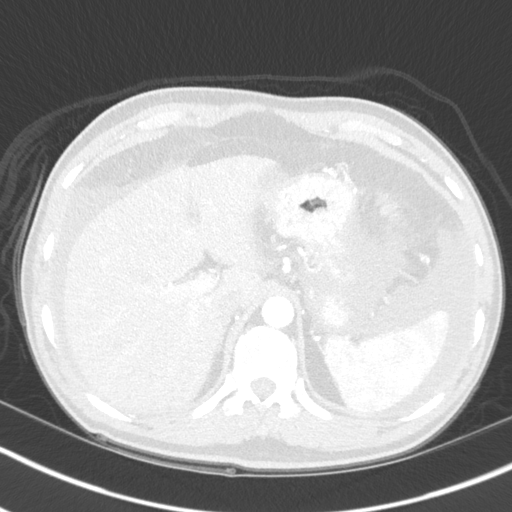
[im 10/93  soft-tissue]
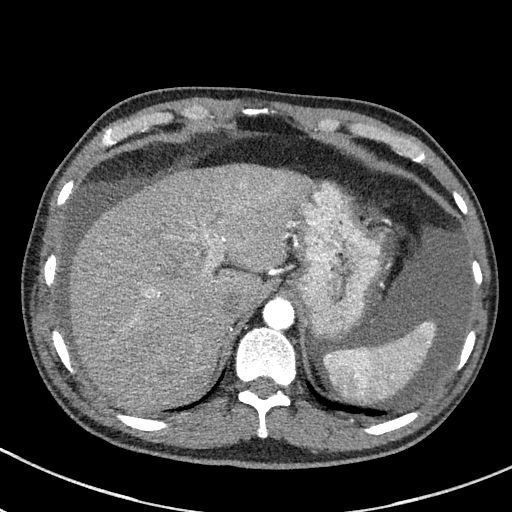
[im 20/93  lung]
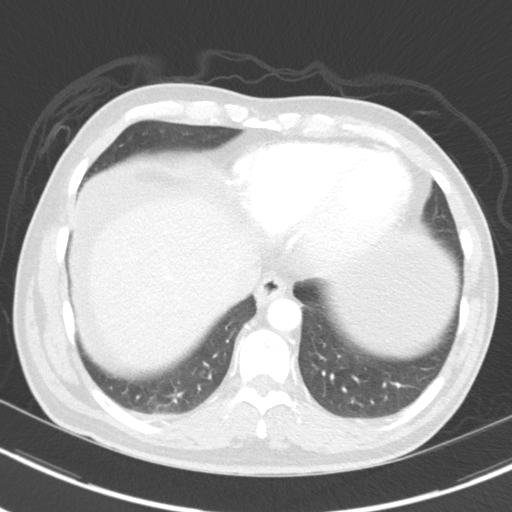
[im 25/93  soft-tissue]
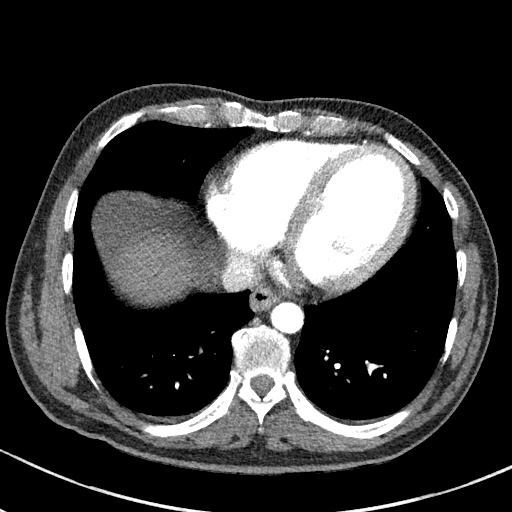
[im 30/93  lung]
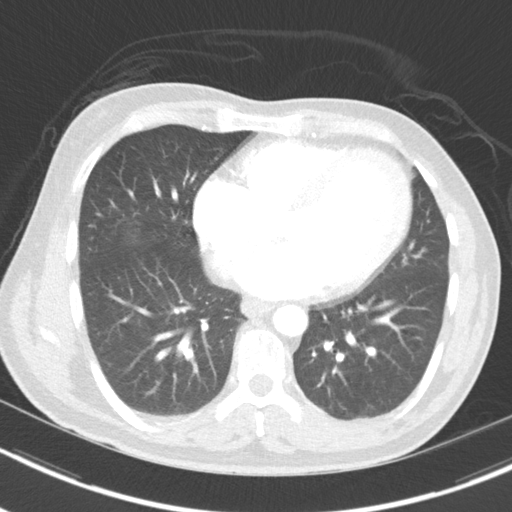
[im 34/93  soft-tissue]
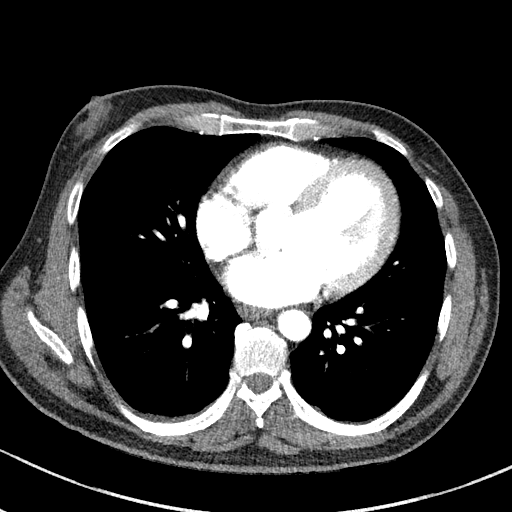
[im 39/93  lung]
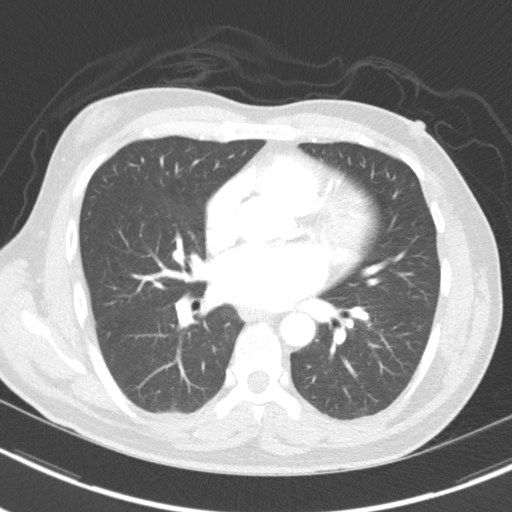
[im 49/93  soft-tissue]
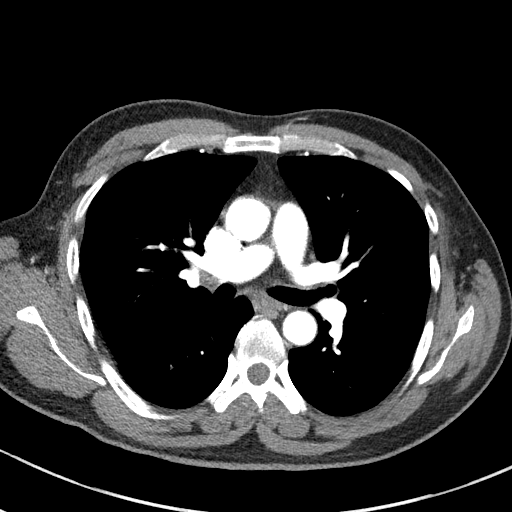
[im 54/93  lung]
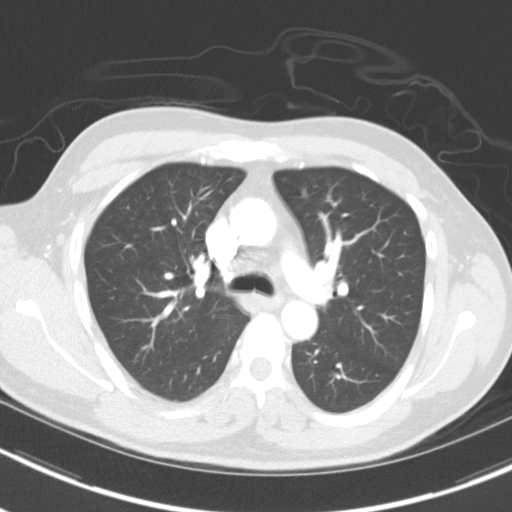
[im 59/93  soft-tissue]
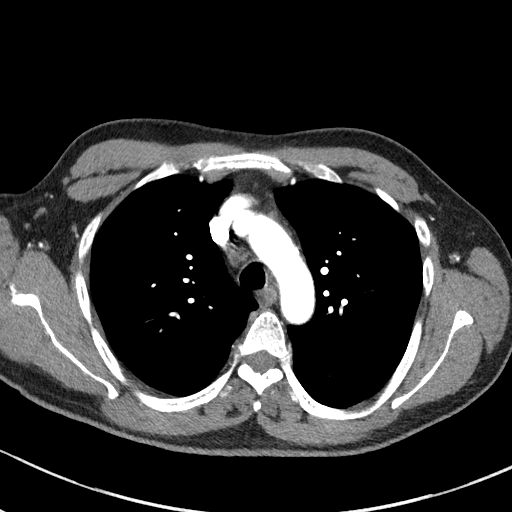
[im 63/93  lung]
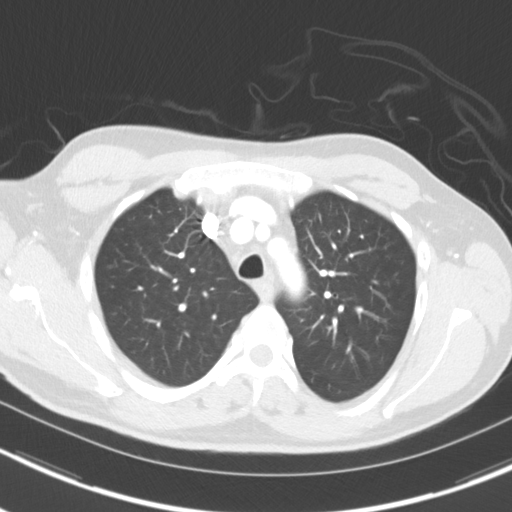
[im 68/93  soft-tissue]
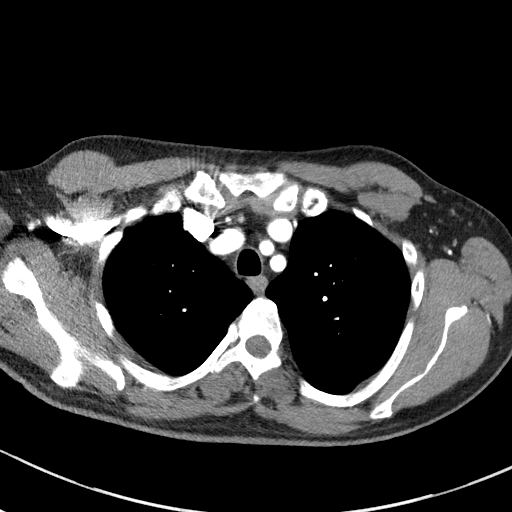
[im 78/93  lung]
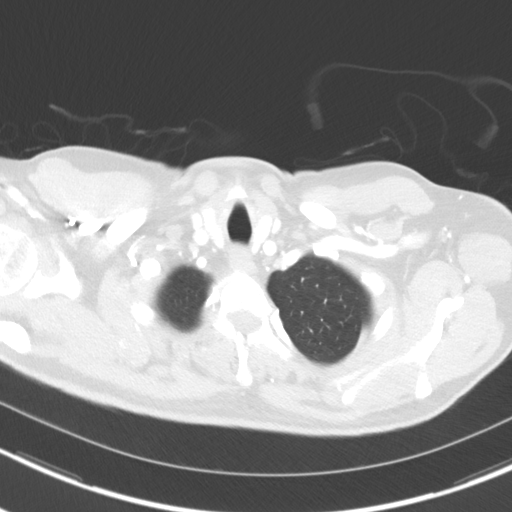
[im 83/93  soft-tissue]
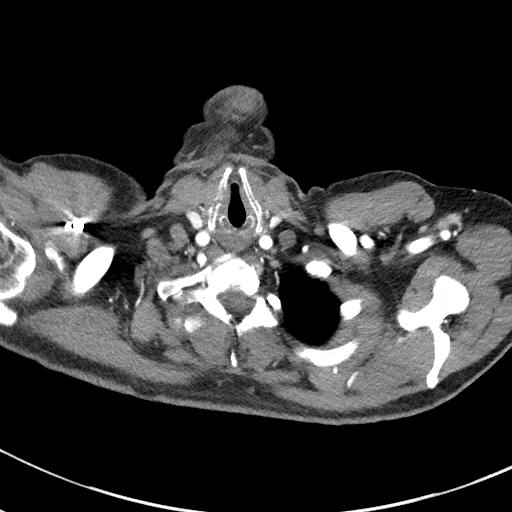
[im 88/93  lung]
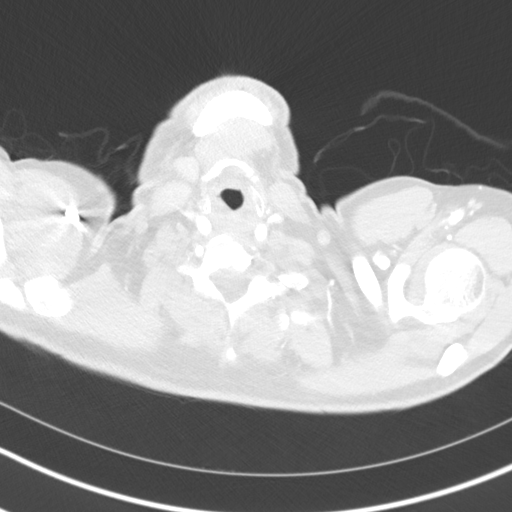

[Series 8: coronals · coronal · 0.57mm/px · 3 of 114 slices shown]
[im 29/114  soft-tissue]
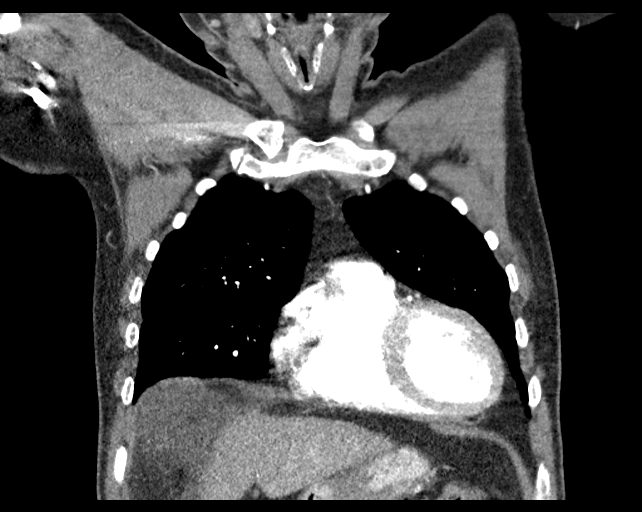
[im 57/114  soft-tissue]
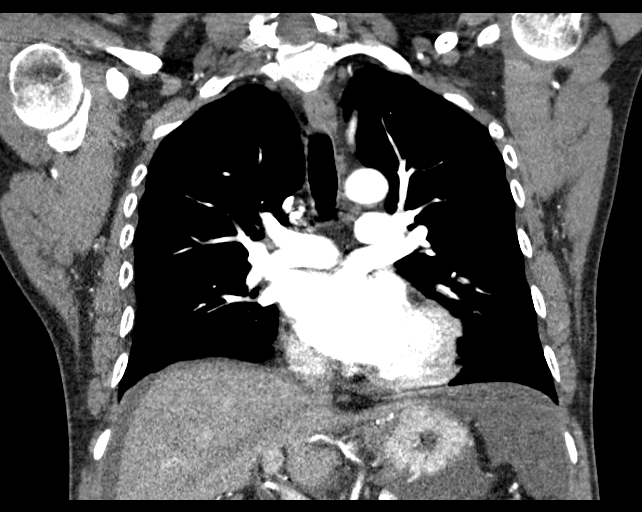
[im 85/114  soft-tissue]
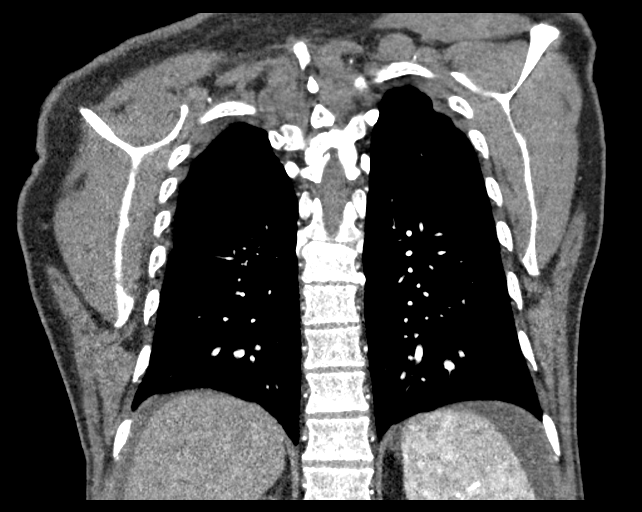

[18 of 46 positions shown; findings below may reference images not displayed]

FINDINGS: Cardiovascular: The thoracic aorta is non aneurysmal with no
dissection. Minimal coronary artery calcifications identified. The
heart is borderline in size. No pulmonary emboli identified.

Mediastinum/Nodes: Right-sided gynecomastia. The thyroid and
esophagus are normal. No adenopathy. No significant effusions.

Lungs/Pleura: The central airways are within normal limits. No
pneumothorax. No pulmonary nodules or masses. No focal infiltrates.
No overt edema.

Upper Abdomen: Fluid is seen in the upper abdomen. Based on scout
views, the patient may be receiving peritoneal dialysis. Recommend
clinical correlation. No other abnormalities in the upper abdomen.

Musculoskeletal: No chest wall abnormality. No acute or significant
osseous findings.

Review of the MIP images confirms the above findings.
IMPRESSION: 1. The thoracic aorta is non aneurysmal with no dissection.
2. Minimal coronary artery calcifications.
3. No pulmonary emboli.
4. No acute abnormalities in the lungs.
5. Fluid in the upper abdomen. Based on scout views, the patient may
be receiving peritoneal dialysis which could explain. Recommend
clinical correlation.

## 2020-06-23 ENCOUNTER — Emergency Department (HOSPITAL_COMMUNITY): Payer: Medicare Other

## 2020-06-23 ENCOUNTER — Inpatient Hospital Stay (HOSPITAL_COMMUNITY)
Admission: EM | Admit: 2020-06-23 | Discharge: 2020-07-19 | DRG: 208 | Disposition: E | Payer: Medicare Other | Attending: Internal Medicine | Admitting: Internal Medicine

## 2020-06-23 ENCOUNTER — Encounter (HOSPITAL_COMMUNITY): Payer: Self-pay | Admitting: Emergency Medicine

## 2020-06-23 ENCOUNTER — Other Ambulatory Visit: Payer: Self-pay

## 2020-06-23 DIAGNOSIS — E440 Moderate protein-calorie malnutrition: Secondary | ICD-10-CM | POA: Diagnosis present

## 2020-06-23 DIAGNOSIS — E1121 Type 2 diabetes mellitus with diabetic nephropathy: Secondary | ICD-10-CM | POA: Diagnosis not present

## 2020-06-23 DIAGNOSIS — N179 Acute kidney failure, unspecified: Secondary | ICD-10-CM | POA: Diagnosis present

## 2020-06-23 DIAGNOSIS — E11319 Type 2 diabetes mellitus with unspecified diabetic retinopathy without macular edema: Secondary | ICD-10-CM | POA: Diagnosis present

## 2020-06-23 DIAGNOSIS — K760 Fatty (change of) liver, not elsewhere classified: Secondary | ICD-10-CM | POA: Diagnosis present

## 2020-06-23 DIAGNOSIS — B370 Candidal stomatitis: Secondary | ICD-10-CM | POA: Diagnosis not present

## 2020-06-23 DIAGNOSIS — N186 End stage renal disease: Secondary | ICD-10-CM | POA: Diagnosis present

## 2020-06-23 DIAGNOSIS — Y95 Nosocomial condition: Secondary | ICD-10-CM | POA: Diagnosis present

## 2020-06-23 DIAGNOSIS — Z9289 Personal history of other medical treatment: Secondary | ICD-10-CM

## 2020-06-23 DIAGNOSIS — J9811 Atelectasis: Secondary | ICD-10-CM | POA: Diagnosis present

## 2020-06-23 DIAGNOSIS — I160 Hypertensive urgency: Secondary | ICD-10-CM | POA: Diagnosis not present

## 2020-06-23 DIAGNOSIS — R578 Other shock: Secondary | ICD-10-CM | POA: Diagnosis not present

## 2020-06-23 DIAGNOSIS — R6521 Severe sepsis with septic shock: Secondary | ICD-10-CM | POA: Diagnosis not present

## 2020-06-23 DIAGNOSIS — E1122 Type 2 diabetes mellitus with diabetic chronic kidney disease: Secondary | ICD-10-CM | POA: Diagnosis present

## 2020-06-23 DIAGNOSIS — D62 Acute posthemorrhagic anemia: Secondary | ICD-10-CM | POA: Diagnosis not present

## 2020-06-23 DIAGNOSIS — J9601 Acute respiratory failure with hypoxia: Secondary | ICD-10-CM | POA: Diagnosis present

## 2020-06-23 DIAGNOSIS — R1110 Vomiting, unspecified: Secondary | ICD-10-CM | POA: Diagnosis not present

## 2020-06-23 DIAGNOSIS — I132 Hypertensive heart and chronic kidney disease with heart failure and with stage 5 chronic kidney disease, or end stage renal disease: Secondary | ICD-10-CM | POA: Diagnosis present

## 2020-06-23 DIAGNOSIS — D849 Immunodeficiency, unspecified: Secondary | ICD-10-CM | POA: Diagnosis present

## 2020-06-23 DIAGNOSIS — J80 Acute respiratory distress syndrome: Secondary | ICD-10-CM

## 2020-06-23 DIAGNOSIS — N1831 Chronic kidney disease, stage 3a: Secondary | ICD-10-CM | POA: Diagnosis present

## 2020-06-23 DIAGNOSIS — J159 Unspecified bacterial pneumonia: Secondary | ICD-10-CM | POA: Diagnosis present

## 2020-06-23 DIAGNOSIS — H5462 Unqualified visual loss, left eye, normal vision right eye: Secondary | ICD-10-CM | POA: Diagnosis present

## 2020-06-23 DIAGNOSIS — Z940 Kidney transplant status: Secondary | ICD-10-CM

## 2020-06-23 DIAGNOSIS — E874 Mixed disorder of acid-base balance: Secondary | ICD-10-CM | POA: Diagnosis present

## 2020-06-23 DIAGNOSIS — J9382 Other air leak: Secondary | ICD-10-CM | POA: Diagnosis not present

## 2020-06-23 DIAGNOSIS — Z794 Long term (current) use of insulin: Secondary | ICD-10-CM | POA: Diagnosis not present

## 2020-06-23 DIAGNOSIS — E871 Hypo-osmolality and hyponatremia: Secondary | ICD-10-CM | POA: Diagnosis present

## 2020-06-23 DIAGNOSIS — J930 Spontaneous tension pneumothorax: Secondary | ICD-10-CM | POA: Diagnosis present

## 2020-06-23 DIAGNOSIS — Z79899 Other long term (current) drug therapy: Secondary | ICD-10-CM

## 2020-06-23 DIAGNOSIS — U071 COVID-19: Principal | ICD-10-CM

## 2020-06-23 DIAGNOSIS — J1282 Pneumonia due to coronavirus disease 2019: Secondary | ICD-10-CM | POA: Diagnosis present

## 2020-06-23 DIAGNOSIS — R739 Hyperglycemia, unspecified: Secondary | ICD-10-CM

## 2020-06-23 DIAGNOSIS — D696 Thrombocytopenia, unspecified: Secondary | ICD-10-CM | POA: Diagnosis not present

## 2020-06-23 DIAGNOSIS — R109 Unspecified abdominal pain: Secondary | ICD-10-CM | POA: Diagnosis not present

## 2020-06-23 DIAGNOSIS — Z66 Do not resuscitate: Secondary | ICD-10-CM | POA: Diagnosis not present

## 2020-06-23 DIAGNOSIS — J95811 Postprocedural pneumothorax: Secondary | ICD-10-CM | POA: Diagnosis not present

## 2020-06-23 DIAGNOSIS — E861 Hypovolemia: Secondary | ICD-10-CM | POA: Diagnosis present

## 2020-06-23 DIAGNOSIS — R0902 Hypoxemia: Secondary | ICD-10-CM

## 2020-06-23 DIAGNOSIS — E785 Hyperlipidemia, unspecified: Secondary | ICD-10-CM | POA: Diagnosis present

## 2020-06-23 DIAGNOSIS — T8619 Other complication of kidney transplant: Secondary | ICD-10-CM | POA: Diagnosis present

## 2020-06-23 DIAGNOSIS — Z452 Encounter for adjustment and management of vascular access device: Secondary | ICD-10-CM

## 2020-06-23 DIAGNOSIS — K5730 Diverticulosis of large intestine without perforation or abscess without bleeding: Secondary | ICD-10-CM | POA: Diagnosis present

## 2020-06-23 DIAGNOSIS — T380X5A Adverse effect of glucocorticoids and synthetic analogues, initial encounter: Secondary | ICD-10-CM | POA: Diagnosis not present

## 2020-06-23 DIAGNOSIS — Y830 Surgical operation with transplant of whole organ as the cause of abnormal reaction of the patient, or of later complication, without mention of misadventure at the time of the procedure: Secondary | ICD-10-CM | POA: Diagnosis present

## 2020-06-23 DIAGNOSIS — E860 Dehydration: Secondary | ICD-10-CM | POA: Diagnosis present

## 2020-06-23 DIAGNOSIS — R0602 Shortness of breath: Secondary | ICD-10-CM

## 2020-06-23 DIAGNOSIS — Z7952 Long term (current) use of systemic steroids: Secondary | ICD-10-CM

## 2020-06-23 DIAGNOSIS — E11649 Type 2 diabetes mellitus with hypoglycemia without coma: Secondary | ICD-10-CM | POA: Diagnosis present

## 2020-06-23 DIAGNOSIS — K30 Functional dyspepsia: Secondary | ICD-10-CM | POA: Diagnosis present

## 2020-06-23 DIAGNOSIS — Z833 Family history of diabetes mellitus: Secondary | ICD-10-CM

## 2020-06-23 DIAGNOSIS — R7989 Other specified abnormal findings of blood chemistry: Secondary | ICD-10-CM | POA: Diagnosis not present

## 2020-06-23 DIAGNOSIS — E1165 Type 2 diabetes mellitus with hyperglycemia: Secondary | ICD-10-CM | POA: Diagnosis present

## 2020-06-23 LAB — URINALYSIS, ROUTINE W REFLEX MICROSCOPIC
Bacteria, UA: NONE SEEN
Bilirubin Urine: NEGATIVE
Glucose, UA: >=500 mg/dL — AB
Hgb urine dipstick: NEGATIVE
Ketones, ur: 20 mg/dL — AB
Leukocytes,Ua: NEGATIVE
Nitrite: NEGATIVE
Protein, ur: >=300 mg/dL — AB
Specific Gravity, Urine: 1.027 (ref 1.005–1.030)
pH: 5 (ref 5.0–8.0)

## 2020-06-23 LAB — CBC WITH DIFFERENTIAL/PLATELET
Abs Immature Granulocytes: 0.01 10*3/uL (ref 0.00–0.07)
Basophils Absolute: 0 10*3/uL (ref 0.0–0.1)
Basophils Relative: 0 %
Eosinophils Absolute: 0 10*3/uL (ref 0.0–0.5)
Eosinophils Relative: 0 %
HCT: 44.5 % (ref 39.0–52.0)
Hemoglobin: 14.4 g/dL (ref 13.0–17.0)
Immature Granulocytes: 0 %
Lymphocytes Relative: 6 %
Lymphs Abs: 0.5 10*3/uL — ABNORMAL LOW (ref 0.7–4.0)
MCH: 26.8 pg (ref 26.0–34.0)
MCHC: 32.4 g/dL (ref 30.0–36.0)
MCV: 82.7 fL (ref 80.0–100.0)
Monocytes Absolute: 0.3 10*3/uL (ref 0.1–1.0)
Monocytes Relative: 4 %
Neutro Abs: 7.1 10*3/uL (ref 1.7–7.7)
Neutrophils Relative %: 90 %
Platelets: 195 10*3/uL (ref 150–400)
RBC: 5.38 MIL/uL (ref 4.22–5.81)
RDW: 15.2 % (ref 11.5–15.5)
WBC: 7.8 10*3/uL (ref 4.0–10.5)
nRBC: 0 % (ref 0.0–0.2)

## 2020-06-23 LAB — COMPREHENSIVE METABOLIC PANEL
ALT: 15 U/L (ref 0–44)
AST: 35 U/L (ref 15–41)
Albumin: 2.7 g/dL — ABNORMAL LOW (ref 3.5–5.0)
Alkaline Phosphatase: 44 U/L (ref 38–126)
Anion gap: 12 (ref 5–15)
BUN: 25 mg/dL — ABNORMAL HIGH (ref 6–20)
CO2: 19 mmol/L — ABNORMAL LOW (ref 22–32)
Calcium: 7.9 mg/dL — ABNORMAL LOW (ref 8.9–10.3)
Chloride: 96 mmol/L — ABNORMAL LOW (ref 98–111)
Creatinine, Ser: 1.18 mg/dL (ref 0.61–1.24)
GFR, Estimated: >60 mL/min (ref >60)
Glucose, Bld: 282 mg/dL — ABNORMAL HIGH (ref 70–99)
Potassium: 4.5 mmol/L (ref 3.5–5.1)
Sodium: 127 mmol/L — ABNORMAL LOW (ref 135–145)
Total Bilirubin: 0.9 mg/dL (ref 0.3–1.2)
Total Protein: 5.8 g/dL — ABNORMAL LOW (ref 6.5–8.1)

## 2020-06-23 LAB — STREP PNEUMONIAE URINARY ANTIGEN: Strep Pneumo Urinary Antigen: NEGATIVE

## 2020-06-23 LAB — TRIGLYCERIDES: Triglycerides: 204 mg/dL — ABNORMAL HIGH (ref <150)

## 2020-06-23 LAB — D-DIMER, QUANTITATIVE: D-Dimer, Quant: 0.77 ug/mL-FEU — ABNORMAL HIGH (ref 0.00–0.50)

## 2020-06-23 LAB — FIBRINOGEN: Fibrinogen: >800 mg/dL — ABNORMAL HIGH (ref 210–475)

## 2020-06-23 LAB — FERRITIN: Ferritin: 182 ng/mL (ref 24–336)

## 2020-06-23 LAB — PROCALCITONIN: Procalcitonin: 1.3 ng/mL

## 2020-06-23 LAB — LACTIC ACID, PLASMA
Lactic Acid, Venous: 1.8 mmol/L (ref 0.5–1.9)
Lactic Acid, Venous: 1.2 mmol/L (ref 0.5–1.9)

## 2020-06-23 LAB — CBG MONITORING, ED
Glucose-Capillary: 266 mg/dL — ABNORMAL HIGH (ref 70–99)
Glucose-Capillary: 255 mg/dL — ABNORMAL HIGH (ref 70–99)

## 2020-06-23 LAB — LIPASE, BLOOD: Lipase: 19 U/L (ref 11–51)

## 2020-06-23 LAB — LACTATE DEHYDROGENASE: LDH: 522 U/L — ABNORMAL HIGH (ref 98–192)

## 2020-06-23 LAB — C-REACTIVE PROTEIN: CRP: 0.5 mg/dL (ref <1.0)

## 2020-06-23 MED ORDER — INSULIN ASPART 100 UNIT/ML ~~LOC~~ SOLN
0.0000 [IU] | Freq: Every day | SUBCUTANEOUS | Status: DC
  Administered 2020-06-23: 3 [IU] via SUBCUTANEOUS
  Filled 2020-06-23: qty 0.05

## 2020-06-23 MED ORDER — ACETAMINOPHEN 325 MG PO TABS
650.0000 mg | ORAL_TABLET | Freq: Four times a day (QID) | ORAL | Status: DC | PRN
  Administered 2020-06-24 – 2020-06-25 (×3): 650 mg via ORAL
  Filled 2020-06-23 (×3): qty 2

## 2020-06-23 MED ORDER — ATORVASTATIN CALCIUM 10 MG PO TABS
20.0000 mg | ORAL_TABLET | Freq: Every day | ORAL | Status: DC
  Administered 2020-06-24 – 2020-07-07 (×14): 20 mg via ORAL
  Filled 2020-06-23: qty 2
  Filled 2020-06-23 (×2): qty 1
  Filled 2020-06-23: qty 2
  Filled 2020-06-23 (×11): qty 1

## 2020-06-23 MED ORDER — SODIUM CHLORIDE 0.9 % IV SOLN
200.0000 mg | Freq: Once | INTRAVENOUS | Status: AC
  Administered 2020-06-23: 200 mg via INTRAVENOUS
  Filled 2020-06-23: qty 200

## 2020-06-23 MED ORDER — IOHEXOL 300 MG/ML  SOLN
100.0000 mL | Freq: Once | INTRAMUSCULAR | Status: AC | PRN
  Administered 2020-06-23: 100 mL via INTRAVENOUS

## 2020-06-23 MED ORDER — SODIUM CHLORIDE 0.9 % IV SOLN
100.0000 mg | Freq: Every day | INTRAVENOUS | Status: AC
  Administered 2020-06-24 – 2020-06-27 (×4): 100 mg via INTRAVENOUS
  Filled 2020-06-23 (×4): qty 20

## 2020-06-23 MED ORDER — SODIUM CHLORIDE 0.9 % IV SOLN
2.0000 g | INTRAVENOUS | Status: AC
  Administered 2020-06-23 – 2020-06-27 (×5): 2 g via INTRAVENOUS
  Filled 2020-06-23: qty 2
  Filled 2020-06-23 (×2): qty 20
  Filled 2020-06-23: qty 2
  Filled 2020-06-23: qty 20

## 2020-06-23 MED ORDER — CLONIDINE HCL 0.1 MG PO TABS
0.1000 mg | ORAL_TABLET | Freq: Two times a day (BID) | ORAL | Status: DC
  Administered 2020-06-23 – 2020-07-07 (×28): 0.1 mg via ORAL
  Filled 2020-06-23 (×28): qty 1

## 2020-06-23 MED ORDER — TAMSULOSIN HCL 0.4 MG PO CAPS
0.4000 mg | ORAL_CAPSULE | Freq: Every day | ORAL | Status: DC
  Administered 2020-06-23 – 2020-07-07 (×15): 0.4 mg via ORAL
  Filled 2020-06-23 (×17): qty 1

## 2020-06-23 MED ORDER — FENTANYL CITRATE (PF) 100 MCG/2ML IJ SOLN
50.0000 ug | Freq: Once | INTRAMUSCULAR | Status: AC
  Administered 2020-06-23: 50 ug via INTRAVENOUS
  Filled 2020-06-23: qty 2

## 2020-06-23 MED ORDER — INSULIN ASPART 100 UNIT/ML ~~LOC~~ SOLN
5.0000 [IU] | Freq: Once | SUBCUTANEOUS | Status: AC
  Administered 2020-06-23: 5 [IU] via SUBCUTANEOUS
  Filled 2020-06-23: qty 0.05

## 2020-06-23 MED ORDER — TACROLIMUS 0.5 MG PO CAPS
0.5000 mg | ORAL_CAPSULE | Freq: Every day | ORAL | Status: DC
  Administered 2020-06-23 – 2020-07-08 (×15): 0.5 mg via ORAL
  Filled 2020-06-23 (×18): qty 1

## 2020-06-23 MED ORDER — OXYCODONE HCL 5 MG PO TABS
5.0000 mg | ORAL_TABLET | ORAL | Status: DC | PRN
  Filled 2020-06-23: qty 1

## 2020-06-23 MED ORDER — FAMOTIDINE IN NACL 20-0.9 MG/50ML-% IV SOLN
20.0000 mg | Freq: Once | INTRAVENOUS | Status: AC
  Administered 2020-06-23: 20 mg via INTRAVENOUS
  Filled 2020-06-23: qty 50

## 2020-06-23 MED ORDER — AMLODIPINE BESYLATE 10 MG PO TABS
10.0000 mg | ORAL_TABLET | Freq: Every day | ORAL | Status: DC
  Administered 2020-06-23 – 2020-07-06 (×14): 10 mg via ORAL
  Filled 2020-06-23: qty 2
  Filled 2020-06-23 (×14): qty 1

## 2020-06-23 MED ORDER — LINAGLIPTIN 5 MG PO TABS
5.0000 mg | ORAL_TABLET | Freq: Every day | ORAL | Status: DC
  Administered 2020-06-24: 5 mg via ORAL
  Filled 2020-06-23: qty 1

## 2020-06-23 MED ORDER — HYDRALAZINE HCL 25 MG PO TABS
75.0000 mg | ORAL_TABLET | Freq: Once | ORAL | Status: AC
  Administered 2020-06-23: 75 mg via ORAL
  Filled 2020-06-23: qty 3

## 2020-06-23 MED ORDER — ONDANSETRON HCL 4 MG/2ML IJ SOLN: 4.0000 mg | Freq: Four times a day (QID) | INTRAMUSCULAR | Status: DC | PRN

## 2020-06-23 MED ORDER — ONDANSETRON HCL 4 MG/2ML IJ SOLN
4.0000 mg | Freq: Once | INTRAMUSCULAR | Status: AC
  Administered 2020-06-23: 4 mg via INTRAVENOUS
  Filled 2020-06-23: qty 2

## 2020-06-23 MED ORDER — PREDNISONE 20 MG PO TABS: 50.0000 mg | ORAL_TABLET | Freq: Every day | ORAL | Status: DC

## 2020-06-23 MED ORDER — SODIUM CHLORIDE 0.9% FLUSH
3.0000 mL | Freq: Two times a day (BID) | INTRAVENOUS | Status: DC
  Administered 2020-06-24 – 2020-07-08 (×28): 3 mL via INTRAVENOUS

## 2020-06-23 MED ORDER — SENNA 8.6 MG PO TABS: 1.0000 | ORAL_TABLET | Freq: Every day | ORAL | Status: DC | PRN

## 2020-06-23 MED ORDER — SODIUM CHLORIDE 0.9 % IV SOLN: INTRAVENOUS | Status: DC

## 2020-06-23 MED ORDER — ACETAMINOPHEN 325 MG PO TABS
650.0000 mg | ORAL_TABLET | Freq: Once | ORAL | Status: AC
  Administered 2020-06-23: 650 mg via ORAL
  Filled 2020-06-23: qty 2

## 2020-06-23 MED ORDER — FENTANYL CITRATE (PF) 100 MCG/2ML IJ SOLN: 50.0000 ug | INTRAMUSCULAR | Status: DC | PRN

## 2020-06-23 MED ORDER — HYDRALAZINE HCL 50 MG PO TABS
75.0000 mg | ORAL_TABLET | Freq: Three times a day (TID) | ORAL | Status: DC
  Administered 2020-06-24 – 2020-07-02 (×26): 75 mg via ORAL
  Filled 2020-06-23 (×26): qty 1

## 2020-06-23 MED ORDER — INSULIN GLARGINE 100 UNIT/ML ~~LOC~~ SOLN
10.0000 [IU] | Freq: Every day | SUBCUTANEOUS | Status: DC
  Administered 2020-06-23: 10 [IU] via SUBCUTANEOUS
  Filled 2020-06-23 (×2): qty 0.1

## 2020-06-23 MED ORDER — TACROLIMUS 1 MG PO CAPS
1.0000 mg | ORAL_CAPSULE | Freq: Every day | ORAL | Status: DC
  Administered 2020-06-24 – 2020-07-09 (×16): 1 mg via ORAL
  Filled 2020-06-23 (×16): qty 1

## 2020-06-23 MED ORDER — PANTOPRAZOLE SODIUM 20 MG PO TBEC
20.0000 mg | DELAYED_RELEASE_TABLET | Freq: Two times a day (BID) | ORAL | Status: DC
  Administered 2020-06-23 – 2020-07-05 (×25): 20 mg via ORAL
  Filled 2020-06-23 (×28): qty 1

## 2020-06-23 MED ORDER — MORPHINE SULFATE (PF) 4 MG/ML IV SOLN: 4.0000 mg | Freq: Once | INTRAVENOUS | Status: DC

## 2020-06-23 MED ORDER — INSULIN ASPART 100 UNIT/ML ~~LOC~~ SOLN
0.0000 [IU] | Freq: Three times a day (TID) | SUBCUTANEOUS | Status: DC
  Administered 2020-06-24: 7 [IU] via SUBCUTANEOUS
  Administered 2020-06-24: 3 [IU] via SUBCUTANEOUS
  Filled 2020-06-23: qty 0.09

## 2020-06-23 MED ORDER — ENOXAPARIN SODIUM 40 MG/0.4ML ~~LOC~~ SOLN
40.0000 mg | SUBCUTANEOUS | Status: DC
  Administered 2020-06-23 – 2020-06-28 (×6): 40 mg via SUBCUTANEOUS
  Filled 2020-06-23 (×6): qty 0.4

## 2020-06-23 MED ORDER — TACROLIMUS 0.5 MG PO CAPS
0.5000 mg | ORAL_CAPSULE | ORAL | Status: DC
Start: 2020-06-23 — End: 2020-06-23

## 2020-06-23 MED ORDER — ONDANSETRON HCL 4 MG PO TABS: 4.0000 mg | ORAL_TABLET | Freq: Four times a day (QID) | ORAL | Status: DC | PRN

## 2020-06-23 MED ORDER — CARVEDILOL 12.5 MG PO TABS
12.5000 mg | ORAL_TABLET | Freq: Two times a day (BID) | ORAL | Status: DC
  Administered 2020-06-24 – 2020-07-06 (×25): 12.5 mg via ORAL
  Filled 2020-06-23 (×26): qty 1

## 2020-06-23 MED ORDER — LISINOPRIL 2.5 MG PO TABS
2.5000 mg | ORAL_TABLET | Freq: Every day | ORAL | Status: DC
  Administered 2020-06-24 – 2020-07-06 (×13): 2.5 mg via ORAL
  Filled 2020-06-23 (×14): qty 1

## 2020-06-23 MED ORDER — SODIUM CHLORIDE 0.9 % IV SOLN
500.0000 mg | INTRAVENOUS | Status: AC
  Administered 2020-06-23 – 2020-06-27 (×5): 500 mg via INTRAVENOUS
  Filled 2020-06-23 (×5): qty 500

## 2020-06-23 MED ORDER — METHYLPREDNISOLONE SODIUM SUCC 125 MG IJ SOLR
1.0000 mg/kg | Freq: Two times a day (BID) | INTRAMUSCULAR | Status: DC
  Administered 2020-06-23: 63.75 mg via INTRAVENOUS
  Filled 2020-06-23: qty 2

## 2020-06-23 NOTE — H&P (Signed)
History and Physical    Craig Fisher NFA:[id number] DOB: 11-Mar-1961 DOA: 06/20/2020  PCP: Sharion Balloon, FNP   Patient coming from: Home   Chief Complaint: SOB, abdominal pain, N/V   HPI: Craig Fisher is a 59 y.o. male with medical history significant for ESRD secondary to uncontrolled diabetes mellitus status post renal transplantation in 2019, CKD 3a, insulin-dependent diabetes mellitus, hypertension, and COVID-19 diagnosis on 06/20/2020, now presenting to the emergency department from an outpatient clinic for evaluation of hypoxia.  Patient reports that he developed general malaise, aches, upper abdominal discomfort, nausea, vomiting, and loose stools on 06/18/2020.  Since then, he has continued to feel poor and has had worsening fatigue and malaise.  He was seen at an outside facility on 06/20/2020 where he was diagnosed with COVID-19, was stable for discharge from the ED at that time, and monoclonal antibody treatment was arranged for today.  When he showed up for that treatment, he was found to be hypoxic with a saturation of 82% on room air and was sent to the ED.  Patient denies any chest pain, hemoptysis, leg swelling or tenderness, headache, change in vision or hearing, or focal numbness or weakness.  He has been in contact with his renal transplant team who are aware that he has been admitted to the hospital, agreed with admission to this facility, and advised holding the Myfortic and continuing Prograf.  ED Course: Upon arrival to the ED, patient is found to be febrile to 38.2 C, saturating mid 80s on room air, slightly tachypneic, and with blood pressure as high as 208/75.  Chest x-ray is notable for patchy airspace opacities, right greater than left.  CT the abdomen and pelvis demonstrates fatty liver and normal-appearing renal transplant.  Chemistry panel notable for glucose 282, sodium 127, bicarbonate 19, and creatinine 1.18, down from 1.33 three days ago.  Lipase is normal.  CBC is  unremarkable.  D-dimer is slightly elevated and procalcitonin is 1.30.  Urinalysis with proteinuria and glucosuria as well as 20 ketones.  Lactic acid is normal.  Blood cultures were collected in the emergency department and the patient was treated with oral hydralazine, 5 units of NovoLog, acetaminophen, fentanyl, and Zofran.  He was started on 5 L/min of supplemental oxygen.  Review of Systems:  All other systems reviewed and apart from HPI, are negative.  Past Medical History:  Diagnosis Date  . Anemia   . Blind left eye   . Complication of anesthesia    Palpitations at dentist  . Diabetes mellitus without complication ([place])    Type II  . Diabetic retinopathy ([place])   . ESRD (end stage renal disease) ([place])    Hemodialysis TTHS  . Full dentures   . Headache   . Hyperlipidemia   . Hypertension   . Shortness of breath dyspnea    " only once after surgery, when going to bed" 07/06/16, patient denies shortness of breath  . Wears glasses     Past Surgical History:  Procedure Laterality Date  . AV FISTULA PLACEMENT Left 10/29/2015   Procedure: BRACHIOCEPHALIC ARTERIOVENOUS (AV) FISTULA CREATION;  Surgeon: Mal Misty, MD;  Location: [place];  Service: Vascular;  Laterality: Left;  . CAPD INSERTION N/A 07/07/2016   Procedure: LAPAROSCOPIC INSERTION CONTINUOUS AMBULATORY PERITONEAL DIALYSIS CATHETER WITH OMENTOPEXY;  Surgeon: Michael Boston, MD;  Location: [place];  Service: General;  Laterality: N/A;  . COLONOSCOPY     One small polyp  . ESOPHAGOGASTRODUODENOSCOPY N/A 04/05/2017   Procedure:  ESOPHAGOGASTRODUODENOSCOPY (EGD);  Surgeon: Rogene Houston, MD;  [facility];  Service: Endoscopy;  Laterality: N/A;  12:15  . EYE SURGERY     both eyes-retine  . eyes     detached retina-lt  . INGUINAL HERNIA REPAIR Bilateral 07/07/2016   Procedure: REPAIR BILATERAL INGUINAL HERNIAS;  Surgeon: Michael Boston, MD;  Location: [place];  Service: General;  Laterality: Bilateral;  . INSERTION  OF DIALYSIS CATHETER N/A 11/07/2015   Procedure: INSERTION OF DIALYSIS CATHETER;  Surgeon: Elam Dutch, MD;  Location: [place];  Service: Vascular;  Laterality: N/A;  . INSERTION OF MESH Bilateral 07/07/2016   Procedure: INSERTION OF MESH;  Surgeon: Michael Boston, MD;  Location: [place];  Service: General;  Laterality: Bilateral;  . IR GENERIC HISTORICAL  02/20/2016   IR REMOVAL TUN CV CATH W/O FL 02/20/2016 Corrie Mckusick, DO [facility]    Social History:   reports that he has never smoked. He has never used smokeless tobacco. He reports that he does not drink alcohol and does not use drugs.  No Known Allergies  Family History  Problem Relation Age of Onset  . Diabetes Mother   . Diabetes Sister   . Esophageal cancer Neg Hx   . Colon cancer Neg Hx   . Rectal cancer Neg Hx   . Stomach cancer Neg Hx      Prior to Admission medications   Medication Sig Start Date End Date Taking? Authorizing Provider  acetaminophen (TYLENOL) 500 MG tablet Take 1,000 mg by mouth every 6 (six) hours as needed for moderate pain.   Yes [provider]  amLODipine (NORVASC) 10 MG tablet Take 1 tablet (10 mg total) by mouth daily. 11/25/16  Yes Hawks, Christy A, FNP  Ascorbic Acid (VITAMIN C) 1000 MG tablet Take 1,000 mg by mouth daily.   Yes [provider]  carvedilol (COREG) 12.5 MG tablet Take 12.5 mg by mouth 2 (two) times daily with a meal.   Yes [provider]  cloNIDine (CATAPRES) 0.1 MG tablet Take 0.1 mg by mouth 2 (two) times daily.   Yes [provider]  hydrALAZINE (APRESOLINE) 50 MG tablet Take 1.5 tablets (75 mg total) by mouth 3 (three) times daily. Patient taking differently: Take 100 mg by mouth 3 (three) times daily.  11/24/16  Yes Herminio Commons, MD  insulin degludec (TRESIBA) 100 UNIT/ML SOPN FlexTouch Pen Inject 13 Units into the skin daily.    Yes [provider]  insulin lispro (HUMALOG) 100 UNIT/ML KwikPen Inject 0-20 Units into the skin  3 (three) times daily as needed (high blood sugar). Sliding scale. 03/07/18  Yes [provider]  levofloxacin (LEVAQUIN) 500 MG tablet Take 500 mg by mouth daily. Start date : 06/20/20   Yes [provider]  LIPITOR 20 MG tablet TAKE 1 BY MOUTH DAILY Patient taking differently: Take 20 mg by mouth daily.  01/14/17  Yes Hawks, Christy A, FNP  lisinopril (ZESTRIL) 2.5 MG tablet Take 2.5 mg by mouth daily.   Yes [provider]  ondansetron (ZOFRAN) 4 MG tablet Take 4 mg by mouth every 8 (eight) hours as needed for nausea or vomiting.   Yes [provider]  pantoprazole (PROTONIX) 20 MG tablet Take 20 mg by mouth 2 (two) times daily.   Yes [provider]  predniSONE (DELTASONE) 5 MG tablet Take 5 mg by mouth daily with breakfast.   Yes [provider]  sulfamethoxazole-trimethoprim (BACTRIM) 400-80 MG tablet Take  1 tablet by mouth See admin instructions. Takes 1 tablet daily 3 times a week-Monday, Wednesday and Friday   Yes [provider]  tacrolimus (PROGRAF) 1 MG capsule Take 0.5-1 mg by mouth See admin instructions. Takes 1 tablet in the morning and 1/2 tablet at night   Yes [provider]  tamsulosin (FLOMAX) 0.4 MG CAPS capsule Take 0.4 mg by mouth daily.   Yes [provider]  HYDROcodone-acetaminophen (NORCO/VICODIN) 5-325 MG tablet 1 po q 4-6 hrs prn for pain Patient not taking: Reported on 07/09/2020 11/09/17   Garald Balding, MD  metoCLOPramide (REGLAN) 5 MG tablet Take 1 tablet (5 mg total) by mouth 3 (three) times daily before meals. Patient not taking: Reported on 11/16/2017 05/19/17   Rogene Houston, MD  mycophenolate (MYFORTIC) 180 MG EC tablet Take 540 mg by mouth 2 (two) times daily.     [provider]    Physical Exam: Vitals:   07/16/2020 1700 07/18/2020 1730 07/15/2020 1745 06/27/2020 1915  BP: (!) 184/76 (!) 161/63 (!) 157/67 (!) 168/57  Pulse: 73 69 69 69  Resp: (!) 24 (!) 25 (!) 23 15   Temp:      TempSrc:      SpO2: 92% 96% 98% 98%  Weight:      Height:        Constitutional: NAD, calm  Eyes: PERTLA, lids and conjunctivae normal ENMT: Mucous membranes are moist. Posterior pharynx clear of any exudate or lesions.   Neck: normal, supple, no masses, no thyromegaly Respiratory: Dyspnea with speech, no wheezing. No pallor or cyanosis.  Cardiovascular: S1 & S2 heard, regular rate and rhythm. No extremity edema.  Abdomen: No distension, no tenderness, soft. Bowel sounds active.  Musculoskeletal: no clubbing / cyanosis. No joint deformity upper and lower extremities.   Skin: no significant rashes, lesions, ulcers. Warm, dry, well-perfused. Poor turgor.  Neurologic: CN 2-12 grossly intact. Sensation intact. Moving all extremities.  Psychiatric: Alert and oriented to person, place, and situation. Pleasant and cooperative.    Labs and Imaging on Admission: I have personally reviewed following labs and imaging studies  CBC: Recent Labs  Lab 07/13/2020 1538  WBC 7.8  NEUTROABS 7.1  HGB 14.4  HCT 44.5  MCV 82.7  PLT 400   Basic Metabolic Panel: Recent Labs  Lab 06/21/2020 1538  NA 127*  K 4.5  CL 96*  CO2 19*  GLUCOSE 282*  BUN 25*  CREATININE 1.18  CALCIUM 7.9*   GFR: Estimated Creatinine Clearance: 52.7 mL/min (by C-G formula based on SCr of 1.18 mg/dL). Liver Function Tests: Recent Labs  Lab 07/14/2020 1538  AST 35  ALT 15  ALKPHOS 44  BILITOT 0.9  PROT 5.8*  ALBUMIN 2.7*   Recent Labs  Lab 07/07/2020 1538  LIPASE 19   No results for input(s): AMMONIA in the last 168 hours. Coagulation Profile: No results for input(s): INR, PROTIME in the last 168 hours. Cardiac Enzymes: No results for input(s): CKTOTAL, CKMB, CKMBINDEX, TROPONINI in the last 168 hours. BNP (last 3 results) No results for input(s): PROBNP in the last 8760 hours. HbA1C: No results for input(s): HGBA1C in the last 72 hours. CBG: Recent Labs  Lab 06/20/2020 1834  GLUCAP 266*    Lipid Profile: Recent Labs    07/15/2020 1538  TRIG 204*   Thyroid Function Tests: No results for input(s): TSH, T4TOTAL, FREET4, T3FREE, THYROIDAB in the last 72 hours. Anemia Panel: Recent Labs    06/24/2020 1703  FERRITIN  182   Urine analysis:    Component Value Date/Time   COLORURINE YELLOW 07/01/2020 1528   APPEARANCEUR CLEAR 06/24/2020 1528   LABSPEC 1.027 06/26/2020 1528   PHURINE 5.0 07/02/2020 1528   GLUCOSEU >=500 (A) 06/30/2020 1528   HGBUR NEGATIVE 06/27/2020 1528   BILIRUBINUR NEGATIVE 07/04/2020 1528   KETONESUR 20 (A) 07/06/2020 1528   PROTEINUR >=300 (A) 07/01/2020 1528   NITRITE NEGATIVE 07/09/2020 1528   LEUKOCYTESUR NEGATIVE 07/14/2020 1528   Sepsis Labs: @LABRCNTIP (procalcitonin:4,lacticidven:4) )No results found for this or any previous visit (from the past 240 hour(s)).   Radiological Exams on Admission: CT ABDOMEN PELVIS W CONTRAST  Result Date: 06/24/2020 CLINICAL DATA:  Abdominal pain and fever. EXAM: CT ABDOMEN AND PELVIS WITH CONTRAST TECHNIQUE: Multidetector CT imaging of the abdomen and pelvis was performed using the standard protocol following bolus administration of intravenous contrast. CONTRAST:  176mL OMNIPAQUE IOHEXOL 300 MG/ML  SOLN COMPARISON:  None. FINDINGS: Lower chest: Marked severity multifocal infiltrates are seen throughout the bilateral lung bases. Hepatobiliary: There is mild diffuse fatty infiltration of the liver parenchyma. No focal liver abnormality is seen. No gallstones, gallbladder wall thickening, or biliary dilatation. Pancreas: Unremarkable. No pancreatic ductal dilatation or surrounding inflammatory changes. Spleen: Normal in size without focal abnormality. Adrenals/Urinary Tract: Adrenal glands are unremarkable. The native kidneys are atrophic in appearance, without renal calculi, focal lesion, or hydronephrosis. A normal appearing renal transplant is seen within the pelvis on the right. Bladder is unremarkable.  Stomach/Bowel: Stomach is within normal limits. Appendix appears normal. No evidence of bowel wall dilatation. Noninflamed diverticula are seen throughout the large bowel. Vascular/Lymphatic: Aortic atherosclerosis. No enlarged abdominal or pelvic lymph nodes. Reproductive: The prostate gland is mildly enlarged. Other: No abdominal wall hernia or abnormality. No abdominopelvic ascites. Musculoskeletal: A chronic compression fracture deformity is seen at the level of L1. IMPRESSION: 1. Marked severity multifocal bibasilar infiltrates. 2. Fatty liver. 3. Colonic diverticulosis. 4. Normal appearing renal transplant within the pelvis on the right. 5. Chronic compression fracture deformity of the L1 vertebral body. 6. Aortic atherosclerosis. Aortic Atherosclerosis (ICD10-I70.0). Electronically Signed   By: Virgina Norfolk M.D.   On: 07/16/2020 19:14   DG Chest Port 1 View  Result Date: 07/18/2020 CLINICAL DATA:  59 year old male with shortness of breath, positive COVID-19 times 10 days. EXAM: PORTABLE CHEST 1 VIEW COMPARISON:  [facility] Chest CTA 12/09/2016 and earlier. FINDINGS: Portable AP semi upright view at 1524 hours. Lung volumes and mediastinal contours appear stable since 2018 and within normal limits. There is hazy and indistinct opacity throughout much of the right lung, most confluent at the right lung base. Similar Patchy and confluent left lung base opacity. Visualized tracheal air column is within normal limits. No pneumothorax. No pleural effusion is evident. No acute osseous abnormality identified. IMPRESSION: Asymmetric right > left patchy and indistinct pulmonary opacity compatible with COVID-19 pneumonia in this setting. Electronically Signed   By: Genevie Ann M.D.   On: 07/12/2020 15:42     Assessment/Plan   1. COVID-19 pneumonia; suspected bacterial pneumonia; acute hypoxic respiratory failure  - Presents with progressive fatigue, malaise, and GI upset in setting of COVID-19  (positive on 12/3 at [facility]), and is found to be saturating in mid-80s on room air  - Procalcitonin is elevated and asymmetric opacities on CXR suspicious for concomitant bacterial PNA  - Start remdesivir, steroids, Rocephin, and azithromycin; continue supplemental O2 as needed, trend markers    2. Insulin-dependent DM  - A1c was 8.9%;  his DM has resulted in ESRD, now s/p transplant  - Continue CBG checks and insulin, linagliptin   3. Hypertension; hypertensive urgency  - BP was a high as 355 systolic in ED in setting of not taking his antihypertensives today; asymptomatic  - Improved with oral hydralazine in ED  - Continue Norvasc, hydralazine, Coreg, clonidine, and lisinopril    4. Hyponatremia  - Serum sodium is 127 on admission in setting of N/V/D with hypovolemia, corrects to 130 when accounting for hyperglycemia  - Start a gentle IVF hydration, repeat chem panel in am    5. Renal transplant recipient; CKD II-IIIa  - SCr is 1.18 in ED, appears close to baseline  - Renally-dose medications, hold Myfortic and continue Prograf per his renal transplant team recommendations, monitor renal function and electrolytes   6. Abdominal pain with nausea & vomiting  - Patient reports several days of upper abdominal pain, nausea, and non-bloody vomiting; also has had some loose stools  - Exam is benign, LFTs and lipase normal, and no acute findings on CT abdomen/pelvis   - Continue supportive care    DVT prophylaxis: Lovenox  Code Status: Full  Family Communication: Daughter updated from ED  Disposition Plan:  Patient is from: Home  Anticipated d/c is to: Home  Anticipated d/c date is: ~06/26/20 Patient currently: pending improvement in respiratory status and N/V  Consults called: None  Admission status: Inpatient     Vianne Bulls, MD [facility]  07/02/2020, 7:50 PM

## 2020-06-23 NOTE — ED Triage Notes (Signed)
Per EMS: Patient reports to [facility] via EMS from urgent care. Patient is reportedly COVID Positive. Patient was seen at [facility] x10 days ago and was noted to be COVID positive then. Patient is SOB and had to be placed on O2 via . Patient on 4L and is 96% on that Patient was reported to be 82% on RA.  Patient also reports nausea and was given 4mg  zofran IV.

## 2020-06-23 NOTE — ED Provider Notes (Signed)
Craig Fisher Provider Note   CSN: [id number] Arrival date & time: 06/24/2020  1414     History Chief Complaint  Patient presents with  . Covid Positive  . Shortness of Breath    Craig Fisher is a 59 y.o. male presenting for evaluation of abdominal pain and hypoxia.  Patient states he tested positive for Covid last week.  He states he has mostly been doing the same, though he has worsened abdominal pain.  He has no appetite, associated nausea and vomiting.  He went to the clinic today to get Covid treatment, however was found to be hypoxic and sent to the ER.  He did not receive treatment.  He does not wear oxygen at home.  No history of lung problems including COPD or asthma.  He does have a kidney transplant, is on immunosuppression.  No recent change in medications.  He reports a history of hypertension and diabetes, states he has not had his blood pressure medicine today.  He denies significant shortness of breath, chest pain, urinary symptoms.  Additional history obtained from patient's daughter, Craig Fisher, per phone.  She was in touch with the RN of the nephrology transplant team.  They recommend patient can stop Myfortic, but keep Prograf ongoing.  The main number for the clinic is [phone].  The PAL line is [phone].  Per facility, he is okay to stay at [facility], does not require transfer unless condition changes or there are further needs.  Additional history obtained from chart review.  Patient with a history of diabetes, kidney transplant, hypertension, hyperlipidemia GERD.  I reviewed patient's ED visit on 12-3 at [facility] including his positive Covid test.  Reviewed labs that showed mild hyponatremia at 128, glucose was elevated and bicarb of 20, however no anion gap.  HPI     Past Medical History:  Diagnosis Date  . Anemia   . Blind left eye   . Complication of anesthesia    Palpitations at dentist  . Diabetes mellitus without  complication ([place])    Type II  . Diabetic retinopathy ([place])   . ESRD (end stage renal disease) ([place])    Hemodialysis TTHS  . Full dentures   . Headache   . Hyperlipidemia   . Hypertension   . Shortness of breath dyspnea    " only once after surgery, when going to bed" 07/06/16, patient denies shortness of breath  . Wears glasses     Patient Active Problem List   Diagnosis Date Noted  . Acute hypoxemic respiratory failure due to COVID-19 ([place]) 06/30/2020  . Renal transplant recipient 06/26/2020  . Hyponatremia 07/15/2020  . Chronic kidney disease, stage 3a ([place]) 07/03/2020  . Hypertensive urgency 06/18/2020  . Pain of upper abdomen 03/16/2017  . Non-intractable vomiting with nausea 03/16/2017  . GERD (gastroesophageal reflux disease) 10/08/2016  . Peritoneal dialysis catheter placed 07/07/2016 07/07/2016  . ESRD (end stage renal disease) on dialysis ([place]) 07/07/2016  . ESRD on dialysis ([place]) 12/05/2015  . Chronic kidney disease requiring chronic dialysis ([place]) 10/21/2015  . Chronic kidney disease (CKD) stage G4/A1 04/07/2015  . Edema 08/20/2014  . Diabetes mellitus type 2 with complications, uncontrolled ([place]) 05/06/2014  . 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 Bilateral inguinal hernia (BIH) s/p lap repair w mesh 07/07/2016 01/23/2014  . Diabetic retinopathy ([place]) 03/30/2013  . Essential hypertension 12/09/2012  . Hyperlipidemia 12/09/2012  . Helicobacter pylori antibody positive 12/09/2012    Past Surgical  History:  Procedure Laterality Date  . AV FISTULA PLACEMENT Left 10/29/2015   Procedure: BRACHIOCEPHALIC ARTERIOVENOUS (AV) FISTULA CREATION;  Surgeon: Mal Misty, MD;  Location: [place];  Service: Vascular;  Laterality: Left;  . CAPD INSERTION N/A 07/07/2016   Procedure: LAPAROSCOPIC INSERTION CONTINUOUS AMBULATORY PERITONEAL DIALYSIS CATHETER WITH OMENTOPEXY;  Surgeon: Michael Boston, MD;  Location: [place];  Service: General;   Laterality: N/A;  . COLONOSCOPY     One small polyp  . ESOPHAGOGASTRODUODENOSCOPY N/A 04/05/2017   Procedure: ESOPHAGOGASTRODUODENOSCOPY (EGD);  Surgeon: Rogene Houston, MD;  [facility];  Service: Endoscopy;  Laterality: N/A;  12:15  . EYE SURGERY     both eyes-retine  . eyes     detached retina-lt  . INGUINAL HERNIA REPAIR Bilateral 07/07/2016   Procedure: REPAIR BILATERAL INGUINAL HERNIAS;  Surgeon: Michael Boston, MD;  Location: [place];  Service: General;  Laterality: Bilateral;  . INSERTION OF DIALYSIS CATHETER N/A 11/07/2015   Procedure: INSERTION OF DIALYSIS CATHETER;  Surgeon: Elam Dutch, MD;  Location: [place];  Service: Vascular;  Laterality: N/A;  . INSERTION OF MESH Bilateral 07/07/2016   Procedure: INSERTION OF MESH;  Surgeon: Michael Boston, MD;  Location: [place];  Service: General;  Laterality: Bilateral;  . IR GENERIC HISTORICAL  02/20/2016   IR REMOVAL TUN CV CATH W/O FL 02/20/2016 Corrie Mckusick, DO [facility]-INTERV RAD       Family History  Problem Relation Age of Onset  . Diabetes Mother   . Diabetes Sister   . Esophageal cancer Neg Hx   . Colon cancer Neg Hx   . Rectal cancer Neg Hx   . Stomach cancer Neg Hx     Social History   Tobacco Use  . Smoking status: Never Smoker  . Smokeless tobacco: Never Used  Vaping Use  . Vaping Use: Never used  Substance Use Topics  . Alcohol use: No    Alcohol/week: 0.0 standard drinks  . Drug use: No    Home Medications Prior to Admission medications   Medication Sig Start Date End Date Taking? Authorizing Provider  acetaminophen (TYLENOL) 500 MG tablet Take 1,000 mg by mouth every 6 (six) hours as needed for moderate pain.   Yes [provider]  amLODipine (NORVASC) 10 MG tablet Take 1 tablet (10 mg total) by mouth daily. 11/25/16  Yes Hawks, Christy A, FNP  Ascorbic Acid (VITAMIN C) 1000 MG tablet Take 1,000 mg by mouth daily.   Yes [provider]  carvedilol (COREG) 12.5 MG tablet Take 12.5 mg  by mouth 2 (two) times daily with a meal.   Yes [provider]  cloNIDine (CATAPRES) 0.1 MG tablet Take 0.1 mg by mouth 2 (two) times daily.   Yes [provider]  hydrALAZINE (APRESOLINE) 50 MG tablet Take 1.5 tablets (75 mg total) by mouth 3 (three) times daily. Patient taking differently: Take 100 mg by mouth 3 (three) times daily.  11/24/16  Yes Herminio Commons, MD  insulin degludec (TRESIBA) 100 UNIT/ML SOPN FlexTouch Pen Inject 13 Units into the skin daily.    Yes [provider]  insulin lispro (HUMALOG) 100 UNIT/ML KwikPen Inject 0-20 Units into the skin 3 (three) times daily as needed (high blood sugar). Sliding scale. 03/07/18  Yes [provider]  levofloxacin (LEVAQUIN) 500 MG tablet Take 500 mg by mouth daily. Start date : 06/20/20   Yes [provider]  LIPITOR 20 MG tablet TAKE 1 BY MOUTH DAILY Patient taking  differently: Take 20 mg by mouth daily.  01/14/17  Yes Hawks, Christy A, FNP  lisinopril (ZESTRIL) 2.5 MG tablet Take 2.5 mg by mouth daily.   Yes [provider]  ondansetron (ZOFRAN) 4 MG tablet Take 4 mg by mouth every 8 (eight) hours as needed for nausea or vomiting.   Yes [provider]  pantoprazole (PROTONIX) 20 MG tablet Take 20 mg by mouth 2 (two) times daily.   Yes [provider]  predniSONE (DELTASONE) 5 MG tablet Take 5 mg by mouth daily with breakfast.   Yes [provider]  sulfamethoxazole-trimethoprim (BACTRIM) 400-80 MG tablet Take 1 tablet by mouth See admin instructions. Takes 1 tablet daily 3 times a week-Monday, Wednesday and Friday   Yes [provider]  tacrolimus (PROGRAF) 1 MG capsule Take 0.5-1 mg by mouth See admin instructions. Takes 1 tablet in the morning and 1/2 tablet at night   Yes [provider]  tamsulosin (FLOMAX) 0.4 MG CAPS capsule Take 0.4 mg by mouth daily.   Yes [provider]  HYDROcodone-acetaminophen (NORCO/VICODIN) 5-325 MG  tablet 1 po q 4-6 hrs prn for pain Patient not taking: Reported on 07/17/2020 11/09/17   Garald Balding, MD  Insulin Degludec (TRESIBA FLEXTOUCH) 200 UNIT/ML SOPN Inject 20 Units into the skin daily. Patient not taking: Reported on 07/09/2020 10/21/16   Sharion Balloon, FNP  metoCLOPramide (REGLAN) 5 MG tablet Take 1 tablet (5 mg total) by mouth 3 (three) times daily before meals. Patient not taking: Reported on 11/16/2017 05/19/17   Rogene Houston, MD  mycophenolate (MYFORTIC) 180 MG EC tablet Take 540 mg by mouth 2 (two) times daily.     [provider]  PRILOSEC 20 MG capsule TAKE 1 BY MOUTH DAILY Patient not taking: Reported on 11/09/2017 01/07/17   Sharion Balloon, FNP    Allergies    Patient has no known allergies.  Review of Systems   Review of Systems  Gastrointestinal: Positive for abdominal pain, diarrhea, nausea and vomiting.  Allergic/Immunologic: Positive for immunocompromised state.  All other systems reviewed and are negative.   Physical Exam Updated Vital Signs BP (!) 168/57   Pulse 69   Temp (!) 100.7 F (38.2 C) (Oral)   Resp 15   Ht 5\' 2"  (1.575 m)   Wt 64 kg   SpO2 98%   BMI 25.81 kg/m   Physical Exam Vitals and nursing note reviewed.  Constitutional:      General: He is not in acute distress.    Appearance: He is well-developed. He is ill-appearing.     Comments: Appears chronically ill  HENT:     Head: Normocephalic and atraumatic.  Eyes:     Conjunctiva/sclera: Conjunctivae normal.     Pupils: Pupils are equal, round, and reactive to light.  Cardiovascular:     Rate and Rhythm: Normal rate and regular rhythm.     Pulses: Normal pulses.  Pulmonary:     Effort: Pulmonary effort is normal. No respiratory distress.     Breath sounds: Normal breath sounds. No wheezing.     Comments: SPO2 90% on 5 L via nasal cannula.  Speaking in full sentences.  No signs of respiratory distress. Abdominal:     General: There is no distension.      Palpations: Abdomen is soft. There is no mass.     Tenderness: There is abdominal tenderness. There is no guarding or rebound.     Comments: Mild diffuse ttp of  the abd  Musculoskeletal:        General: Normal range of motion.     Cervical back: Normal range of motion and neck supple.  Skin:    General: Skin is warm and dry.     Capillary Refill: Capillary refill takes less than 2 seconds.  Neurological:     Mental Status: He is alert and oriented to person, place, and time.     ED Results / Procedures / Treatments   Labs (all labs ordered are listed, but only abnormal results are displayed) Labs Reviewed  CBC WITH DIFFERENTIAL/PLATELET - Abnormal; Notable for the following components:      Result Value   Lymphs Abs 0.5 (*)    All other components within normal limits  COMPREHENSIVE METABOLIC PANEL - Abnormal; Notable for the following components:   Sodium 127 (*)    Chloride 96 (*)    CO2 19 (*)    Glucose, Bld 282 (*)    BUN 25 (*)    Calcium 7.9 (*)    Total Protein 5.8 (*)    Albumin 2.7 (*)    All other components within normal limits  D-DIMER, QUANTITATIVE (NOT AT [facility]) - Abnormal; Notable for the following components:   D-Dimer, Quant 0.77 (*)    All other components within normal limits  LACTATE DEHYDROGENASE - Abnormal; Notable for the following components:   LDH 522 (*)    All other components within normal limits  TRIGLYCERIDES - Abnormal; Notable for the following components:   Triglycerides 204 (*)    All other components within normal limits  FIBRINOGEN - Abnormal; Notable for the following components:   Fibrinogen >800 (*)    All other components within normal limits  URINALYSIS, ROUTINE W REFLEX MICROSCOPIC - Abnormal; Notable for the following components:   Glucose, UA >=500 (*)    Ketones, ur 20 (*)    Protein, ur >=300 (*)    All other components within normal limits  CBG MONITORING, ED - Abnormal; Notable for the following components:    Glucose-Capillary 266 (*)    All other components within normal limits  CULTURE, BLOOD (ROUTINE X 2)  CULTURE, BLOOD (ROUTINE X 2)  EXPECTORATED SPUTUM ASSESSMENT W REFEX TO RESP CULTURE  LACTIC ACID, PLASMA  LACTIC ACID, PLASMA  PROCALCITONIN  LIPASE, BLOOD  C-REACTIVE PROTEIN  FERRITIN  LEGIONELLA PNEUMOPHILA SEROGP 1 UR AG  STREP PNEUMONIAE URINARY ANTIGEN  CBG MONITORING, ED    EKG None  Radiology CT ABDOMEN PELVIS W CONTRAST  Result Date: 07/13/2020 CLINICAL DATA:  Abdominal pain and fever. EXAM: CT ABDOMEN AND PELVIS WITH CONTRAST TECHNIQUE: Multidetector CT imaging of the abdomen and pelvis was performed using the standard protocol following bolus administration of intravenous contrast. CONTRAST:  131mL OMNIPAQUE IOHEXOL 300 MG/ML  SOLN COMPARISON:  None. FINDINGS: Lower chest: Marked severity multifocal infiltrates are seen throughout the bilateral lung bases. Hepatobiliary: There is mild diffuse fatty infiltration of the liver parenchyma. No focal liver abnormality is seen. No gallstones, gallbladder wall thickening, or biliary dilatation. Pancreas: Unremarkable. No pancreatic ductal dilatation or surrounding inflammatory changes. Spleen: Normal in size without focal abnormality. Adrenals/Urinary Tract: Adrenal glands are unremarkable. The native kidneys are atrophic in appearance, without renal calculi, focal lesion, or hydronephrosis. A normal appearing renal transplant is seen within the pelvis on the right. Bladder is unremarkable. Stomach/Bowel: Stomach is within normal limits. Appendix appears normal. No evidence of bowel wall dilatation. Noninflamed diverticula are seen throughout the large bowel. Vascular/Lymphatic: Aortic  atherosclerosis. No enlarged abdominal or pelvic lymph nodes. Reproductive: The prostate gland is mildly enlarged. Other: No abdominal wall hernia or abnormality. No abdominopelvic ascites. Musculoskeletal: A chronic compression fracture deformity is seen at  the level of L1. IMPRESSION: 1. Marked severity multifocal bibasilar infiltrates. 2. Fatty liver. 3. Colonic diverticulosis. 4. Normal appearing renal transplant within the pelvis on the right. 5. Chronic compression fracture deformity of the L1 vertebral body. 6. Aortic atherosclerosis. Aortic Atherosclerosis (ICD10-I70.0). Electronically Signed   By: Virgina Norfolk M.D.   On: 06/25/2020 19:14   DG Chest Port 1 View  Result Date: 07/03/2020 CLINICAL DATA:  59 year old male with shortness of breath, positive COVID-19 times 10 days. EXAM: PORTABLE CHEST 1 VIEW COMPARISON:  [facility] Chest CTA 12/09/2016 and earlier. FINDINGS: Portable AP semi upright view at 1524 hours. Lung volumes and mediastinal contours appear stable since 2018 and within normal limits. There is hazy and indistinct opacity throughout much of the right lung, most confluent at the right lung base. Similar Patchy and confluent left lung base opacity. Visualized tracheal air column is within normal limits. No pneumothorax. No pleural effusion is evident. No acute osseous abnormality identified. IMPRESSION: Asymmetric right > left patchy and indistinct pulmonary opacity compatible with COVID-19 pneumonia in this setting. Electronically Signed   By: Genevie Ann M.D.   On: 07/06/2020 15:42    Procedures Procedures (including critical care time)  Medications Ordered in ED Medications  cefTRIAXone (ROCEPHIN) 2 g in sodium chloride 0.9 % 100 mL IVPB (has no administration in time range)  azithromycin (ZITHROMAX) 500 mg in sodium chloride 0.9 % 250 mL IVPB (has no administration in time range)  acetaminophen (TYLENOL) tablet 650 mg (650 mg Oral Given 07/13/2020 1529)  ondansetron (ZOFRAN) injection 4 mg (4 mg Intravenous Given 07/09/2020 1530)  fentaNYL (SUBLIMAZE) injection 50 mcg (50 mcg Intravenous Given 06/22/2020 1532)  hydrALAZINE (APRESOLINE) tablet 75 mg (75 mg Oral Given 06/22/2020 1658)  insulin aspart (novoLOG) injection 5 Units (5  Units Subcutaneous Given 07/09/2020 1910)  iohexol (OMNIPAQUE) 300 MG/ML solution 100 mL (100 mLs Intravenous Contrast Given 07/01/2020 1840)    ED Course  I have reviewed the triage vital signs and the nursing notes.  Pertinent labs & imaging results that were available during my care of the patient were reviewed by me and considered in my medical decision making (see chart for details).  Clinical Course as of Jun 24 1943  Mon Jun 23, 5034  5555 59 year old male immunosuppressed with kidney transplant here with increased shortness of breath from Covid clinic.  Found to have sats in the low 80s.  Improved with nasal cannula oxygen.  Will need admission to the hospital for further work-up.   [MB]    Clinical Course User Index [MB] Hayden Rasmussen, MD   MDM Rules/Calculators/A&P                          Patient presenting for evaluation of hypoxia and Covid and abdominal pain.  On exam, patient is chronically ill.  He is immunosuppressed due to kidney transplant.  Tested positive for Covid several days ago.  Hypoxic on room air in the 80s, on 5 L he is at 90%.  However, patient also with worsening abdominal pain, recently found to have elevated glucose and mild acidosis at 20.  He will need repeat labs to ensure no DKA.  He will need further abdominal work-up due to his immunocompromised status prior  to admission for Covid hypoxia.  Of note, patient's blood pressure is elevated, but he did not take his medicine today.    Labs interpreted by me, consistent with Covid as patient has elevated inflammatory markers.  He does have hyperglycemia at 282, bicarb mildly low at 19.  He has pseudohyponatremia, when corrected is 130.  Will give insulin, but will hold on large fluid bolus, in an attempt for fluid restriction in the setting of Covid with hypoxia. Chest x-ray viewed interpreted by me, shows opacities consistent with Covid.   CT abdomen pelvis negative for acute findings, once again shows  multifocal pneumonia.  Will call for admission.  Discussed with Dr. Gloris Ham from [facility], pt to be admitted.   Final Clinical Impression(s) / ED Diagnoses Final diagnoses:  COVID-19  Hypoxia  Hyperglycemia    Rx / DC Orders ED Discharge Orders    None       Franchot Heidelberg, PA-C 06/25/2020 1944    Hayden Rasmussen, MD 06/24/20 1004

## 2020-06-24 LAB — CBC WITH DIFFERENTIAL/PLATELET
Abs Immature Granulocytes: 0.01 10*3/uL (ref 0.00–0.07)
Basophils Absolute: 0 10*3/uL (ref 0.0–0.1)
Basophils Relative: 0 %
Eosinophils Absolute: 0 10*3/uL (ref 0.0–0.5)
Eosinophils Relative: 0 %
HCT: 41.9 % (ref 39.0–52.0)
Hemoglobin: 13.6 g/dL (ref 13.0–17.0)
Immature Granulocytes: 0 %
Lymphocytes Relative: 7 %
Lymphs Abs: 0.4 10*3/uL — ABNORMAL LOW (ref 0.7–4.0)
MCH: 26.6 pg (ref 26.0–34.0)
MCHC: 32.5 g/dL (ref 30.0–36.0)
MCV: 81.8 fL (ref 80.0–100.0)
Monocytes Absolute: 0.1 10*3/uL (ref 0.1–1.0)
Monocytes Relative: 2 %
Neutro Abs: 4.3 10*3/uL (ref 1.7–7.7)
Neutrophils Relative %: 91 %
Platelets: 181 10*3/uL (ref 150–400)
RBC: 5.12 MIL/uL (ref 4.22–5.81)
RDW: 15.1 % (ref 11.5–15.5)
WBC: 4.7 10*3/uL (ref 4.0–10.5)
nRBC: 0 % (ref 0.0–0.2)

## 2020-06-24 LAB — COMPREHENSIVE METABOLIC PANEL
ALT: 17 U/L (ref 0–44)
AST: 30 U/L (ref 15–41)
Albumin: 2.5 g/dL — ABNORMAL LOW (ref 3.5–5.0)
Alkaline Phosphatase: 44 U/L (ref 38–126)
Anion gap: 12 (ref 5–15)
BUN: 23 mg/dL — ABNORMAL HIGH (ref 6–20)
CO2: 20 mmol/L — ABNORMAL LOW (ref 22–32)
Calcium: 7.8 mg/dL — ABNORMAL LOW (ref 8.9–10.3)
Chloride: 100 mmol/L (ref 98–111)
Creatinine, Ser: 0.92 mg/dL (ref 0.61–1.24)
GFR, Estimated: >60 mL/min (ref >60)
Glucose, Bld: 190 mg/dL — ABNORMAL HIGH (ref 70–99)
Potassium: 4.4 mmol/L (ref 3.5–5.1)
Sodium: 132 mmol/L — ABNORMAL LOW (ref 135–145)
Total Bilirubin: 0.5 mg/dL (ref 0.3–1.2)
Total Protein: 5.7 g/dL — ABNORMAL LOW (ref 6.5–8.1)

## 2020-06-24 LAB — C-REACTIVE PROTEIN: CRP: 19.2 mg/dL — ABNORMAL HIGH (ref <1.0)

## 2020-06-24 LAB — PROCALCITONIN: Procalcitonin: 1.09 ng/mL

## 2020-06-24 LAB — MAGNESIUM: Magnesium: 2.2 mg/dL (ref 1.7–2.4)

## 2020-06-24 LAB — GLUCOSE, CAPILLARY
Glucose-Capillary: 202 mg/dL — ABNORMAL HIGH (ref 70–99)
Glucose-Capillary: 193 mg/dL — ABNORMAL HIGH (ref 70–99)
Glucose-Capillary: 250 mg/dL — ABNORMAL HIGH (ref 70–99)
Glucose-Capillary: 307 mg/dL — ABNORMAL HIGH (ref 70–99)
Glucose-Capillary: 413 mg/dL — ABNORMAL HIGH (ref 70–99)

## 2020-06-24 LAB — LEGIONELLA PNEUMOPHILA SEROGP 1 UR AG: L. pneumophila Serogp 1 Ur Ag: NEGATIVE

## 2020-06-24 LAB — HEMOGLOBIN A1C
Hgb A1c MFr Bld: 8.6 % — ABNORMAL HIGH (ref 4.8–5.6)
Mean Plasma Glucose: 200.12 mg/dL

## 2020-06-24 LAB — FERRITIN: Ferritin: 1055 ng/mL — ABNORMAL HIGH (ref 24–336)

## 2020-06-24 LAB — D-DIMER, QUANTITATIVE: D-Dimer, Quant: 0.63 ug/mL-FEU — ABNORMAL HIGH (ref 0.00–0.50)

## 2020-06-24 LAB — HIV ANTIBODY (ROUTINE TESTING W REFLEX): HIV Screen 4th Generation wRfx: NONREACTIVE

## 2020-06-24 MED ORDER — ORAL CARE MOUTH RINSE
15.0000 mL | Freq: Two times a day (BID) | OROMUCOSAL | Status: DC
  Administered 2020-06-24 – 2020-07-07 (×27): 15 mL via OROMUCOSAL

## 2020-06-24 MED ORDER — LINAGLIPTIN 5 MG PO TABS
5.0000 mg | ORAL_TABLET | Freq: Every day | ORAL | Status: DC
  Administered 2020-06-24 – 2020-07-03 (×6): 5 mg via ORAL
  Filled 2020-06-24 (×8): qty 1

## 2020-06-24 MED ORDER — INSULIN ASPART 100 UNIT/ML ~~LOC~~ SOLN: 5.0000 [IU] | Freq: Once | SUBCUTANEOUS | Status: DC

## 2020-06-24 MED ORDER — INSULIN ASPART 100 UNIT/ML ~~LOC~~ SOLN
5.0000 [IU] | Freq: Once | SUBCUTANEOUS | Status: AC
  Administered 2020-06-24: 5 [IU] via SUBCUTANEOUS

## 2020-06-24 MED ORDER — INSULIN ASPART 100 UNIT/ML ~~LOC~~ SOLN
0.0000 [IU] | Freq: Every day | SUBCUTANEOUS | Status: DC
  Administered 2020-06-27: 5 [IU] via SUBCUTANEOUS

## 2020-06-24 MED ORDER — OXYCODONE HCL 5 MG PO TABS
5.0000 mg | ORAL_TABLET | ORAL | Status: DC | PRN
  Administered 2020-06-24 – 2020-06-27 (×4): 5 mg via ORAL
  Filled 2020-06-24 (×4): qty 1

## 2020-06-24 MED ORDER — INSULIN ASPART 100 UNIT/ML ~~LOC~~ SOLN
0.0000 [IU] | Freq: Three times a day (TID) | SUBCUTANEOUS | Status: DC
  Administered 2020-06-25: 5 [IU] via SUBCUTANEOUS
  Administered 2020-06-25: 11 [IU] via SUBCUTANEOUS
  Administered 2020-06-28: 13:00:00 8 [IU] via SUBCUTANEOUS
  Administered 2020-06-28: 18:00:00 5 [IU] via SUBCUTANEOUS
  Administered 2020-06-28 – 2020-07-01 (×2): 11 [IU] via SUBCUTANEOUS
  Administered 2020-07-01: 18:00:00 5 [IU] via SUBCUTANEOUS
  Administered 2020-07-01: 09:00:00 11 [IU] via SUBCUTANEOUS
  Administered 2020-07-03: 13:00:00 15 [IU] via SUBCUTANEOUS
  Administered 2020-07-03: 09:00:00 11 [IU] via SUBCUTANEOUS

## 2020-06-24 MED ORDER — INSULIN DETEMIR 100 UNIT/ML ~~LOC~~ SOLN
0.1500 [IU]/kg | Freq: Two times a day (BID) | SUBCUTANEOUS | Status: DC
  Administered 2020-06-24 – 2020-06-25 (×2): 10 [IU] via SUBCUTANEOUS
  Filled 2020-06-24 (×2): qty 0.1

## 2020-06-24 MED ORDER — INSULIN ASPART 100 UNIT/ML ~~LOC~~ SOLN
3.0000 [IU] | Freq: Three times a day (TID) | SUBCUTANEOUS | Status: DC
  Administered 2020-06-25 – 2020-06-28 (×6): 3 [IU] via SUBCUTANEOUS

## 2020-06-24 MED ORDER — METHYLPREDNISOLONE SODIUM SUCC 125 MG IJ SOLR
60.0000 mg | Freq: Two times a day (BID) | INTRAMUSCULAR | Status: DC
  Administered 2020-06-24 – 2020-07-03 (×19): 60 mg via INTRAVENOUS
  Filled 2020-06-24 (×19): qty 2

## 2020-06-24 NOTE — Progress Notes (Signed)
[facility] Progress Note  Patient: Craig Fisher    GXQ:[id number]  DOA: 06/22/2020     Date of Service: the patient was seen and examined on 06/24/2020  Brief hospital course: Past medical history of ESRD currently not on HD after renal transplant.  Renal transplant 2019, CKD 3A, IDDM, HTN.  Presents with complaints of cough and shortness of breath as well as abdominal pain. Currently plan is supportive care further work-up.  Assessment and Plan: 1. Acute COVID-19 Viral Pneumonia Acute hypoxic respiratory failure POA CXR: hazy bilateral peripheral opacities Oxygen requirement: Currently on 7 LPM, 83% CRP: 19.2 Remdesivir: Initiated on 06/19/2020 Steroids: Started on Solu-Medrol Baricitinib/Actemra(off-label use): Currently not a candidate given his abdominal pain and diarrhea history The investigational nature of this medication was discussed with the patient/HCPOA and they choose to proceed as the potential benefits are felt to outweigh risks at this time.  Antibiotics: Continuing ceftriaxone and azithromycin with concern for secondary bacterial pneumonia Vitamin C and Zinc: Continue DVT Prophylaxis: enoxaparin (LOVENOX) injection 40 mg Start: 07/05/2020 2200 Prone positioning and incentive spirometer use recommended.  The treatment plan and use of medications and known side effects were discussed with patient/family. It was clearly explained that Complete risks and long-term side effects are unknown, however in the best clinical judgment they seem to be of some clinical benefit rather than medical risks. Patient/family agree with the treatment plan and want to receive these treatments as indicated.   2.  Abdominal pain with nausea and vomiting Continues to have some abdominal pain right now but CT abdomen unremarkable. Continue to monitor.  Likely is here with Covid.  3.  Insulin-dependent diabetes. A1c 8.9. Currently on sliding scale insulin.  Monitor.  4.  History of renal  transplant. Not on HD.  CKD 3 8 Serum creatinine is stable.  Monitor. Holding Myfortic continue Prograf.  Continue steroids.  5.  Essential hypertension Continue home medication.  6.  Hyponatremia Sodium stable.  Continue to monitor.  Diet: Cardiac diet DVT Prophylaxis:   enoxaparin (LOVENOX) injection 40 mg Start: 07/02/2020 2200    Advance goals of care discussion: Full code  Family Communication: no family was present at bedside, at the time of interview.   Disposition:  Status is: Inpatient  Remains inpatient appropriate because:IV treatments appropriate due to intensity of illness or inability to take PO   Dispo: The patient is from: Home              Anticipated d/c is to: Home              Anticipated d/c date is: 3 days              Patient currently is not medically stable to d/c.        Subjective: No nausea no vomiting but no fever no chills.  Significant cough.  Significant abdominal pain.    Physical Exam:  General: Appear in mild distress, no Rash; Oral Mucosa Clear, moist. no Abnormal Neck Mass Or lumps, Conjunctiva normal  Cardiovascular: S1 and S2 Present, no Murmur, Respiratory: good respiratory effort, Bilateral Air entry present and CTA, no Crackles, no wheezes Abdomen: Bowel Sound present, Soft and mild difuse tenderness Extremities: no Pedal edema Neurology: alert and oriented to time, place, and person affect appropriate. no new focal deficit Gait not checked due to patient safety concerns  Vitals:   06/24/20 0645 06/24/20 0937 06/24/20 1404 06/24/20 2020  BP:  (!) 163/72 91/70 (!) 101/42  Pulse:  65 (!)  57 62  Resp: 17 16 17 19   Temp:  98 F (36.7 C) 97.6 F (36.4 C) (!) 97.3 F (36.3 C)  TempSrc:  Oral Oral   SpO2:  91% (!) 83% (!) 85%  Weight:      Height:        Intake/Output Summary (Last 24 hours) at 06/24/2020 2102 Last data filed at 06/24/2020 1818 Gross per 24 hour  Intake 1735.91 ml  Output 125 ml  Net 1610.91 ml    Filed Weights   06/30/2020 1437  Weight: 64 kg    Data Reviewed: I have personally reviewed and interpreted daily labs, tele strips, imagings as discussed above. I reviewed all nursing notes, pharmacy notes, vitals, pertinent old records I have discussed plan of care as described above with RN and patient/family.  CBC: Recent Labs  Lab 06/30/2020 1538 06/24/20 0423  WBC 7.8 4.7  NEUTROABS 7.1 4.3  HGB 14.4 13.6  HCT 44.5 41.9  MCV 82.7 81.8  PLT 195 982   Basic Metabolic Panel: Recent Labs  Lab 07/15/2020 1538 06/24/20 0423  NA 127* 132*  K 4.5 4.4  CL 96* 100  CO2 19* 20*  GLUCOSE 282* 190*  BUN 25* 23*  CREATININE 1.18 0.92  CALCIUM 7.9* 7.8*  MG  --  2.2    Studies: No results found.  Scheduled Meds: . amLODipine  10 mg Oral Daily  . atorvastatin  20 mg Oral Daily  . carvedilol  12.5 mg Oral BID WC  . cloNIDine  0.1 mg Oral BID  . enoxaparin (LOVENOX) injection  40 mg Subcutaneous Q24H  . hydrALAZINE  75 mg Oral TID  . insulin aspart  0-5 Units Subcutaneous QHS  . insulin aspart  0-9 Units Subcutaneous TID WC  . insulin aspart  5 Units Subcutaneous Once  . insulin glargine  10 Units Subcutaneous QHS  . linagliptin  5 mg Oral Daily  . lisinopril  2.5 mg Oral Daily  . mouth rinse  15 mL Mouth Rinse BID  . methylPREDNISolone (SOLU-MEDROL) injection  60 mg Intravenous Q12H  . pantoprazole  20 mg Oral BID  . sodium chloride flush  3 mL Intravenous Q12H  . tacrolimus  1 mg Oral Daily   And  . tacrolimus  0.5 mg Oral QHS  . tamsulosin  0.4 mg Oral Daily   Continuous Infusions: . azithromycin 500 mg (06/24/20 2057)  . cefTRIAXone (ROCEPHIN)  IV 2 g (06/24/20 1956)  . remdesivir 100 mg in NS 100 mL 100 mg (06/24/20 0956)   PRN Meds: acetaminophen, ondansetron **OR** ondansetron (ZOFRAN) IV, oxyCODONE, senna  Time spent: 35 minutes  Author: Berle Mull, MD [facility] 06/24/2020 9:02 PM  To reach On-call, see care teams to locate the attending and  reach out via [email or web address]. Between 7PM-7AM, please contact night-coverage If you still have difficulty reaching the attending provider, please page the [facility] (Director on Call) for [facility] on amion for assistance.

## 2020-06-24 NOTE — Plan of Care (Signed)
Discussed with patient plan of care for the remainder of the shift, pain management, admission questions, how to use his incentive spirometry and flutter valve with some teach back displayed.  Problem: Education: Goal: Knowledge of risk factors and measures for prevention of condition will improve Outcome: Progressing   Problem: Coping: Goal: Psychosocial and spiritual needs will be supported Outcome: Progressing   Problem: Respiratory: Goal: Ability to maintain a clear airway will improve Outcome: Progressing   Problem: Pain Managment: Goal: General experience of comfort will improve Outcome: Progressing

## 2020-06-25 DIAGNOSIS — R739 Hyperglycemia, unspecified: Secondary | ICD-10-CM

## 2020-06-25 LAB — GLUCOSE, CAPILLARY
Glucose-Capillary: 332 mg/dL — ABNORMAL HIGH (ref 70–99)
Glucose-Capillary: 220 mg/dL — ABNORMAL HIGH (ref 70–99)
Glucose-Capillary: 104 mg/dL — ABNORMAL HIGH (ref 70–99)
Glucose-Capillary: 67 mg/dL — ABNORMAL LOW (ref 70–99)
Glucose-Capillary: 72 mg/dL (ref 70–99)
Glucose-Capillary: 50 mg/dL — ABNORMAL LOW (ref 70–99)
Glucose-Capillary: 102 mg/dL — ABNORMAL HIGH (ref 70–99)
Glucose-Capillary: 93 mg/dL (ref 70–99)

## 2020-06-25 LAB — CBC WITH DIFFERENTIAL/PLATELET
Abs Immature Granulocytes: 0.02 10*3/uL (ref 0.00–0.07)
Basophils Absolute: 0 10*3/uL (ref 0.0–0.1)
Basophils Relative: 0 %
Eosinophils Absolute: 0 10*3/uL (ref 0.0–0.5)
Eosinophils Relative: 0 %
HCT: 39.4 % (ref 39.0–52.0)
Hemoglobin: 12.9 g/dL — ABNORMAL LOW (ref 13.0–17.0)
Immature Granulocytes: 0 %
Lymphocytes Relative: 4 %
Lymphs Abs: 0.2 10*3/uL — ABNORMAL LOW (ref 0.7–4.0)
MCH: 26.3 pg (ref 26.0–34.0)
MCHC: 32.7 g/dL (ref 30.0–36.0)
MCV: 80.2 fL (ref 80.0–100.0)
Monocytes Absolute: 0.3 10*3/uL (ref 0.1–1.0)
Monocytes Relative: 5 %
Neutro Abs: 5.2 10*3/uL (ref 1.7–7.7)
Neutrophils Relative %: 91 %
Platelets: 180 10*3/uL (ref 150–400)
RBC: 4.91 MIL/uL (ref 4.22–5.81)
RDW: 15.2 % (ref 11.5–15.5)
WBC: 5.7 10*3/uL (ref 4.0–10.5)
nRBC: 0 % (ref 0.0–0.2)

## 2020-06-25 LAB — FERRITIN: Ferritin: 1643 ng/mL — ABNORMAL HIGH (ref 24–336)

## 2020-06-25 LAB — C-REACTIVE PROTEIN: CRP: 12.2 mg/dL — ABNORMAL HIGH (ref <1.0)

## 2020-06-25 LAB — COMPREHENSIVE METABOLIC PANEL
ALT: 26 U/L (ref 0–44)
AST: 41 U/L (ref 15–41)
Albumin: 2.2 g/dL — ABNORMAL LOW (ref 3.5–5.0)
Alkaline Phosphatase: 47 U/L (ref 38–126)
Anion gap: 10 (ref 5–15)
BUN: 39 mg/dL — ABNORMAL HIGH (ref 6–20)
CO2: 20 mmol/L — ABNORMAL LOW (ref 22–32)
Calcium: 7.6 mg/dL — ABNORMAL LOW (ref 8.9–10.3)
Chloride: 101 mmol/L (ref 98–111)
Creatinine, Ser: 1.29 mg/dL — ABNORMAL HIGH (ref 0.61–1.24)
GFR, Estimated: >60 mL/min (ref >60)
Glucose, Bld: 412 mg/dL — ABNORMAL HIGH (ref 70–99)
Potassium: 3.7 mmol/L (ref 3.5–5.1)
Sodium: 131 mmol/L — ABNORMAL LOW (ref 135–145)
Total Bilirubin: 0.5 mg/dL (ref 0.3–1.2)
Total Protein: 5 g/dL — ABNORMAL LOW (ref 6.5–8.1)

## 2020-06-25 LAB — PROCALCITONIN: Procalcitonin: 0.97 ng/mL

## 2020-06-25 LAB — MAGNESIUM: Magnesium: 2.3 mg/dL (ref 1.7–2.4)

## 2020-06-25 LAB — D-DIMER, QUANTITATIVE: D-Dimer, Quant: 1.35 ug/mL-FEU — ABNORMAL HIGH (ref 0.00–0.50)

## 2020-06-25 MED ORDER — SODIUM CHLORIDE 0.9 % IV BOLUS
500.0000 mL | Freq: Once | INTRAVENOUS | Status: AC
  Administered 2020-06-25: 500 mL via INTRAVENOUS

## 2020-06-25 MED ORDER — INSULIN DETEMIR 100 UNIT/ML ~~LOC~~ SOLN
15.0000 [IU] | Freq: Two times a day (BID) | SUBCUTANEOUS | Status: DC
  Administered 2020-06-25: 15 [IU] via SUBCUTANEOUS
  Filled 2020-06-25 (×2): qty 0.15

## 2020-06-25 NOTE — Plan of Care (Signed)
  Problem: Respiratory: Goal: Will maintain a patent airway Outcome: Progressing   Problem: Activity: Goal: Ability to tolerate increased activity will improve Outcome: Progressing   Problem: Pain Managment: Goal: General experience of comfort will improve Outcome: Progressing   Problem: Skin Integrity: Goal: Risk for impaired skin integrity will decrease Outcome: Progressing

## 2020-06-25 NOTE — Progress Notes (Signed)
PROGRESS NOTE  Craig Fisher GDJ:[id number] DOB: 1960/12/23 DOA: 06/25/2020 PCP: Sharion Balloon, FNP   LOS: 2 days   Brief Narrative / Interim history: 59 year old male with ESRD due to uncontrolled diabetes mellitus status post renal transplantation in 2019, CKD 3A, IDDM, HTN, diagnosed with COVID-19 on 12/3 came into the hospital with shortness of breath and hypoxia.  He has been having abdominal discomfort, nausea, vomiting and diarrhea for the past week.  Has felt weak, worsening fatigue and malaised.  He was supposed to get monoclonal antibodies on 12/6 but he was hypoxic and was admitted to the hospital.  His renal transplant team has been contacted and advised holding the Myfortic but continuing Prograf.  Subjective / 24h Interval events: Reported abdominal pain overnight, improved this morning.  Still had an episode of diarrhea.  Reports shortness of breath with minimal activities  Assessment & Plan:  Principal Problem Acute Hypoxic Respiratory Failure due to Covid-19 Viral Illness -Patient is vaccinated however his immunosuppressed given renal transplant and immunosuppressive's at home. -Still quite hypoxic requiring 10 L high flow nasal cannula -Continue Remdesivir, steroids, not a candidate for Actemra given chronic immunosuppression -He was also started on antibiotics for bacterial pneumonia, continue for 5 total days   COVID-19 Labs  Recent Labs    06/28/2020 1538 07/12/2020 1703 06/24/20 0423 06/25/20 0435  DDIMER 0.77*  --  0.63* 1.35*  FERRITIN  --  182 1,055* 1,643*  LDH 522*  --   --   --   CRP  --  0.5 19.2* 12.2*    Active Problems Renal transplant recipient, history of ESRD -Creatinine 1.29 this morning, it was 1.18 on admission.  This appears close to his baseline however given GI losses and poor p.o. intake will do limited fluids today -Continue tacrolimus, hold mycophenolate  Hypertension -Continue Norvasc, Coreg, lisinopril, clonidine, hydralazine.  Blood  pressure is stable.  Abdominal pain, nausea, vomiting -Abdominal pain seems to be better.  CT scan on admission unremarkable for acute findings.  This is probably due to Covid  Hyperlipidemia -Continue statin  Hyponatremia -Slightly dehydrated, will see how he does with fluids  Insulin-dependent diabetes mellitus -Poorly controlled, now with steroid-induced hyperglycemia, A1c 8.9.  Increase Levemir today, continue scheduled along with sliding scale.  CBG (last 3)  Recent Labs    06/24/20 2019 06/25/20 0807 06/25/20 1109  GLUCAP 413* 332* 220*    Scheduled Meds: . amLODipine  10 mg Oral Daily  . atorvastatin  20 mg Oral Daily  . carvedilol  12.5 mg Oral BID WC  . cloNIDine  0.1 mg Oral BID  . enoxaparin (LOVENOX) injection  40 mg Subcutaneous Q24H  . hydrALAZINE  75 mg Oral TID  . insulin aspart  0-15 Units Subcutaneous TID WC  . insulin aspart  0-5 Units Subcutaneous QHS  . insulin aspart  3 Units Subcutaneous TID WC  . insulin detemir  0.15 Units/kg Subcutaneous BID  . linagliptin  5 mg Oral Daily  . lisinopril  2.5 mg Oral Daily  . mouth rinse  15 mL Mouth Rinse BID  . methylPREDNISolone (SOLU-MEDROL) injection  60 mg Intravenous Q12H  . pantoprazole  20 mg Oral BID  . sodium chloride flush  3 mL Intravenous Q12H  . tacrolimus  1 mg Oral Daily   And  . tacrolimus  0.5 mg Oral QHS  . tamsulosin  0.4 mg Oral Daily   Continuous Infusions: . azithromycin 500 mg (06/24/20 2057)  . cefTRIAXone (ROCEPHIN)  IV 2  g (06/24/20 1956)  . remdesivir 100 mg in NS 100 mL 100 mg (06/25/20 0907)   PRN Meds:.acetaminophen, ondansetron **OR** ondansetron (ZOFRAN) IV, oxyCODONE, senna  DVT prophylaxis: Lovenox Code Status: Full code Family Communication: Discussed with patient in detail   Status is: Inpatient  Remains inpatient appropriate because:Inpatient level of care appropriate due to severity of illness   Dispo: The patient is from: Home              Anticipated d/c  is to: Home              Anticipated d/c date is: > 3 days              Patient currently is not medically stable to d/c.  Consultants:  None   Procedures:  None   Microbiology: None   Antibacterials: Ceftriaxone / Azithromycin    Objective: Vitals:   06/24/20 2020 06/25/20 0426 06/25/20 0842 06/25/20 0853  BP: (!) 101/42 (!) 122/49  (!) 118/49  Pulse: 62 (!) 59  (!) 59  Resp: 19 20  20   Temp: (!) 97.3 F (36.3 C) (!) 97.5 F (36.4 C)    TempSrc:      SpO2: (!) 85% 91% (!) 88% (!) 81%  Weight:      Height:        Intake/Output Summary (Last 24 hours) at 06/25/2020 1335 Last data filed at 06/25/2020 0942 Gross per 24 hour  Intake 826 ml  Output 200 ml  Net 626 ml   Filed Weights   07/14/2020 1437  Weight: 64 kg    Examination: Constitutional: NAD Eyes: no scleral icterus ENMT: Mucous membranes are moist.  Neck: normal, supple Respiratory: clear to auscultation bilaterally, no wheezing, no crackles. Normal respiratory effort. Cardiovascular: Regular rate and rhythm, no murmurs / rubs / gallops. No LE edema.  Abdomen: non distended, no tenderness. Bowel sounds positive.  Musculoskeletal: no clubbing / cyanosis.  Skin: no rashes Neurologic: non focal    Data Reviewed: I have independently reviewed following labs and imaging studies   CBC: Recent Labs  Lab 06/26/2020 1538 06/24/20 0423 06/25/20 0435  WBC 7.8 4.7 5.7  NEUTROABS 7.1 4.3 5.2  HGB 14.4 13.6 12.9*  HCT 44.5 41.9 39.4  MCV 82.7 81.8 80.2  PLT 195 181 196   Basic Metabolic Panel: Recent Labs  Lab 06/27/2020 1538 06/24/20 0423 06/25/20 0435  NA 127* 132* 131*  K 4.5 4.4 3.7  CL 96* 100 101  CO2 19* 20* 20*  GLUCOSE 282* 190* 412*  BUN 25* 23* 39*  CREATININE 1.18 0.92 1.29*  CALCIUM 7.9* 7.8* 7.6*  MG  --  2.2 2.3   GFR: Estimated Creatinine Clearance: 48.2 mL/min (A) (by C-G formula based on SCr of 1.29 mg/dL (H)). Liver Function Tests: Recent Labs  Lab 06/30/2020 1538  06/24/20 0423 06/25/20 0435  AST 35 30 41  ALT 15 17 26   ALKPHOS 44 44 47  BILITOT 0.9 0.5 0.5  PROT 5.8* 5.7* 5.0*  ALBUMIN 2.7* 2.5* 2.2*   Recent Labs  Lab 07/12/2020 1538  LIPASE 19   No results for input(s): AMMONIA in the last 168 hours. Coagulation Profile: No results for input(s): INR, PROTIME in the last 168 hours. Cardiac Enzymes: No results for input(s): CKTOTAL, CKMB, CKMBINDEX, TROPONINI in the last 168 hours. BNP (last 3 results) No results for input(s): PROBNP in the last 8760 hours. HbA1C: Recent Labs    06/24/20 0423  HGBA1C 8.6*   CBG:  Recent Labs  Lab 06/24/20 1121 06/24/20 1654 06/24/20 2019 06/25/20 0807 06/25/20 1109  GLUCAP 250* 307* 413* 332* 220*   Lipid Profile: Recent Labs    07/16/2020 1538  TRIG 204*   Thyroid Function Tests: No results for input(s): TSH, T4TOTAL, FREET4, T3FREE, THYROIDAB in the last 72 hours. Anemia Panel: Recent Labs    06/24/20 0423 06/25/20 0435  FERRITIN 1,055* 1,643*   Urine analysis:    Component Value Date/Time   COLORURINE YELLOW 07/17/2020 1528   APPEARANCEUR CLEAR 07/01/2020 1528   LABSPEC 1.027 07/14/2020 1528   PHURINE 5.0 06/21/2020 1528   GLUCOSEU >=500 (A) 07/09/2020 1528   HGBUR NEGATIVE 07/13/2020 1528   BILIRUBINUR NEGATIVE 06/27/2020 1528   KETONESUR 20 (A) 07/14/2020 1528   PROTEINUR >=300 (A) 06/27/2020 1528   NITRITE NEGATIVE 06/26/2020 1528   LEUKOCYTESUR NEGATIVE 07/17/2020 1528   Sepsis Labs: Invalid input(s): PROCALCITONIN, LACTICIDVEN  Recent Results (from the past 240 hour(s))  Blood Culture (routine x 2)     Status: None (Preliminary result)   Collection Time: 07/07/2020  3:39 PM   Specimen: BLOOD  Result Value Ref Range Status   Specimen Description   Final    BLOOD RIGHT WRIST Performed at [facility] [address], [place], [place] [address]    Special Requests   Final    BOTTLES DRAWN AEROBIC AND ANAEROBIC Blood Culture results may not be  optimal due to an inadequate volume of blood received in culture bottles Performed at [facility] [address] [place], [place] [address]    Culture   Final    NO GROWTH 2 DAYS Performed at [facility] [address], [place], [place] [address]    Report Status PENDING  Incomplete  Blood Culture (routine x 2)     Status: None (Preliminary result)   Collection Time: 07/15/2020  3:39 PM   Specimen: BLOOD  Result Value Ref Range Status   Specimen Description   Final    BLOOD RIGHT ARM Performed at [facility] [address], [place], [place] [address]    Special Requests   Final    BOTTLES DRAWN AEROBIC AND ANAEROBIC Blood Culture adequate volume Performed at [facility] [address] [place], [place] [address]    Culture   Final    NO GROWTH 2 DAYS Performed at [facility] [address] [place],  [address]    Report Status PENDING  Incomplete      Radiology Studies: CT ABDOMEN PELVIS W CONTRAST  Result Date: 07/09/2020 CLINICAL DATA:  Abdominal pain and fever. EXAM: CT ABDOMEN AND PELVIS WITH CONTRAST TECHNIQUE: Multidetector CT imaging of the abdomen and pelvis was performed using the standard protocol following bolus administration of intravenous contrast. CONTRAST:  139mL OMNIPAQUE IOHEXOL 300 MG/ML  SOLN COMPARISON:  None. FINDINGS: Lower chest: Marked severity multifocal infiltrates are seen throughout the bilateral lung bases. Hepatobiliary: There is mild diffuse fatty infiltration of the liver parenchyma. No focal liver abnormality is seen. No gallstones, gallbladder wall thickening, or biliary dilatation. Pancreas: Unremarkable. No pancreatic ductal dilatation or surrounding inflammatory changes. Spleen: Normal in size without focal abnormality. Adrenals/Urinary Tract: Adrenal glands are unremarkable. The native kidneys are atrophic in appearance, without renal calculi, focal lesion, or  hydronephrosis. A normal appearing renal transplant is seen within the pelvis on the right. Bladder is unremarkable. Stomach/Bowel: Stomach is within normal limits. Appendix appears normal. No evidence of bowel  wall dilatation. Noninflamed diverticula are seen throughout the large bowel. Vascular/Lymphatic: Aortic atherosclerosis. No enlarged abdominal or pelvic lymph nodes. Reproductive: The prostate gland is mildly enlarged. Other: No abdominal wall hernia or abnormality. No abdominopelvic ascites. Musculoskeletal: A chronic compression fracture deformity is seen at the level of L1. IMPRESSION: 1. Marked severity multifocal bibasilar infiltrates. 2. Fatty liver. 3. Colonic diverticulosis. 4. Normal appearing renal transplant within the pelvis on the right. 5. Chronic compression fracture deformity of the L1 vertebral body. 6. Aortic atherosclerosis. Aortic Atherosclerosis (ICD10-I70.0). Electronically Signed   By: Virgina Norfolk M.D.   On: 07/09/2020 19:14   DG Chest Port 1 View  Result Date: 07/17/2020 CLINICAL DATA:  59 year old male with shortness of breath, positive COVID-19 times 10 days. EXAM: PORTABLE CHEST 1 VIEW COMPARISON:  [facility] Chest CTA 12/09/2016 and earlier. FINDINGS: Portable AP semi upright view at 1524 hours. Lung volumes and mediastinal contours appear stable since 2018 and within normal limits. There is hazy and indistinct opacity throughout much of the right lung, most confluent at the right lung base. Similar Patchy and confluent left lung base opacity. Visualized tracheal air column is within normal limits. No pneumothorax. No pleural effusion is evident. No acute osseous abnormality identified. IMPRESSION: Asymmetric right > left patchy and indistinct pulmonary opacity compatible with COVID-19 pneumonia in this setting. Electronically Signed   By: Genevie Ann M.D.   On: 06/30/2020 15:42    Marzetta Board, MD, PhD [facility]  Between 7 am - 7 pm I am available,  please contact me via Amion or Securechat  Between 7 pm - 7 am I am not available, please contact night coverage MD/APP via Amion

## 2020-06-26 LAB — GLUCOSE, CAPILLARY
Glucose-Capillary: 75 mg/dL (ref 70–99)
Glucose-Capillary: 86 mg/dL (ref 70–99)
Glucose-Capillary: 110 mg/dL — ABNORMAL HIGH (ref 70–99)
Glucose-Capillary: 172 mg/dL — ABNORMAL HIGH (ref 70–99)
Glucose-Capillary: 245 mg/dL — ABNORMAL HIGH (ref 70–99)

## 2020-06-26 LAB — CBC WITH DIFFERENTIAL/PLATELET
Abs Immature Granulocytes: 0.08 10*3/uL — ABNORMAL HIGH (ref 0.00–0.07)
Basophils Absolute: 0 10*3/uL (ref 0.0–0.1)
Basophils Relative: 0 %
Eosinophils Absolute: 0 10*3/uL (ref 0.0–0.5)
Eosinophils Relative: 0 %
HCT: 39.7 % (ref 39.0–52.0)
Hemoglobin: 12.8 g/dL — ABNORMAL LOW (ref 13.0–17.0)
Immature Granulocytes: 1 %
Lymphocytes Relative: 3 %
Lymphs Abs: 0.3 10*3/uL — ABNORMAL LOW (ref 0.7–4.0)
MCH: 26.2 pg (ref 26.0–34.0)
MCHC: 32.2 g/dL (ref 30.0–36.0)
MCV: 81.4 fL (ref 80.0–100.0)
Monocytes Absolute: 0.3 10*3/uL (ref 0.1–1.0)
Monocytes Relative: 3 %
Neutro Abs: 10.1 10*3/uL — ABNORMAL HIGH (ref 1.7–7.7)
Neutrophils Relative %: 93 %
Platelets: 208 10*3/uL (ref 150–400)
RBC: 4.88 MIL/uL (ref 4.22–5.81)
RDW: 15.2 % (ref 11.5–15.5)
WBC: 10.8 10*3/uL — ABNORMAL HIGH (ref 4.0–10.5)
nRBC: 0 % (ref 0.0–0.2)

## 2020-06-26 LAB — COMPREHENSIVE METABOLIC PANEL
ALT: 23 U/L (ref 0–44)
AST: 42 U/L — ABNORMAL HIGH (ref 15–41)
Albumin: 2.4 g/dL — ABNORMAL LOW (ref 3.5–5.0)
Alkaline Phosphatase: 49 U/L (ref 38–126)
Anion gap: 12 (ref 5–15)
BUN: 43 mg/dL — ABNORMAL HIGH (ref 6–20)
CO2: 19 mmol/L — ABNORMAL LOW (ref 22–32)
Calcium: 7.7 mg/dL — ABNORMAL LOW (ref 8.9–10.3)
Chloride: 105 mmol/L (ref 98–111)
Creatinine, Ser: 1.28 mg/dL — ABNORMAL HIGH (ref 0.61–1.24)
GFR, Estimated: >60 mL/min (ref >60)
Glucose, Bld: 116 mg/dL — ABNORMAL HIGH (ref 70–99)
Potassium: 3.9 mmol/L (ref 3.5–5.1)
Sodium: 136 mmol/L (ref 135–145)
Total Bilirubin: 0.3 mg/dL (ref 0.3–1.2)
Total Protein: 5.2 g/dL — ABNORMAL LOW (ref 6.5–8.1)

## 2020-06-26 LAB — C-REACTIVE PROTEIN: CRP: 6.2 mg/dL — ABNORMAL HIGH (ref <1.0)

## 2020-06-26 LAB — D-DIMER, QUANTITATIVE: D-Dimer, Quant: 1.94 ug/mL-FEU — ABNORMAL HIGH (ref 0.00–0.50)

## 2020-06-26 LAB — FERRITIN: Ferritin: 1187 ng/mL — ABNORMAL HIGH (ref 24–336)

## 2020-06-26 MED ORDER — INSULIN DETEMIR 100 UNIT/ML ~~LOC~~ SOLN
10.0000 [IU] | Freq: Two times a day (BID) | SUBCUTANEOUS | Status: DC
  Filled 2020-06-26 (×4): qty 0.1

## 2020-06-26 MED ORDER — BACLOFEN 10 MG PO TABS
5.0000 mg | ORAL_TABLET | Freq: Three times a day (TID) | ORAL | Status: DC | PRN
  Administered 2020-06-26 – 2020-06-27 (×3): 5 mg via ORAL
  Filled 2020-06-26 (×3): qty 1

## 2020-06-26 NOTE — Progress Notes (Signed)
Patient complained of having blurry vision and weakness.CBG rechecked, 50. Patient was given orange juice and reported relief from symptoms.  CBG rechecked at 2244 was 93.

## 2020-06-26 NOTE — Progress Notes (Signed)
PROGRESS NOTE  Craig Fisher VWU:[id number] DOB: [birth year] DOA: 06/20/2020 PCP: Sharion Balloon, FNP   LOS: 3 days   Brief Narrative / Interim history: 59 year old male with ESRD due to uncontrolled diabetes mellitus status post renal transplantation in 2019, CKD 3A, IDDM, HTN, diagnosed with COVID-19 on 12/3 came into the hospital with shortness of breath and hypoxia.  He has been having abdominal discomfort, nausea, vomiting and diarrhea for the past week.  Has felt weak, worsening fatigue and malaised.  He was supposed to get monoclonal antibodies on 12/6 but he was hypoxic and was admitted to the hospital.  His renal transplant team has been contacted and advised holding the Myfortic but continuing Prograf.  Subjective / 24h Interval events: Still with intermittent abdominal pain.  Breathing is about the same.  Assessment & Plan:  Principal Problem Acute Hypoxic Respiratory Failure due to Covid-19 Viral Illness -Patient is vaccinated however his immunosuppressed given renal transplant and immunosuppressives at home. -Remains quite hypoxic, has increased oxygen requirements on 12 L -Continue Remdesivir, steroids, not a candidate for Actemra given chronic immunosuppression -He was also started on antibiotics for bacterial pneumonia, continue for 5 total days with about 2 days remaining   COVID-19 Labs  Recent Labs    07/11/2020 1538 06/24/2020 1703 06/24/20 0423 06/25/20 0435 06/26/20 0401  DDIMER 0.77*  --  0.63* 1.35* 1.94*  FERRITIN  --    < > 1,055* 1,643* 1,187*  LDH 522*  --   --   --   --   CRP  --    < > 19.2* 12.2* 6.2*   < > = values in this interval not displayed.    Active Problems Renal transplant recipient, history of ESRD -Creatinine 1.25 this morning, it was 1.18 on admission.  This appears close to his baseline.  Encourage p.o. intake -Continue tacrolimus, hold mycophenolate  Hypertension -Continue Norvasc, Coreg, lisinopril, clonidine, hydralazine.  Blood  pressure is stable  Abdominal pain, nausea, vomiting -Abdominal pain seems to be better.  CT scan on admission unremarkable for acute findings.  This is probably due to Covid  Hyperlipidemia -Continue statin  Hyponatremia -Sodium normalized with fluids  Insulin-dependent diabetes mellitus with hyper and hypoglycemia -Poorly controlled, now with steroid-induced hyperglycemia, A1c 8.9.  -Yesterday CBGs were in the 400s and Lantus was increased however became hypoglycemic, will decreased insulins today  CBG (last 3)  Recent Labs    06/25/20 2322 06/26/20 0154 06/26/20 0748  GLUCAP 102* 75 86    Scheduled Meds: . amLODipine  10 mg Oral Daily  . atorvastatin  20 mg Oral Daily  . carvedilol  12.5 mg Oral BID WC  . cloNIDine  0.1 mg Oral BID  . enoxaparin (LOVENOX) injection  40 mg Subcutaneous Q24H  . hydrALAZINE  75 mg Oral TID  . insulin aspart  0-15 Units Subcutaneous TID WC  . insulin aspart  0-5 Units Subcutaneous QHS  . insulin aspart  3 Units Subcutaneous TID WC  . insulin detemir  10 Units Subcutaneous BID  . linagliptin  5 mg Oral Daily  . lisinopril  2.5 mg Oral Daily  . mouth rinse  15 mL Mouth Rinse BID  . methylPREDNISolone (SOLU-MEDROL) injection  60 mg Intravenous Q12H  . pantoprazole  20 mg Oral BID  . sodium chloride flush  3 mL Intravenous Q12H  . tacrolimus  1 mg Oral Daily   And  . tacrolimus  0.5 mg Oral QHS  . tamsulosin  0.4 mg Oral Daily  Continuous Infusions: . azithromycin 500 mg (06/25/20 1953)  . cefTRIAXone (ROCEPHIN)  IV 2 g (06/25/20 1837)  . remdesivir 100 mg in NS 100 mL 100 mg (06/26/20 1000)   PRN Meds:.acetaminophen, ondansetron **OR** ondansetron (ZOFRAN) IV, oxyCODONE, senna  DVT prophylaxis: Lovenox Code Status: Full code Family Communication: Discussed with patient in detail, discussed with daughter over the phone   Status is: Inpatient  Remains inpatient appropriate because:Inpatient level of care appropriate due to  severity of illness   Dispo: The patient is from: Home              Anticipated d/c is to: Home              Anticipated d/c date is: > 3 days              Patient currently is not medically stable to d/c.  Consultants:  None   Procedures:  None   Microbiology: None   Antibacterials: Ceftriaxone / Azithromycin    Objective: Vitals:   06/25/20 1818 06/25/20 1835 06/25/20 2115 06/26/20 0647  BP: (!) 110/50  (!) 102/50 (!) 155/64  Pulse: (!) 58  62 68  Resp: 20  19 (!) 22  Temp:   98.1 F (36.7 C) 97.8 F (36.6 C)  TempSrc:   Oral Oral  SpO2: (!) 83% (!) 85% (!) 89% 94%  Weight:      Height:        Intake/Output Summary (Last 24 hours) at 06/26/2020 1050 Last data filed at 06/26/2020 0947 Gross per 24 hour  Intake 640.49 ml  Output --  Net 640.49 ml   Filed Weights   07/03/2020 1437  Weight: 64 kg    Examination: Constitutional: No distress, in bed Eyes: No scleral icterus ENMT: Moist membranes Neck: normal, supple Respiratory: Bibasilar rhonchi, no wheezing, no crackles, normal respiratory effort Cardiovascular: Regular rate and rhythm, no murmurs, no peripheral edema Abdomen: Soft, nontender, nondistended, bowel sounds positive Musculoskeletal: no clubbing / cyanosis.  Skin: no rashes appreciated Neurologic: No focal deficits, equal strength   Data Reviewed: I have independently reviewed following labs and imaging studies   CBC: Recent Labs  Lab 07/01/2020 1538 06/24/20 0423 06/25/20 0435 06/26/20 0401  WBC 7.8 4.7 5.7 10.8*  NEUTROABS 7.1 4.3 5.2 10.1*  HGB 14.4 13.6 12.9* 12.8*  HCT 44.5 41.9 39.4 39.7  MCV 82.7 81.8 80.2 81.4  PLT 195 181 180 408   Basic Metabolic Panel: Recent Labs  Lab 06/28/2020 1538 06/24/20 0423 06/25/20 0435 06/26/20 0401  NA 127* 132* 131* 136  K 4.5 4.4 3.7 3.9  CL 96* 100 101 105  CO2 19* 20* 20* 19*  GLUCOSE 282* 190* 412* 116*  BUN 25* 23* 39* 43*  CREATININE 1.18 0.92 1.29* 1.28*  CALCIUM 7.9* 7.8* 7.6*  7.7*  MG  --  2.2 2.3  --    GFR: Estimated Creatinine Clearance: 48 mL/min (A) (by C-G formula based on SCr of 1.28 mg/dL (H)). Liver Function Tests: Recent Labs  Lab 07/13/2020 1538 06/24/20 0423 06/25/20 0435 06/26/20 0401  AST 35 30 41 42*  ALT 15 17 26 23   ALKPHOS 44 44 47 49  BILITOT 0.9 0.5 0.5 0.3  PROT 5.8* 5.7* 5.0* 5.2*  ALBUMIN 2.7* 2.5* 2.2* 2.4*   Recent Labs  Lab 06/24/2020 1538  LIPASE 19   No results for input(s): AMMONIA in the last 168 hours. Coagulation Profile: No results for input(s): INR, PROTIME in the last 168 hours. Cardiac Enzymes:  No results for input(s): CKTOTAL, CKMB, CKMBINDEX, TROPONINI in the last 168 hours. BNP (last 3 results) No results for input(s): PROBNP in the last 8760 hours. HbA1C: Recent Labs    06/24/20 0423  HGBA1C 8.6*   CBG: Recent Labs  Lab 06/25/20 2211 06/25/20 2244 06/25/20 2322 06/26/20 0154 06/26/20 0748  GLUCAP 50* 93 102* 75 86   Lipid Profile: Recent Labs    06/22/2020 1538  TRIG 204*   Thyroid Function Tests: No results for input(s): TSH, T4TOTAL, FREET4, T3FREE, THYROIDAB in the last 72 hours. Anemia Panel: Recent Labs    06/25/20 0435 06/26/20 0401  FERRITIN 1,643* 1,187*   Urine analysis:    Component Value Date/Time   COLORURINE YELLOW 07/04/2020 1528   APPEARANCEUR CLEAR 07/06/2020 1528   LABSPEC 1.027 07/12/2020 1528   PHURINE 5.0 06/24/2020 1528   GLUCOSEU >=500 (A) 06/18/2020 1528   HGBUR NEGATIVE 06/22/2020 1528   BILIRUBINUR NEGATIVE 07/16/2020 1528   KETONESUR 20 (A) 07/08/2020 1528   PROTEINUR >=300 (A) 07/01/2020 1528   NITRITE NEGATIVE 07/09/2020 1528   LEUKOCYTESUR NEGATIVE 07/08/2020 1528   Sepsis Labs: Invalid input(s): PROCALCITONIN, LACTICIDVEN  Recent Results (from the past 240 hour(s))  Blood Culture (routine x 2)     Status: None (Preliminary result)   Collection Time: 07/17/2020  3:39 PM   Specimen: BLOOD  Result Value Ref Range Status   Specimen Description    Final    BLOOD RIGHT WRIST Performed at [facility] [address], [place], [place] [address]    Special Requests   Final    BOTTLES DRAWN AEROBIC AND ANAEROBIC Blood Culture results may not be optimal due to an inadequate volume of blood received in culture bottles Performed at [facility] [address], [place], [place] [address]    Culture   Final    NO GROWTH 3 DAYS Performed at [facility], [place] [address] [place], [place] [address]    Report Status PENDING  Incomplete  Blood Culture (routine x 2)     Status: None (Preliminary result)   Collection Time: 06/29/2020  3:39 PM   Specimen: BLOOD  Result Value Ref Range Status   Specimen Description   Final    BLOOD RIGHT ARM Performed at [facility] [address], [place], [place] [address]    Special Requests   Final    BOTTLES DRAWN AEROBIC AND ANAEROBIC Blood Culture adequate volume Performed at [facility] [address] [place], [place] [address]    Culture   Final    NO GROWTH 3 DAYS Performed at [facility], [place] [address] [place], [place] [address]    Report Status PENDING  Incomplete      Radiology Studies: No results found.  Marzetta Board, MD, PhD [facility]  Between 7 am - 7 pm I am available, please contact me via Amion or Securechat  Between 7 pm - 7 am I am not available, please contact night coverage MD/APP via Amion

## 2020-06-27 LAB — COMPREHENSIVE METABOLIC PANEL
ALT: 25 U/L (ref 0–44)
AST: 40 U/L (ref 15–41)
Albumin: 2.3 g/dL — ABNORMAL LOW (ref 3.5–5.0)
Alkaline Phosphatase: 68 U/L (ref 38–126)
Anion gap: 10 (ref 5–15)
BUN: 33 mg/dL — ABNORMAL HIGH (ref 6–20)
CO2: 20 mmol/L — ABNORMAL LOW (ref 22–32)
Calcium: 7.4 mg/dL — ABNORMAL LOW (ref 8.9–10.3)
Chloride: 105 mmol/L (ref 98–111)
Creatinine, Ser: 1.12 mg/dL (ref 0.61–1.24)
GFR, Estimated: >60 mL/min (ref >60)
Glucose, Bld: 317 mg/dL — ABNORMAL HIGH (ref 70–99)
Potassium: 4.9 mmol/L (ref 3.5–5.1)
Sodium: 135 mmol/L (ref 135–145)
Total Bilirubin: 0.7 mg/dL (ref 0.3–1.2)
Total Protein: 5.1 g/dL — ABNORMAL LOW (ref 6.5–8.1)

## 2020-06-27 LAB — CBC WITH DIFFERENTIAL/PLATELET
Abs Immature Granulocytes: 0.14 10*3/uL — ABNORMAL HIGH (ref 0.00–0.07)
Basophils Absolute: 0 10*3/uL (ref 0.0–0.1)
Basophils Relative: 0 %
Eosinophils Absolute: 0 10*3/uL (ref 0.0–0.5)
Eosinophils Relative: 0 %
HCT: 38.7 % — ABNORMAL LOW (ref 39.0–52.0)
Hemoglobin: 12.7 g/dL — ABNORMAL LOW (ref 13.0–17.0)
Immature Granulocytes: 2 %
Lymphocytes Relative: 2 %
Lymphs Abs: 0.2 10*3/uL — ABNORMAL LOW (ref 0.7–4.0)
MCH: 26.6 pg (ref 26.0–34.0)
MCHC: 32.8 g/dL (ref 30.0–36.0)
MCV: 81.1 fL (ref 80.0–100.0)
Monocytes Absolute: 0.3 10*3/uL (ref 0.1–1.0)
Monocytes Relative: 3 %
Neutro Abs: 8.7 10*3/uL — ABNORMAL HIGH (ref 1.7–7.7)
Neutrophils Relative %: 93 %
Platelets: 184 10*3/uL (ref 150–400)
RBC: 4.77 MIL/uL (ref 4.22–5.81)
RDW: 15.5 % (ref 11.5–15.5)
WBC: 9.3 10*3/uL (ref 4.0–10.5)
nRBC: 0.2 % (ref 0.0–0.2)

## 2020-06-27 LAB — GLUCOSE, CAPILLARY
Glucose-Capillary: 317 mg/dL — ABNORMAL HIGH (ref 70–99)
Glucose-Capillary: 427 mg/dL — ABNORMAL HIGH (ref 70–99)
Glucose-Capillary: 497 mg/dL — ABNORMAL HIGH (ref 70–99)
Glucose-Capillary: 451 mg/dL — ABNORMAL HIGH (ref 70–99)
Glucose-Capillary: 428 mg/dL — ABNORMAL HIGH (ref 70–99)

## 2020-06-27 LAB — D-DIMER, QUANTITATIVE: D-Dimer, Quant: 2.81 ug/mL-FEU — ABNORMAL HIGH (ref 0.00–0.50)

## 2020-06-27 LAB — C-REACTIVE PROTEIN: CRP: 4 mg/dL — ABNORMAL HIGH (ref <1.0)

## 2020-06-27 LAB — FERRITIN: Ferritin: 1019 ng/mL — ABNORMAL HIGH (ref 24–336)

## 2020-06-27 LAB — GLUCOSE, RANDOM: Glucose, Bld: 488 mg/dL — ABNORMAL HIGH (ref 70–99)

## 2020-06-27 MED ORDER — INSULIN GLARGINE 100 UNIT/ML ~~LOC~~ SOLN
13.0000 [IU] | Freq: Every day | SUBCUTANEOUS | Status: DC
  Administered 2020-06-27: 13 [IU] via SUBCUTANEOUS
  Filled 2020-06-27 (×2): qty 0.13

## 2020-06-27 MED ORDER — INSULIN GLARGINE 100 UNIT/ML ~~LOC~~ SOLN
13.0000 [IU] | Freq: Every day | SUBCUTANEOUS | Status: DC
  Filled 2020-06-27: qty 0.13

## 2020-06-27 MED ORDER — CHLORPROMAZINE HCL 25 MG/ML IJ SOLN
25.0000 mg | Freq: Once | INTRAMUSCULAR | Status: AC
  Administered 2020-06-27: 25 mg via INTRAMUSCULAR
  Filled 2020-06-27: qty 1

## 2020-06-27 MED ORDER — INSULIN DEGLUDEC 100 UNIT/ML ~~LOC~~ SOPN: 13.0000 [IU] | PEN_INJECTOR | Freq: Every day | SUBCUTANEOUS | Status: DC

## 2020-06-27 NOTE — Progress Notes (Signed)
Inpatient Diabetes Program Recommendations  AACE/ADA: New Consensus Statement on Inpatient Glycemic Control (2015)  Target Ranges:  Prepandial:   less than 140 mg/dL      Peak postprandial:   less than 180 mg/dL (1-2 hours)      Critically ill patients:  140 - 180 mg/dL   Lab Results  Component Value Date   GLUCAP 427 (H) 06/27/2020   HGBA1C 8.6 (H) 06/24/2020    Review of Glycemic Control  Diabetes history: DM2 Outpatient Diabetes medications: Tresiba 13 units QD, Humalog 0-20 tidwc Current orders for Inpatient glycemic control: Levemir 10 units bid, Novolog 0-15 units tidwc and hs + 3 units tidwc, tradjenta 5 mg QD  On Solumedrol 60 mg Q12H.  Inpatient Diabetes Program Recommendations:    Increase Novolog to 6 units tidwc for meal coverage insulin.  Continue to follow for glucose trends.   Thank you. Lorenda Peck, RD, LDN, CDE Inpatient Diabetes Coordinator [phone]

## 2020-06-27 NOTE — Progress Notes (Signed)
PROGRESS NOTE  Craig Fisher JKD:[id number] DOB: 1961/05/27 DOA: 06/19/2020 PCP: Sharion Balloon, FNP   LOS: 4 days   Brief Narrative / Interim history: 59 year old male with ESRD due to uncontrolled diabetes mellitus status post renal transplantation in 2019, CKD 3A, IDDM, HTN, diagnosed with COVID-19 on 12/3 came into the hospital with shortness of breath and hypoxia.  He has been having abdominal discomfort, nausea, vomiting and diarrhea for the past week.  Has felt weak, worsening fatigue and malaised.  He was supposed to get monoclonal antibodies on 12/6 but he was hypoxic and was admitted to the hospital.  His renal transplant team has been contacted and advised holding the Myfortic but continuing Prograf.  Subjective / 24h Interval events: He tells me that he is feeling better, breathing better and less abdominal pain. Has been having intermittent hiccups  Assessment & Plan:  Principal Problem Acute Hypoxic Respiratory Failure due to Covid-19 Viral Illness -Patient is vaccinated however his immunosuppressed given renal transplant and immunosuppressives at home. -Remains hypoxic, currently on 15 L -Continue Remdesivir, steroids, not a candidate for Actemra given chronic immunosuppression -He was also started on antibiotics for bacterial pneumonia, continue for 5 total days with 1 day remaining   COVID-19 Labs  Recent Labs    06/25/20 0435 06/26/20 0401 06/27/20 0430  DDIMER 1.35* 1.94* 2.81*  FERRITIN 1,643* 1,187* 1,019*  CRP 12.2* 6.2* 4.0*    Active Problems Renal transplant recipient, history of ESRD -Creatinine stable, at baseline, 1.12 this morning -Continue tacrolimus, hold mycophenolate  Hypertension -Continue Norvasc, Coreg, lisinopril, clonidine, hydralazine.  Blood pressure is stable  Abdominal pain, nausea, vomiting -Abdominal pain seems to be better.  CT scan on admission unremarkable for acute findings.  This is probably due to  Covid  Hyperlipidemia -Continue statin  Hyponatremia -Sodium normalized with fluids  Insulin-dependent diabetes mellitus with hyper and hypoglycemia -Poorly controlled, now with steroid-induced hyperglycemia, A1c 8.9.  -Yesterday CBGs were in the 400s and Lantus was increased however became hypoglycemic. Per RN he has refused his Levemir this morning, he is significantly hyperglycemic, closely monitor  CBG (last 3)  Recent Labs    06/26/20 2035 06/27/20 0744 06/27/20 1143  GLUCAP 245* 317* 427*    Scheduled Meds: . amLODipine  10 mg Oral Daily  . atorvastatin  20 mg Oral Daily  . carvedilol  12.5 mg Oral BID WC  . cloNIDine  0.1 mg Oral BID  . enoxaparin (LOVENOX) injection  40 mg Subcutaneous Q24H  . hydrALAZINE  75 mg Oral TID  . insulin aspart  0-15 Units Subcutaneous TID WC  . insulin aspart  0-5 Units Subcutaneous QHS  . insulin aspart  3 Units Subcutaneous TID WC  . insulin detemir  10 Units Subcutaneous BID  . linagliptin  5 mg Oral Daily  . lisinopril  2.5 mg Oral Daily  . mouth rinse  15 mL Mouth Rinse BID  . methylPREDNISolone (SOLU-MEDROL) injection  60 mg Intravenous Q12H  . pantoprazole  20 mg Oral BID  . sodium chloride flush  3 mL Intravenous Q12H  . tacrolimus  1 mg Oral Daily   And  . tacrolimus  0.5 mg Oral QHS  . tamsulosin  0.4 mg Oral Daily   Continuous Infusions: . azithromycin Stopped (06/26/20 2054)  . cefTRIAXone (ROCEPHIN)  IV Stopped (06/26/20 1945)   PRN Meds:.acetaminophen, baclofen, ondansetron **OR** ondansetron (ZOFRAN) IV, oxyCODONE, senna  DVT prophylaxis: Lovenox Code Status: Full code Family Communication: Discussed with patient in detail, discussed with  daughter over the phone   Status is: Inpatient  Remains inpatient appropriate because:Inpatient level of care appropriate due to severity of illness   Dispo: The patient is from: Home              Anticipated d/c is to: Home              Anticipated d/c date is: > 3  days              Patient currently is not medically stable to d/c.  Consultants:  None   Procedures:  None   Microbiology: None   Antibacterials: Ceftriaxone / Azithromycin    Objective: Vitals:   06/26/20 0647 06/26/20 1333 06/26/20 2034 06/27/20 0552  BP: (!) 155/64 (!) 149/77 130/71 (!) 154/72  Pulse: 68 69 70 72  Resp: (!) 22 (!) 22 20 17   Temp: 97.8 F (36.6 C) 98 F (36.7 C) 98.5 F (36.9 C) 97.7 F (36.5 C)  TempSrc: Oral Oral Oral   SpO2: 94% 91% 91% 91%  Weight:      Height:        Intake/Output Summary (Last 24 hours) at 06/27/2020 1222 Last data filed at 06/26/2020 2054 Gross per 24 hour  Intake 471.58 ml  Output --  Net 471.58 ml   Filed Weights   07/17/2020 1437  Weight: 64 kg    Examination: Constitutional: NAD, in bed Eyes: No icterus ENMT: mmm Neck: normal, supple Respiratory: Bibasilar rhonchi, no wheezing, no crackles, normal respiratory effort Cardiovascular: Regular rate and rhythm, no murmurs, no edema Abdomen: Soft, NT, ND, bowel sounds positive Musculoskeletal: no clubbing / cyanosis.  Skin: No rashes seen Neurologic: Nonfocal   Data Reviewed: I have independently reviewed following labs and imaging studies   CBC: Recent Labs  Lab 07/03/2020 1538 06/24/20 0423 06/25/20 0435 06/26/20 0401 06/27/20 0430  WBC 7.8 4.7 5.7 10.8* 9.3  NEUTROABS 7.1 4.3 5.2 10.1* 8.7*  HGB 14.4 13.6 12.9* 12.8* 12.7*  HCT 44.5 41.9 39.4 39.7 38.7*  MCV 82.7 81.8 80.2 81.4 81.1  PLT 195 181 180 208 462   Basic Metabolic Panel: Recent Labs  Lab 06/18/2020 1538 06/24/20 0423 06/25/20 0435 06/26/20 0401 06/27/20 0430  NA 127* 132* 131* 136 135  K 4.5 4.4 3.7 3.9 4.9  CL 96* 100 101 105 105  CO2 19* 20* 20* 19* 20*  GLUCOSE 282* 190* 412* 116* 317*  BUN 25* 23* 39* 43* 33*  CREATININE 1.18 0.92 1.29* 1.28* 1.12  CALCIUM 7.9* 7.8* 7.6* 7.7* 7.4*  MG  --  2.2 2.3  --   --    GFR: Estimated Creatinine Clearance: 54.8 mL/min (by C-G formula  based on SCr of 1.12 mg/dL). Liver Function Tests: Recent Labs  Lab 07/05/2020 1538 06/24/20 0423 06/25/20 0435 06/26/20 0401 06/27/20 0430  AST 35 30 41 42* 40  ALT 15 17 26 23 25   ALKPHOS 44 44 47 49 68  BILITOT 0.9 0.5 0.5 0.3 0.7  PROT 5.8* 5.7* 5.0* 5.2* 5.1*  ALBUMIN 2.7* 2.5* 2.2* 2.4* 2.3*   Recent Labs  Lab 06/26/2020 1538  LIPASE 19   No results for input(s): AMMONIA in the last 168 hours. Coagulation Profile: No results for input(s): INR, PROTIME in the last 168 hours. Cardiac Enzymes: No results for input(s): CKTOTAL, CKMB, CKMBINDEX, TROPONINI in the last 168 hours. BNP (last 3 results) No results for input(s): PROBNP in the last 8760 hours. HbA1C: No results for input(s): HGBA1C in the last 72  hours. CBG: Recent Labs  Lab 06/26/20 1135 06/26/20 1638 06/26/20 2035 06/27/20 0744 06/27/20 1143  GLUCAP 110* 172* 245* 317* 427*   Lipid Profile: No results for input(s): CHOL, HDL, LDLCALC, TRIG, CHOLHDL, LDLDIRECT in the last 72 hours. Thyroid Function Tests: No results for input(s): TSH, T4TOTAL, FREET4, T3FREE, THYROIDAB in the last 72 hours. Anemia Panel: Recent Labs    06/26/20 0401 06/27/20 0430  FERRITIN 1,187* 1,019*   Urine analysis:    Component Value Date/Time   COLORURINE YELLOW 07/11/2020 1528   APPEARANCEUR CLEAR 07/08/2020 1528   LABSPEC 1.027 07/11/2020 1528   PHURINE 5.0 07/15/2020 1528   GLUCOSEU >=500 (A) 06/26/2020 1528   HGBUR NEGATIVE 06/30/2020 1528   BILIRUBINUR NEGATIVE 07/13/2020 1528   KETONESUR 20 (A) 06/18/2020 1528   PROTEINUR >=300 (A) 06/29/2020 1528   NITRITE NEGATIVE 06/22/2020 1528   LEUKOCYTESUR NEGATIVE 06/18/2020 1528   Sepsis Labs: Invalid input(s): PROCALCITONIN, LACTICIDVEN  Recent Results (from the past 240 hour(s))  Blood Culture (routine x 2)     Status: None (Preliminary result)   Collection Time: 06/24/2020  3:39 PM   Specimen: BLOOD  Result Value Ref Range Status   Specimen Description   Final     BLOOD RIGHT WRIST Performed at [facility] [address], [place], [place] [address]    Special Requests   Final    BOTTLES DRAWN AEROBIC AND ANAEROBIC Blood Culture results may not be optimal due to an inadequate volume of blood received in culture bottles Performed at [facility] [address] [place], [place] [address]    Culture   Final    NO GROWTH 4 DAYS Performed at [facility], [place] [address], [place], [place] [address]    Report Status PENDING  Incomplete  Blood Culture (routine x 2)     Status: None (Preliminary result)   Collection Time: 06/29/2020  3:39 PM   Specimen: BLOOD  Result Value Ref Range Status   Specimen Description   Final    BLOOD RIGHT ARM Performed at [facility] [address], [place], [place] [address]    Special Requests   Final    BOTTLES DRAWN AEROBIC AND ANAEROBIC Blood Culture adequate volume Performed at [facility] [address], [place], [place] [address]    Culture   Final    NO GROWTH 4 DAYS Performed at [facility], [place] [address] [place], [place] [address]    Report Status PENDING  Incomplete      Radiology Studies: No results found.  Marzetta Board, MD, PhD [facility]  Between 7 am - 7 pm I am available, please contact me via Amion or Securechat  Between 7 pm - 7 am I am not available, please contact night coverage MD/APP via Amion

## 2020-06-27 NOTE — Progress Notes (Signed)
Patient's blood sugars elevated at 0800 and at 12Noon today.  Patient has refused insulin although this writer has given him education and the importance of taking insulin which could lead to higher blood sugars and multi organ failures.  Patient states he does not want his blood sugar to drop low because this has happened to him before and he does not want to "die." These concerns of the patient have been shared with Dr. Cruzita Lederer as well as patient's daughter(Guadalupe).  This Probation officer has asked patient's daughter if she could encourage pt to take insulin and explain importance of insulin administration to him.  She verbalized understanding and stated she would.   Patient is also ambulating to Bathroom with 1+ assist this am.  Oxygen on 15L high flow with NRB.  Patient becomes SOB and desats on exertion. Sats falls to 78%.  Patient has been encouraged to use the urinal until oxygen sats improve.  Will continue to monitor.   Pt states he has had a lot of hiccups throughout the course of the day and is requesting some medication to decrease hiccups.  Will ask provider about such medication; otherwise, will continue to monitor.

## 2020-06-28 ENCOUNTER — Encounter (HOSPITAL_COMMUNITY): Payer: Medicare Other

## 2020-06-28 LAB — CBC WITH DIFFERENTIAL/PLATELET
Abs Immature Granulocytes: 0.17 10*3/uL — ABNORMAL HIGH (ref 0.00–0.07)
Basophils Absolute: 0 10*3/uL (ref 0.0–0.1)
Basophils Relative: 0 %
Eosinophils Absolute: 0 10*3/uL (ref 0.0–0.5)
Eosinophils Relative: 0 %
HCT: 39.6 % (ref 39.0–52.0)
Hemoglobin: 12.4 g/dL — ABNORMAL LOW (ref 13.0–17.0)
Immature Granulocytes: 2 %
Lymphocytes Relative: 1 %
Lymphs Abs: 0.1 10*3/uL — ABNORMAL LOW (ref 0.7–4.0)
MCH: 26.3 pg (ref 26.0–34.0)
MCHC: 31.3 g/dL (ref 30.0–36.0)
MCV: 84.1 fL (ref 80.0–100.0)
Monocytes Absolute: 0.4 10*3/uL (ref 0.1–1.0)
Monocytes Relative: 3 %
Neutro Abs: 9.6 10*3/uL — ABNORMAL HIGH (ref 1.7–7.7)
Neutrophils Relative %: 94 %
Platelets: 170 10*3/uL (ref 150–400)
RBC: 4.71 MIL/uL (ref 4.22–5.81)
RDW: 15.6 % — ABNORMAL HIGH (ref 11.5–15.5)
WBC: 10.3 10*3/uL (ref 4.0–10.5)
nRBC: 0.2 % (ref 0.0–0.2)

## 2020-06-28 LAB — FERRITIN: Ferritin: 838 ng/mL — ABNORMAL HIGH (ref 24–336)

## 2020-06-28 LAB — COMPREHENSIVE METABOLIC PANEL
ALT: 32 U/L (ref 0–44)
AST: 43 U/L — ABNORMAL HIGH (ref 15–41)
Albumin: 2.3 g/dL — ABNORMAL LOW (ref 3.5–5.0)
Alkaline Phosphatase: 86 U/L (ref 38–126)
Anion gap: 10 (ref 5–15)
BUN: 38 mg/dL — ABNORMAL HIGH (ref 6–20)
CO2: 19 mmol/L — ABNORMAL LOW (ref 22–32)
Calcium: 7.4 mg/dL — ABNORMAL LOW (ref 8.9–10.3)
Chloride: 104 mmol/L (ref 98–111)
Creatinine, Ser: 1.15 mg/dL (ref 0.61–1.24)
GFR, Estimated: >60 mL/min (ref >60)
Glucose, Bld: 345 mg/dL — ABNORMAL HIGH (ref 70–99)
Potassium: 4.7 mmol/L (ref 3.5–5.1)
Sodium: 133 mmol/L — ABNORMAL LOW (ref 135–145)
Total Bilirubin: 0.8 mg/dL (ref 0.3–1.2)
Total Protein: 4.9 g/dL — ABNORMAL LOW (ref 6.5–8.1)

## 2020-06-28 LAB — CULTURE, BLOOD (ROUTINE X 2)
Culture: NO GROWTH
Culture: NO GROWTH
Special Requests: ADEQUATE

## 2020-06-28 LAB — GLUCOSE, CAPILLARY
Glucose-Capillary: 209 mg/dL — ABNORMAL HIGH (ref 70–99)
Glucose-Capillary: 262 mg/dL — ABNORMAL HIGH (ref 70–99)
Glucose-Capillary: 306 mg/dL — ABNORMAL HIGH (ref 70–99)
Glucose-Capillary: 317 mg/dL — ABNORMAL HIGH (ref 70–99)
Glucose-Capillary: 389 mg/dL — ABNORMAL HIGH (ref 70–99)
Glucose-Capillary: 82 mg/dL (ref 70–99)

## 2020-06-28 LAB — C-REACTIVE PROTEIN: CRP: 3 mg/dL — ABNORMAL HIGH (ref <1.0)

## 2020-06-28 LAB — D-DIMER, QUANTITATIVE: D-Dimer, Quant: 3 ug/mL-FEU — ABNORMAL HIGH (ref 0.00–0.50)

## 2020-06-28 NOTE — Plan of Care (Signed)
POC discussed with pt, pt alert and oriented x4, shows no signs of distress, VSS. Pt currently on 15L HFNC. No complaints of pain. Bed wheels locked, nonskid footwear applied OOB, call bell within reach, encouraged to use call bell for assistance, no falls during sift  Problem: Education: Goal: Knowledge of risk factors and measures for prevention of condition will improve Outcome: Progressing   Problem: Coping: Goal: Psychosocial and spiritual needs will be supported Outcome: Progressing   Problem: Respiratory: Goal: Will maintain a patent airway Outcome: Progressing Goal: Complications related to the disease process, condition or treatment will be avoided or minimized Outcome: Progressing   Problem: Activity: Goal: Ability to tolerate increased activity will improve Outcome: Progressing   Problem: Respiratory: Goal: Ability to maintain a clear airway will improve Outcome: Progressing   Problem: Pain Managment: Goal: General experience of comfort will improve Outcome: Progressing   Problem: Skin Integrity: Goal: Risk for impaired skin integrity will decrease Outcome: Progressing   Problem: Education: Goal: Knowledge of General Education information will improve Description: Including pain rating scale, medication(s)/side effects and non-pharmacologic comfort measures Outcome: Progressing   Problem: Health Behavior/Discharge Planning: Goal: Ability to manage health-related needs will improve Outcome: Progressing   Problem: Clinical Measurements: Goal: Ability to maintain clinical measurements within normal limits will improve Outcome: Progressing Goal: Will remain free from infection Outcome: Progressing Goal: Diagnostic test results will improve Outcome: Progressing Goal: Respiratory complications will improve Outcome: Progressing Goal: Cardiovascular complication will be avoided Outcome: Progressing   Problem: Activity: Goal: Risk for activity intolerance will  decrease Outcome: Progressing   Problem: Nutrition: Goal: Adequate nutrition will be maintained Outcome: Progressing   Problem: Coping: Goal: Level of anxiety will decrease Outcome: Progressing   Problem: Elimination: Goal: Will not experience complications related to bowel motility Outcome: Progressing Goal: Will not experience complications related to urinary retention Outcome: Progressing   Problem: Pain Managment: Goal: General experience of comfort will improve Outcome: Progressing   Problem: Safety: Goal: Ability to remain free from injury will improve Outcome: Progressing   Problem: Skin Integrity: Goal: Risk for impaired skin integrity will decrease Outcome: Progressing

## 2020-06-28 NOTE — Progress Notes (Addendum)
Rapid Response Event Note   Reason for Call :   Symptomatic, chronically high BS & pt refusing SQ insulin  Initial Focused Assessment:  BS 428, anion gap 10, K+ 4.9, CO2 20. Currently lying in bed w/ HFNC & facemask (sat 87%). Normal WOB, using cell phone. Pt c/o extreme thirst.   On arrival, RN convinced pt to receive HS dose of insulin (5U)  Plan of Care:  RN to give Lantus (13U) & use translator to educate pt on symptoms of high BS & benefits of SQ insulin while sick.  NT to recheck BS @ 0000 to make sure BS trending down.  RR-RN will continue to monitor     ---Rechecked BS now 389 @ 0001  MD Notified: [facility] on call Call Time: 2140p Arrival Time: 2150p End Time: [place], RN

## 2020-06-28 NOTE — Progress Notes (Signed)
PROGRESS NOTE  Craig Fisher MWU:[id number] DOB: 1961-06-14 DOA: 06/20/2020 PCP: Sharion Balloon, FNP   LOS: 5 days   Brief Narrative / Interim history: 59 year old male with ESRD due to uncontrolled diabetes mellitus status post renal transplantation in 2019, CKD 3A, IDDM, HTN, diagnosed with COVID-19 on 12/3 came into the hospital with shortness of breath and hypoxia.  He has been having abdominal discomfort, nausea, vomiting and diarrhea for the past week.  Has felt weak, worsening fatigue and malaised.  He was supposed to get monoclonal antibodies on 12/6 but he was hypoxic and was admitted to the hospital.  His renal transplant team has been contacted and advised holding the Myfortic but continuing Prograf.  Subjective / 24h Interval events: States that he is feeling better.  No abdominal pain, no longer has hiccups  Assessment & Plan:  Principal Problem Acute Hypoxic Respiratory Failure due to Covid-19 Viral Illness -Patient is vaccinated however his immunosuppressed given renal transplant and immunosuppressives at home. -Remains hypoxic, currently on 15 L -Continue Remdesivir, steroids, not a candidate for Actemra given chronic immunosuppression -He was also started on antibiotics for bacterial pneumonia, complete antibiotics today -D-dimer slightly up, will scan his legs   COVID-19 Labs  Recent Labs    06/26/20 0401 06/27/20 0430 06/28/20 0453  DDIMER 1.94* 2.81* 3.00*  FERRITIN 1,187* 1,019* 838*  CRP 6.2* 4.0* 3.0*    Active Problems Renal transplant recipient, history of ESRD -Creatinine remains stable at 1.15 this morning.  Bicarb 19, BUN 38 -Continue tacrolimus, hold mycophenolate  Hypertension -Continue Norvasc, Coreg, lisinopril, clonidine, hydralazine.  Blood pressure stable on this regimen  Abdominal pain, nausea, vomiting -Abdominal pain seems to be better.  CT scan on admission unremarkable for acute findings.  This is probably due to  Covid  Hyperlipidemia -Continue statin  Hyponatremia -Sodium normalized with fluids  Insulin-dependent diabetes mellitus with hyper and hypoglycemia -Poorly controlled, now with steroid-induced hyperglycemia, A1c 8.9.  -He has been placed on his home [name] which he is agreeable to take.  CBG (last 3)  Recent Labs    06/28/20 0512 06/28/20 0745 06/28/20 1145  GLUCAP 306* 317* 262*    Scheduled Meds: . amLODipine  10 mg Oral Daily  . atorvastatin  20 mg Oral Daily  . carvedilol  12.5 mg Oral BID WC  . cloNIDine  0.1 mg Oral BID  . enoxaparin (LOVENOX) injection  40 mg Subcutaneous Q24H  . hydrALAZINE  75 mg Oral TID  . insulin aspart  0-15 Units Subcutaneous TID WC  . insulin aspart  0-5 Units Subcutaneous QHS  . insulin aspart  3 Units Subcutaneous TID WC  . insulin glargine  13 Units Subcutaneous QHS  . linagliptin  5 mg Oral Daily  . lisinopril  2.5 mg Oral Daily  . mouth rinse  15 mL Mouth Rinse BID  . methylPREDNISolone (SOLU-MEDROL) injection  60 mg Intravenous Q12H  . pantoprazole  20 mg Oral BID  . sodium chloride flush  3 mL Intravenous Q12H  . tacrolimus  1 mg Oral Daily   And  . tacrolimus  0.5 mg Oral QHS  . tamsulosin  0.4 mg Oral Daily   Continuous Infusions:  PRN Meds:.acetaminophen, baclofen, ondansetron **OR** ondansetron (ZOFRAN) IV, oxyCODONE, senna  DVT prophylaxis: Lovenox Code Status: Full code Family Communication: Discussed with patient in detail, discussed with daughter over the phone  Status is: Inpatient  Remains inpatient appropriate because:Inpatient level of care appropriate due to severity of illness  Dispo: The patient  is from: Home              Anticipated d/c is to: Home              Anticipated d/c date is: > 3 days              Patient currently is not medically stable to d/c.  Consultants:  None   Procedures:  None   Microbiology: None   Antibacterials: Ceftriaxone / Azithromycin    Objective: Vitals:    06/27/20 2024 06/27/20 2134 06/27/20 2135 06/28/20 0459  BP:  (!) 141/62 (!) 141/62 (!) 153/71  Pulse:   64 65  Resp:   (!) 22 20  Temp:   (!) 97.3 F (36.3 C) 97.6 F (36.4 C)  TempSrc:   Oral Oral  SpO2: 93%  (!) 87% 93%  Weight:      Height:        Intake/Output Summary (Last 24 hours) at 06/28/2020 1327 Last data filed at 06/28/2020 1027 Gross per 24 hour  Intake 1226 ml  Output --  Net 1226 ml   Filed Weights   06/25/2020 1437  Weight: 64 kg    Examination: Constitutional: No distress, in bed Eyes: No scleral icterus ENMT: Moist external drains Neck: normal, supple Respiratory: Bibasilar rhonchi, no wheezing or crackles, normal respiratory effort Cardiovascular: Regular rate and rhythm, no murmurs, no peripheral edema Abdomen: Soft, NT, ND, bowel sounds positive Musculoskeletal: no clubbing / cyanosis.  Skin: No rashes Neurologic: No focal deficits   Data Reviewed: I have independently reviewed following labs and imaging studies   CBC: Recent Labs  Lab 06/24/20 0423 06/25/20 0435 06/26/20 0401 06/27/20 0430 06/28/20 0453  WBC 4.7 5.7 10.8* 9.3 10.3  NEUTROABS 4.3 5.2 10.1* 8.7* 9.6*  HGB 13.6 12.9* 12.8* 12.7* 12.4*  HCT 41.9 39.4 39.7 38.7* 39.6  MCV 81.8 80.2 81.4 81.1 84.1  PLT 181 180 208 184 852   Basic Metabolic Panel: Recent Labs  Lab 06/24/20 0423 06/25/20 0435 06/26/20 0401 06/27/20 0430 06/27/20 2117 06/28/20 0453  NA 132* 131* 136 135  --  133*  K 4.4 3.7 3.9 4.9  --  4.7  CL 100 101 105 105  --  104  CO2 20* 20* 19* 20*  --  19*  GLUCOSE 190* 412* 116* 317* 488* 345*  BUN 23* 39* 43* 33*  --  38*  CREATININE 0.92 1.29* 1.28* 1.12  --  1.15  CALCIUM 7.8* 7.6* 7.7* 7.4*  --  7.4*  MG 2.2 2.3  --   --   --   --    GFR: Estimated Creatinine Clearance: 53.4 mL/min (by C-G formula based on SCr of 1.15 mg/dL). Liver Function Tests: Recent Labs  Lab 06/24/20 0423 06/25/20 0435 06/26/20 0401 06/27/20 0430 06/28/20 0453  AST 30  41 42* 40 43*  ALT 17 26 23 25  32  ALKPHOS 44 47 49 68 86  BILITOT 0.5 0.5 0.3 0.7 0.8  PROT 5.7* 5.0* 5.2* 5.1* 4.9*  ALBUMIN 2.5* 2.2* 2.4* 2.3* 2.3*   Recent Labs  Lab 07/01/2020 1538  LIPASE 19   No results for input(s): AMMONIA in the last 168 hours. Coagulation Profile: No results for input(s): INR, PROTIME in the last 168 hours. Cardiac Enzymes: No results for input(s): CKTOTAL, CKMB, CKMBINDEX, TROPONINI in the last 168 hours. BNP (last 3 results) No results for input(s): PROBNP in the last 8760 hours. HbA1C: No results for input(s): HGBA1C in the last 72  hours. CBG: Recent Labs  Lab 06/27/20 2125 06/28/20 0001 06/28/20 0512 06/28/20 0745 06/28/20 1145  GLUCAP 428* 389* 306* 317* 262*   Lipid Profile: No results for input(s): CHOL, HDL, LDLCALC, TRIG, CHOLHDL, LDLDIRECT in the last 72 hours. Thyroid Function Tests: No results for input(s): TSH, T4TOTAL, FREET4, T3FREE, THYROIDAB in the last 72 hours. Anemia Panel: Recent Labs    06/27/20 0430 06/28/20 0453  FERRITIN 1,019* 838*   Urine analysis:    Component Value Date/Time   COLORURINE YELLOW 07/08/2020 1528   APPEARANCEUR CLEAR 07/11/2020 1528   LABSPEC 1.027 06/18/2020 1528   PHURINE 5.0 07/15/2020 1528   GLUCOSEU >=500 (A) 06/18/2020 1528   HGBUR NEGATIVE 06/26/2020 1528   BILIRUBINUR NEGATIVE 07/11/2020 1528   KETONESUR 20 (A) 07/08/2020 1528   PROTEINUR >=300 (A) 06/18/2020 1528   NITRITE NEGATIVE 07/18/2020 1528   LEUKOCYTESUR NEGATIVE 07/05/2020 1528   Sepsis Labs: Invalid input(s): PROCALCITONIN, LACTICIDVEN  Recent Results (from the past 240 hour(s))  Blood Culture (routine x 2)     Status: None   Collection Time: 07/02/2020  3:39 PM   Specimen: BLOOD  Result Value Ref Range Status   Specimen Description   Final    BLOOD RIGHT WRIST Performed at [facility] [address], [place], [place] [address]    Special Requests   Final    BOTTLES DRAWN AEROBIC AND  ANAEROBIC Blood Culture results may not be optimal due to an inadequate volume of blood received in culture bottles Performed at [facility] [address] [place], [place] [address]    Culture   Final    NO GROWTH 5 DAYS Performed at [facility], [place] [address], [place], [place] [address]    Report Status 06/28/2020 FINAL  Final  Blood Culture (routine x 2)     Status: None   Collection Time: 06/28/2020  3:39 PM   Specimen: BLOOD  Result Value Ref Range Status   Specimen Description   Final    BLOOD RIGHT ARM Performed at [facility] [address] [place], [place] [address]    Special Requests   Final    BOTTLES DRAWN AEROBIC AND ANAEROBIC Blood Culture adequate volume Performed at [facility] [address], [place], [place] [address]    Culture   Final    NO GROWTH 5 DAYS Performed at [facility], [place] [address] [place],  [address]    Report Status 06/28/2020 FINAL  Final      Radiology Studies: No results found.  Marzetta Board, MD, PhD [facility]  Between 7 am - 7 pm I am available, please contact me via Amion or Securechat  Between 7 pm - 7 am I am not available, please contact night coverage MD/APP via Amion

## 2020-06-28 NOTE — Progress Notes (Addendum)
Pt status update:  Pt had high BGMs today 400-500s.  RN got video interpreter and explained to patient the importance of taking his insulin especially when his BGMs were that elevated.  PT agreed to take insulin only if staff would check his BGM more frequently "because she was worried he would drop low."  RN agreed to test pts BGMs more frequently and at pts request.      Pt complaining of extreme thirst, pt seems slightly confused (said he got up and went to bathroom with staff but hadn't gotten up), looked flush, and complained of feeling hot and sweaty.  Rapid response called.  [place] was rescheduled as pt had previously declined his 1800 dose.    BGM rechecked several hours later.  BGM trending down but still high.  Will continue to monitor BGM.

## 2020-06-29 ENCOUNTER — Inpatient Hospital Stay (HOSPITAL_COMMUNITY): Payer: Medicare Other

## 2020-06-29 DIAGNOSIS — U071 COVID-19: Secondary | ICD-10-CM

## 2020-06-29 DIAGNOSIS — R7989 Other specified abnormal findings of blood chemistry: Secondary | ICD-10-CM

## 2020-06-29 LAB — CBC
HCT: 39.2 % (ref 39.0–52.0)
Hemoglobin: 12.7 g/dL — ABNORMAL LOW (ref 13.0–17.0)
MCH: 26.5 pg (ref 26.0–34.0)
MCHC: 32.4 g/dL (ref 30.0–36.0)
MCV: 81.8 fL (ref 80.0–100.0)
Platelets: 149 10*3/uL — ABNORMAL LOW (ref 150–400)
RBC: 4.79 MIL/uL (ref 4.22–5.81)
RDW: 15.4 % (ref 11.5–15.5)
WBC: 16.2 10*3/uL — ABNORMAL HIGH (ref 4.0–10.5)
nRBC: 0.1 % (ref 0.0–0.2)

## 2020-06-29 LAB — GLUCOSE, CAPILLARY
Glucose-Capillary: 58 mg/dL — ABNORMAL LOW (ref 70–99)
Glucose-Capillary: 72 mg/dL (ref 70–99)
Glucose-Capillary: 171 mg/dL — ABNORMAL HIGH (ref 70–99)
Glucose-Capillary: 179 mg/dL — ABNORMAL HIGH (ref 70–99)
Glucose-Capillary: 224 mg/dL — ABNORMAL HIGH (ref 70–99)
Glucose-Capillary: 283 mg/dL — ABNORMAL HIGH (ref 70–99)
Glucose-Capillary: 328 mg/dL — ABNORMAL HIGH (ref 70–99)
Glucose-Capillary: 341 mg/dL — ABNORMAL HIGH (ref 70–99)

## 2020-06-29 LAB — D-DIMER, QUANTITATIVE: D-Dimer, Quant: 13.5 ug/mL-FEU — ABNORMAL HIGH (ref 0.00–0.50)

## 2020-06-29 LAB — COMPREHENSIVE METABOLIC PANEL
ALT: 31 U/L (ref 0–44)
AST: 43 U/L — ABNORMAL HIGH (ref 15–41)
Albumin: 2.3 g/dL — ABNORMAL LOW (ref 3.5–5.0)
Alkaline Phosphatase: 93 U/L (ref 38–126)
Anion gap: 10 (ref 5–15)
BUN: 35 mg/dL — ABNORMAL HIGH (ref 6–20)
CO2: 21 mmol/L — ABNORMAL LOW (ref 22–32)
Calcium: 7.7 mg/dL — ABNORMAL LOW (ref 8.9–10.3)
Chloride: 102 mmol/L (ref 98–111)
Creatinine, Ser: 1.13 mg/dL (ref 0.61–1.24)
GFR, Estimated: >60 mL/min (ref >60)
Glucose, Bld: 187 mg/dL — ABNORMAL HIGH (ref 70–99)
Potassium: 5.2 mmol/L — ABNORMAL HIGH (ref 3.5–5.1)
Sodium: 133 mmol/L — ABNORMAL LOW (ref 135–145)
Total Bilirubin: 0.6 mg/dL (ref 0.3–1.2)
Total Protein: 5.1 g/dL — ABNORMAL LOW (ref 6.5–8.1)

## 2020-06-29 LAB — C-REACTIVE PROTEIN: CRP: 3 mg/dL — ABNORMAL HIGH (ref <1.0)

## 2020-06-29 LAB — MAGNESIUM: Magnesium: 2.6 mg/dL — ABNORMAL HIGH (ref 1.7–2.4)

## 2020-06-29 MED ORDER — INSULIN GLARGINE 100 UNIT/ML ~~LOC~~ SOLN
8.0000 [IU] | Freq: Every day | SUBCUTANEOUS | Status: DC
  Administered 2020-06-29 – 2020-06-30 (×2): 8 [IU] via SUBCUTANEOUS
  Filled 2020-06-29 (×3): qty 0.08

## 2020-06-29 MED ORDER — ENOXAPARIN SODIUM 80 MG/0.8ML ~~LOC~~ SOLN
1.0000 mg/kg | Freq: Two times a day (BID) | SUBCUTANEOUS | Status: DC
  Administered 2020-06-29 – 2020-07-04 (×11): 65 mg via SUBCUTANEOUS
  Filled 2020-06-29 (×11): qty 0.8

## 2020-06-29 MED ORDER — SODIUM ZIRCONIUM CYCLOSILICATE 5 G PO PACK
5.0000 g | PACK | Freq: Once | ORAL | Status: AC
  Administered 2020-06-29: 08:00:00 5 g via ORAL
  Filled 2020-06-29: qty 1

## 2020-06-29 MED ORDER — SODIUM ZIRCONIUM CYCLOSILICATE 10 G PO PACK: 10.0000 g | PACK | Freq: Once | ORAL | Status: DC

## 2020-06-29 NOTE — Progress Notes (Signed)
Hypoglycemic Event  CBG: 58   Treatment: 4 oz juice/soda (pt insisted on drinking 4 juices instead of just 1)  Symptoms: Shaky  Follow-up CBG: Time:0124 CBG Result:72  Possible Reasons for Event: Medication regimen: now taking his insulin.  Previously refusing  Comments/MD notified:    Herbert Pun

## 2020-06-29 NOTE — Progress Notes (Signed)
Lower extremity venous has been completed.   Preliminary results in CV Proc.   Abram Sander 06/29/2020 10:20 AM

## 2020-06-29 NOTE — Progress Notes (Signed)
PROGRESS NOTE  Craig Fisher:[id number] DOB: 09/27/1960 DOA: 06/30/2020 PCP: Sharion Balloon, FNP   LOS: 6 days   Brief Narrative / Interim history: 59 year old male with ESRD due to uncontrolled diabetes mellitus status post renal transplantation in 2019, CKD 3A, IDDM, HTN, diagnosed with COVID-19 on 12/3 came into the hospital with shortness of breath and hypoxia.  He has been having abdominal discomfort, nausea, vomiting and diarrhea for the past week.  Has felt weak, worsening fatigue and malaised.  He was supposed to get monoclonal antibodies on 12/6 but he was hypoxic and was admitted to the hospital.  His renal transplant team has been contacted and advised holding the Myfortic but continuing Prograf.  Subjective / 24h Interval events: Overall feeling well, breathing is better, denies any abdominal pain.  No more hiccups.  Very scared this morning that he had another hypoglycemic episode last night  Assessment & Plan:  Principal Problem Acute Hypoxic Respiratory Failure due to Covid-19 Viral Illness -Patient is vaccinated however his immunosuppressed given renal transplant and immunosuppressives at home. -Currently remains on 15 L but overall appears comfortable -Continue Remdesivir, steroids, not a candidate for Actemra given chronic immunosuppression -He was also started on antibiotics for bacterial pneumonia, completed 5 days -D-dimer continues to rise, awaiting lower extremities Scan.  Even if negative may benefit from full dose anticoagulation until we can get a VQ scan tomorrow   COVID-19 Labs  Recent Labs    06/27/20 0430 06/28/20 0453 06/29/20 0414  DDIMER 2.81* 3.00* 13.50*  FERRITIN 1,019* 838*  --   CRP 4.0* 3.0* 3.0*    Active Problems Renal transplant recipient, history of ESRD -Creatinine remains stable today. -Continue tacrolimus, hold mycophenolate  Hypertension -Continue Norvasc, Coreg, lisinopril, clonidine, hydralazine.  Blood pressure  stable  Abdominal pain, nausea, vomiting -Abdominal pain seems to be better.  CT scan on admission unremarkable for acute findings.  Due to Covid, improved  Hyperlipidemia -Continue statin  Hyponatremia -Sodium normalized with fluids  Insulin-dependent diabetes mellitus with hyper and hypoglycemia -Poorly controlled, now with steroid-induced hyperglycemia, A1c 8.9.  -He has been placed on his home [name] which he is agreeable to take. -Very brittle diabetes even with his home [name] he had a hypoglycemic episode.  Further decrease [name], stop scheduled mealtime insulin  CBG (last 3)  Recent Labs    06/29/20 0250 06/29/20 0543 06/29/20 0821  GLUCAP 171* 179* 224*    Scheduled Meds: . amLODipine  10 mg Oral Daily  . atorvastatin  20 mg Oral Daily  . carvedilol  12.5 mg Oral BID WC  . cloNIDine  0.1 mg Oral BID  . enoxaparin (LOVENOX) injection  40 mg Subcutaneous Q24H  . hydrALAZINE  75 mg Oral TID  . insulin aspart  0-15 Units Subcutaneous TID WC  . insulin aspart  0-5 Units Subcutaneous QHS  . insulin glargine  8 Units Subcutaneous QHS  . linagliptin  5 mg Oral Daily  . lisinopril  2.5 mg Oral Daily  . mouth rinse  15 mL Mouth Rinse BID  . methylPREDNISolone (SOLU-MEDROL) injection  60 mg Intravenous Q12H  . pantoprazole  20 mg Oral BID  . sodium chloride flush  3 mL Intravenous Q12H  . tacrolimus  1 mg Oral Daily   And  . tacrolimus  0.5 mg Oral QHS  . tamsulosin  0.4 mg Oral Daily   Continuous Infusions:  PRN Meds:.acetaminophen, baclofen, ondansetron **OR** ondansetron (ZOFRAN) IV, oxyCODONE, senna  DVT prophylaxis: Lovenox Code Status: Full code  Family Communication: Discussed with patient in detail, discussed with daughter over the phone  Status is: Inpatient  Remains inpatient appropriate because:Inpatient level of care appropriate due to severity of illness  Dispo: The patient is from: Home              Anticipated d/c is to: Home               Anticipated d/c date is: > 3 days              Patient currently is not medically stable to d/c.  Consultants:  None   Procedures:  None   Microbiology: None   Antibacterials: Ceftriaxone / Azithromycin    Objective: Vitals:   06/28/20 2148 06/28/20 2220 06/29/20 0503 06/29/20 0812  BP: (!) 160/74 140/70 (!) 148/70 (!) 154/71  Pulse: 63 65 66 68  Resp: 20  (!) 24 20  Temp: 97.6 F (36.4 C)  98 F (36.7 C) 98 F (36.7 C)  TempSrc: Oral  Oral Oral  SpO2: 94% (!) 85% (!) 83% 91%  Weight:      Height:        Intake/Output Summary (Last 24 hours) at 06/29/2020 1610 Last data filed at 06/28/2020 2035 Gross per 24 hour  Intake 1200 ml  Output --  Net 1200 ml   Filed Weights   06/22/2020 1437  Weight: 64 kg    Examination: Constitutional: NAD, in bed Eyes: No icterus ENMT: mmm Neck: normal, supple Respiratory: Bibasilar rhonchi, no wheezing, normal respiratory effort Cardiovascular: Regular rate and rhythm, no murmurs, no edema Abdomen: Soft, nontender, nondistended, bowel sounds positive Musculoskeletal: no clubbing / cyanosis.  Skin: No rashes seen Neurologic: Nonfocal   Data Reviewed: I have independently reviewed following labs and imaging studies   CBC: Recent Labs  Lab 06/24/20 0423 06/25/20 0435 06/26/20 0401 06/27/20 0430 06/28/20 0453 06/29/20 0414  WBC 4.7 5.7 10.8* 9.3 10.3 16.2*  NEUTROABS 4.3 5.2 10.1* 8.7* 9.6*  --   HGB 13.6 12.9* 12.8* 12.7* 12.4* 12.7*  HCT 41.9 39.4 39.7 38.7* 39.6 39.2  MCV 81.8 80.2 81.4 81.1 84.1 81.8  PLT 181 180 208 184 170 960*   Basic Metabolic Panel: Recent Labs  Lab 06/24/20 0423 06/25/20 0435 06/26/20 0401 06/27/20 0430 06/27/20 2117 06/28/20 0453 06/29/20 0414  NA 132* 131* 136 135  --  133* 133*  K 4.4 3.7 3.9 4.9  --  4.7 5.2*  CL 100 101 105 105  --  104 102  CO2 20* 20* 19* 20*  --  19* 21*  GLUCOSE 190* 412* 116* 317* 488* 345* 187*  BUN 23* 39* 43* 33*  --  38* 35*  CREATININE 0.92  1.29* 1.28* 1.12  --  1.15 1.13  CALCIUM 7.8* 7.6* 7.7* 7.4*  --  7.4* 7.7*  MG 2.2 2.3  --   --   --   --  2.6*   GFR: Estimated Creatinine Clearance: 54.4 mL/min (by C-G formula based on SCr of 1.13 mg/dL). Liver Function Tests: Recent Labs  Lab 06/25/20 0435 06/26/20 0401 06/27/20 0430 06/28/20 0453 06/29/20 0414  AST 41 42* 40 43* 43*  ALT 26 23 25  32 31  ALKPHOS 47 49 68 86 93  BILITOT 0.5 0.3 0.7 0.8 0.6  PROT 5.0* 5.2* 5.1* 4.9* 5.1*  ALBUMIN 2.2* 2.4* 2.3* 2.3* 2.3*   Recent Labs  Lab 06/22/2020 1538  LIPASE 19   No results for input(s): AMMONIA in the last 168 hours. Coagulation  Profile: No results for input(s): INR, PROTIME in the last 168 hours. Cardiac Enzymes: No results for input(s): CKTOTAL, CKMB, CKMBINDEX, TROPONINI in the last 168 hours. BNP (last 3 results) No results for input(s): PROBNP in the last 8760 hours. HbA1C: No results for input(s): HGBA1C in the last 72 hours. CBG: Recent Labs  Lab 06/29/20 0102 06/29/20 0124 06/29/20 0250 06/29/20 0543 06/29/20 0821  GLUCAP 58* 72 171* 179* 224*   Lipid Profile: No results for input(s): CHOL, HDL, LDLCALC, TRIG, CHOLHDL, LDLDIRECT in the last 72 hours. Thyroid Function Tests: No results for input(s): TSH, T4TOTAL, FREET4, T3FREE, THYROIDAB in the last 72 hours. Anemia Panel: Recent Labs    06/27/20 0430 06/28/20 0453  FERRITIN 1,019* 838*   Urine analysis:    Component Value Date/Time   COLORURINE YELLOW 07/16/2020 1528   APPEARANCEUR CLEAR 07/16/2020 1528   LABSPEC 1.027 06/22/2020 1528   PHURINE 5.0 07/04/2020 1528   GLUCOSEU >=500 (A) 06/19/2020 1528   HGBUR NEGATIVE 06/20/2020 1528   BILIRUBINUR NEGATIVE 07/02/2020 1528   KETONESUR 20 (A) 06/29/2020 1528   PROTEINUR >=300 (A) 07/03/2020 1528   NITRITE NEGATIVE 07/04/2020 1528   LEUKOCYTESUR NEGATIVE 07/01/2020 1528   Sepsis Labs: Invalid input(s): PROCALCITONIN, LACTICIDVEN  Recent Results (from the past 240 hour(s))  Blood  Culture (routine x 2)     Status: None   Collection Time: 07/09/2020  3:39 PM   Specimen: BLOOD  Result Value Ref Range Status   Specimen Description   Final    BLOOD RIGHT WRIST Performed at [facility] [address], [place], [place] [address]    Special Requests   Final    BOTTLES DRAWN AEROBIC AND ANAEROBIC Blood Culture results may not be optimal due to an inadequate volume of blood received in culture bottles Performed at [facility] [address] [place], [place] [address]    Culture   Final    NO GROWTH 5 DAYS Performed at [facility], [place] [address], [place], [place] [address]    Report Status 06/28/2020 FINAL  Final  Blood Culture (routine x 2)     Status: None   Collection Time: 06/28/2020  3:39 PM   Specimen: BLOOD  Result Value Ref Range Status   Specimen Description   Final    BLOOD RIGHT ARM Performed at [facility] [address] [place], [place] [address]    Special Requests   Final    BOTTLES DRAWN AEROBIC AND ANAEROBIC Blood Culture adequate volume Performed at [facility] [address], [place], [place] [address]    Culture   Final    NO GROWTH 5 DAYS Performed at [facility], [place] [address], [place], [place] [address]    Report Status 06/28/2020 FINAL  Final      Radiology Studies: No results found.  Marzetta Board, MD, PhD [facility]  Between 7 am - 7 pm I am available, please contact me via Amion or Securechat  Between 7 pm - 7 am I am not available, please contact night coverage MD/APP via Amion

## 2020-06-29 NOTE — Plan of Care (Signed)
POC discussed with pt, pt alert and oriented x4, shows no signs of distress, VSS. Pt O2 sats at 93% on 15L HFNC. Pt complains of no pain. Bed wheels locked, nonskid footwear applied OOB, call bell within reach, encouraged to use call bell for assistance, no falls during shift  Problem: Education: Goal: Knowledge of risk factors and measures for prevention of condition will improve Outcome: Progressing   Problem: Coping: Goal: Psychosocial and spiritual needs will be supported Outcome: Progressing   Problem: Respiratory: Goal: Will maintain a patent airway Outcome: Progressing Goal: Complications related to the disease process, condition or treatment will be avoided or minimized Outcome: Progressing   Problem: Activity: Goal: Ability to tolerate increased activity will improve Outcome: Progressing   Problem: Respiratory: Goal: Ability to maintain a clear airway will improve Outcome: Progressing   Problem: Pain Managment: Goal: General experience of comfort will improve Outcome: Progressing   Problem: Skin Integrity: Goal: Risk for impaired skin integrity will decrease Outcome: Progressing   Problem: Education: Goal: Knowledge of General Education information will improve Description: Including pain rating scale, medication(s)/side effects and non-pharmacologic comfort measures Outcome: Progressing   Problem: Health Behavior/Discharge Planning: Goal: Ability to manage health-related needs will improve Outcome: Progressing   Problem: Clinical Measurements: Goal: Ability to maintain clinical measurements within normal limits will improve Outcome: Progressing Goal: Will remain free from infection Outcome: Progressing Goal: Diagnostic test results will improve Outcome: Progressing Goal: Respiratory complications will improve Outcome: Progressing Goal: Cardiovascular complication will be avoided Outcome: Progressing   Problem: Activity: Goal: Risk for activity  intolerance will decrease Outcome: Progressing   Problem: Nutrition: Goal: Adequate nutrition will be maintained Outcome: Progressing   Problem: Coping: Goal: Level of anxiety will decrease Outcome: Progressing   Problem: Elimination: Goal: Will not experience complications related to bowel motility Outcome: Progressing Goal: Will not experience complications related to urinary retention Outcome: Progressing   Problem: Pain Managment: Goal: General experience of comfort will improve Outcome: Progressing   Problem: Safety: Goal: Ability to remain free from injury will improve Outcome: Progressing   Problem: Skin Integrity: Goal: Risk for impaired skin integrity will decrease Outcome: Progressing

## 2020-06-30 LAB — COMPREHENSIVE METABOLIC PANEL
ALT: 29 U/L (ref 0–44)
AST: 30 U/L (ref 15–41)
Albumin: 2.3 g/dL — ABNORMAL LOW (ref 3.5–5.0)
Alkaline Phosphatase: 80 U/L (ref 38–126)
Anion gap: 10 (ref 5–15)
BUN: 35 mg/dL — ABNORMAL HIGH (ref 6–20)
CO2: 20 mmol/L — ABNORMAL LOW (ref 22–32)
Calcium: 7.5 mg/dL — ABNORMAL LOW (ref 8.9–10.3)
Chloride: 102 mmol/L (ref 98–111)
Creatinine, Ser: 1.13 mg/dL (ref 0.61–1.24)
GFR, Estimated: >60 mL/min (ref >60)
Glucose, Bld: 325 mg/dL — ABNORMAL HIGH (ref 70–99)
Potassium: 4.7 mmol/L (ref 3.5–5.1)
Sodium: 132 mmol/L — ABNORMAL LOW (ref 135–145)
Total Bilirubin: 0.6 mg/dL (ref 0.3–1.2)
Total Protein: 4.9 g/dL — ABNORMAL LOW (ref 6.5–8.1)

## 2020-06-30 LAB — CBC
HCT: 37.7 % — ABNORMAL LOW (ref 39.0–52.0)
Hemoglobin: 12.2 g/dL — ABNORMAL LOW (ref 13.0–17.0)
MCH: 26.6 pg (ref 26.0–34.0)
MCHC: 32.4 g/dL (ref 30.0–36.0)
MCV: 82.1 fL (ref 80.0–100.0)
Platelets: 145 10*3/uL — ABNORMAL LOW (ref 150–400)
RBC: 4.59 MIL/uL (ref 4.22–5.81)
RDW: 15.6 % — ABNORMAL HIGH (ref 11.5–15.5)
WBC: 13.3 10*3/uL — ABNORMAL HIGH (ref 4.0–10.5)
nRBC: 0 % (ref 0.0–0.2)

## 2020-06-30 LAB — C-REACTIVE PROTEIN: CRP: 4.1 mg/dL — ABNORMAL HIGH (ref <1.0)

## 2020-06-30 LAB — GLUCOSE, CAPILLARY
Glucose-Capillary: 263 mg/dL — ABNORMAL HIGH (ref 70–99)
Glucose-Capillary: 233 mg/dL — ABNORMAL HIGH (ref 70–99)
Glucose-Capillary: 272 mg/dL — ABNORMAL HIGH (ref 70–99)
Glucose-Capillary: 316 mg/dL — ABNORMAL HIGH (ref 70–99)

## 2020-06-30 LAB — CREATININE, SERUM
Creatinine, Ser: 1.11 mg/dL (ref 0.61–1.24)
GFR, Estimated: >60 mL/min (ref >60)

## 2020-06-30 MED ORDER — CHLORPROMAZINE HCL 25 MG/ML IJ SOLN
25.0000 mg | Freq: Once | INTRAMUSCULAR | Status: AC
  Administered 2020-06-30: 21:00:00 25 mg via INTRAMUSCULAR
  Filled 2020-06-30: qty 1

## 2020-06-30 NOTE — Plan of Care (Signed)
  Problem: Education: Goal: Knowledge of risk factors and measures for prevention of condition will improve Outcome: Progressing   Problem: Coping: Goal: Psychosocial and spiritual needs will be supported Outcome: Progressing   Problem: Respiratory: Goal: Will maintain a patent airway Outcome: Progressing Goal: Complications related to the disease process, condition or treatment will be avoided or minimized Outcome: Progressing   Problem: Activity: Goal: Ability to tolerate increased activity will improve Outcome: Progressing   Problem: Respiratory: Goal: Ability to maintain a clear airway will improve Outcome: Progressing   Problem: Pain Managment: Goal: General experience of comfort will improve Outcome: Progressing   Problem: Skin Integrity: Goal: Risk for impaired skin integrity will decrease Outcome: Progressing   Problem: Education: Goal: Knowledge of General Education information will improve Description: Including pain rating scale, medication(s)/side effects and non-pharmacologic comfort measures Outcome: Progressing   Problem: Health Behavior/Discharge Planning: Goal: Ability to manage health-related needs will improve Outcome: Progressing   Problem: Clinical Measurements: Goal: Ability to maintain clinical measurements within normal limits will improve Outcome: Progressing Goal: Will remain free from infection Outcome: Progressing Goal: Diagnostic test results will improve Outcome: Progressing Goal: Respiratory complications will improve Outcome: Progressing Goal: Cardiovascular complication will be avoided Outcome: Progressing   Problem: Activity: Goal: Risk for activity intolerance will decrease Outcome: Progressing   Problem: Nutrition: Goal: Adequate nutrition will be maintained Outcome: Progressing   Problem: Coping: Goal: Level of anxiety will decrease Outcome: Progressing   Problem: Elimination: Goal: Will not experience complications  related to bowel motility Outcome: Progressing Goal: Will not experience complications related to urinary retention Outcome: Progressing   Problem: Pain Managment: Goal: General experience of comfort will improve Outcome: Progressing   Problem: Safety: Goal: Ability to remain free from injury will improve Outcome: Progressing   Problem: Skin Integrity: Goal: Risk for impaired skin integrity will decrease Outcome: Progressing

## 2020-06-30 NOTE — Progress Notes (Addendum)
Inpatient Diabetes Program Recommendations  AACE/ADA: New Consensus Statement on Inpatient Glycemic Control (2015)  Target Ranges:  Prepandial:   less than 140 mg/dL      Peak postprandial:   less than 180 mg/dL (1-2 hours)      Critically ill patients:  140 - 180 mg/dL   Results for Craig Fisher, Craig Fisher (MRN [id number]) as of 06/30/2020 08:12  Ref. Range 06/29/2020 08:21 06/29/2020 11:58 06/29/2020 16:51 06/29/2020 20:09  Glucose-Capillary Latest Ref Range: 70 - 99 mg/dL 224 (H)  REFUSED Novolog 283 (H)  REFUSED Novolog 328 (H)  REFUSED Novolog 341 (H)  REFUSED Novolog  8 units LANTUS   Results for Craig Fisher, Craig Fisher (MRN [id number]) as of 06/30/2020 08:12  Ref. Range 06/30/2020 07:26  Glucose-Capillary Latest Ref Range: 70 - 99 mg/dL 263 (H)     Home DM Meds: Tresiba 13 units Daily        Humalog 0-20 units TID per SSI  Current Orders: Lantus 8 units QHS      Novolog Moderate Correction Scale/ SSI (0-15 units) TID AC + HS      Tradjenta 5 mg Daily     Endocrinologist: Jacolyn Reedy, FNP with [facility]--last seen 02/22/2020--Was told to take the following: Tresiba 13 units QHS Start Januvia 100 mg Daily Continue Humalog SSI (200-300 give 2 units, >300 give 4 units)     MD- Note patient getting Solumedrol 60 mg BID.  REFUSED all doses of Novolog yesterday (12/12) despite education by RN.  Also Refused Tradjenta yesterday.  Did take 8 units Lantus last PM.  CBG 263 this AM  Please consider increasing the Lantus to 13 units QHS (home dose)    --Will follow patient during hospitalization--  Wyn Quaker RN, MSN, CDE Diabetes Coordinator Inpatient Glycemic Control Team Team Pager: [phone] (8a-5p)

## 2020-06-30 NOTE — Plan of Care (Signed)
POC discussed with pt, pt alert and oriented x4, shows no signs of distress, VSS. Pt currently on 15L HFNC. No complaints of pain. Bed wheels locked, nonskid footwear applied OOB, call bell within reach, encouraged to use call bell for assistance, no falls during shift  Problem: Education: Goal: Knowledge of risk factors and measures for prevention of condition will improve Outcome: Progressing   Problem: Coping: Goal: Psychosocial and spiritual needs will be supported Outcome: Progressing   Problem: Respiratory: Goal: Will maintain a patent airway Outcome: Progressing Goal: Complications related to the disease process, condition or treatment will be avoided or minimized Outcome: Progressing   Problem: Activity: Goal: Ability to tolerate increased activity will improve Outcome: Progressing   Problem: Respiratory: Goal: Ability to maintain a clear airway will improve Outcome: Progressing   Problem: Skin Integrity: Goal: Risk for impaired skin integrity will decrease Outcome: Progressing   Problem: Education: Goal: Knowledge of General Education information will improve Description: Including pain rating scale, medication(s)/side effects and non-pharmacologic comfort measures Outcome: Progressing   Problem: Health Behavior/Discharge Planning: Goal: Ability to manage health-related needs will improve Outcome: Progressing   Problem: Clinical Measurements: Goal: Ability to maintain clinical measurements within normal limits will improve Outcome: Progressing Goal: Will remain free from infection Outcome: Progressing Goal: Diagnostic test results will improve Outcome: Progressing Goal: Respiratory complications will improve Outcome: Progressing Goal: Cardiovascular complication will be avoided Outcome: Progressing   Problem: Activity: Goal: Risk for activity intolerance will decrease Outcome: Progressing   Problem: Nutrition: Goal: Adequate nutrition will be  maintained Outcome: Progressing   Problem: Coping: Goal: Level of anxiety will decrease Outcome: Progressing   Problem: Elimination: Goal: Will not experience complications related to bowel motility Outcome: Progressing Goal: Will not experience complications related to urinary retention Outcome: Progressing   Problem: Pain Managment: Goal: General experience of comfort will improve Outcome: Progressing   Problem: Safety: Goal: Ability to remain free from injury will improve Outcome: Progressing   Problem: Skin Integrity: Goal: Risk for impaired skin integrity will decrease Outcome: Progressing

## 2020-06-30 NOTE — Progress Notes (Signed)
PROGRESS NOTE  Craig Fisher WIO:[id number] DOB: [birth year] DOA: 06/21/2020 PCP: Sharion Balloon, FNP   LOS: 7 days   Brief Narrative / Interim history: 59 year old male with ESRD due to uncontrolled diabetes mellitus status post renal transplantation in 2019, CKD 3A, IDDM, HTN, diagnosed with COVID-19 on 12/3 came into the hospital with shortness of breath and hypoxia.  He has been having abdominal discomfort, nausea, vomiting and diarrhea for the past week.  Has felt weak, worsening fatigue and malaised.  He was supposed to get monoclonal antibodies on 12/6 but he was hypoxic and was admitted to the hospital.  His renal transplant team has been contacted and advised holding the Myfortic but continuing Prograf.  Subjective / 24h Interval events: No overnight events  Feeling well this morning, breathing is about the same.  Denies any chest pain, denies any abdominal pain, no nausea or vomiting.  Assessment & Plan:  Principal Problem Acute Hypoxic Respiratory Failure due to Covid-19 Viral Illness -Patient is vaccinated however his immunosuppressed given renal transplant and immunosuppressives at home. -Currently remains on 15 L but overall appears comfortable -Continue Remdesivir, steroids, not a candidate for Actemra given chronic immunosuppression -Status post 5 days of antibiotics for bacterial pneumonia. -Due to elevation in the D-dimer patient was placed on empiric anticoagulation with Lovenox on 4/12.  D-dimer this morning is pending.  Lower extremity negative for DVT.  Would like to avoid IV contrast due to renal transplant status.  A VQ scan was attempted today however patient is on too much oxygen and unable to lay flat.  COVID-19 Labs  Recent Labs    06/28/20 0453 06/29/20 0414 06/30/20 0911  DDIMER 3.00* 13.50*  --   FERRITIN 838*  --   --   CRP 3.0* 3.0* 4.1*    Active Problems Renal transplant recipient, history of ESRD -Creatinine remains stable today. -Continue  tacrolimus, hold mycophenolate  Hypertension -Continue Norvasc, Coreg, lisinopril, clonidine, hydralazine.  Blood pressure stable today  Abdominal pain, nausea, vomiting -Abdominal pain seems to be better.  CT scan on admission unremarkable for acute findings.  Due to Covid, improved  Hyperlipidemia -Continue statin  Hyponatremia -Sodium normalized with fluids  Insulin-dependent diabetes mellitus with hyper and hypoglycemia -Poorly controlled, now with steroid-induced hyperglycemia, A1c 8.9.  -He has been placed on his home [name] which he is agreeable to take. -Very brittle diabetes even with his home [name] he had a hypoglycemic episode.  Further decrease [name], stop scheduled mealtime insulin  CBG (last 3)  Recent Labs    06/29/20 1651 06/29/20 2009 06/30/20 0726  GLUCAP 328* 341* 263*    Scheduled Meds: . amLODipine  10 mg Oral Daily  . atorvastatin  20 mg Oral Daily  . carvedilol  12.5 mg Oral BID WC  . cloNIDine  0.1 mg Oral BID  . enoxaparin (LOVENOX) injection  1 mg/kg Subcutaneous Q12H  . hydrALAZINE  75 mg Oral TID  . insulin aspart  0-15 Units Subcutaneous TID WC  . insulin aspart  0-5 Units Subcutaneous QHS  . insulin glargine  8 Units Subcutaneous QHS  . linagliptin  5 mg Oral Daily  . lisinopril  2.5 mg Oral Daily  . mouth rinse  15 mL Mouth Rinse BID  . methylPREDNISolone (SOLU-MEDROL) injection  60 mg Intravenous Q12H  . pantoprazole  20 mg Oral BID  . sodium chloride flush  3 mL Intravenous Q12H  . tacrolimus  1 mg Oral Daily   And  . tacrolimus  0.5 mg  Oral QHS  . tamsulosin  0.4 mg Oral Daily   Continuous Infusions:  PRN Meds:.acetaminophen, baclofen, ondansetron **OR** ondansetron (ZOFRAN) IV, oxyCODONE, senna  DVT prophylaxis: Lovenox Code Status: Full code Family Communication: Discussed with patient in detail, discussed with daughter over the phone  Status is: Inpatient  Remains inpatient appropriate because:Inpatient level of care  appropriate due to severity of illness  Dispo: The patient is from: Home              Anticipated d/c is to: Home              Anticipated d/c date is: > 3 days              Patient currently is not medically stable to d/c.  Consultants:  None   Procedures:  None   Microbiology: None   Antibacterials: Ceftriaxone / Azithromycin    Objective: Vitals:   06/29/20 0812 06/29/20 1422 06/29/20 2008 06/30/20 0359  BP: (!) 154/71 (!) 169/73 (!) 168/76 (!) 172/78  Pulse: 68 68 80 72  Resp: 20 16 18 15   Temp: 98 F (36.7 C) 98.1 F (36.7 C) 98 F (36.7 C) (!) 97.5 F (36.4 C)  TempSrc: Oral  Oral Oral  SpO2: 91% 95% 94% 97%  Weight:      Height:        Intake/Output Summary (Last 24 hours) at 06/30/2020 1054 Last data filed at 06/29/2020 1500 Gross per 24 hour  Intake 240 ml  Output --  Net 240 ml   Filed Weights   07/01/2020 1437  Weight: 64 kg    Examination: Constitutional: No apparent distress, appears comfortable, he is in bed Eyes: No scleral icterus ENMT: mmm Neck: normal, supple Respiratory: Bibasilar rhonchi, no wheezing, normal respiratory effort Cardiovascular: Regular rate and rhythm, no murmurs, no edema Abdomen: Soft, nontender, nondistended, bowel sounds positive Musculoskeletal: no clubbing / cyanosis.  Skin: No rashes seen Neurologic: No focal deficits   Data Reviewed: I have independently reviewed following labs and imaging studies   CBC: Recent Labs  Lab 06/24/20 0423 06/25/20 0435 06/26/20 0401 06/27/20 0430 06/28/20 0453 06/29/20 0414 06/30/20 0351  WBC 4.7 5.7 10.8* 9.3 10.3 16.2* 13.3*  NEUTROABS 4.3 5.2 10.1* 8.7* 9.6*  --   --   HGB 13.6 12.9* 12.8* 12.7* 12.4* 12.7* 12.2*  HCT 41.9 39.4 39.7 38.7* 39.6 39.2 37.7*  MCV 81.8 80.2 81.4 81.1 84.1 81.8 82.1  PLT 181 180 208 184 170 149* 681*   Basic Metabolic Panel: Recent Labs  Lab 06/24/20 0423 06/25/20 0435 06/26/20 0401 06/27/20 0430 06/27/20 2117 06/28/20 0453  06/29/20 0414 06/30/20 0351  NA 132* 131* 136 135  --  133* 133* 132*  K 4.4 3.7 3.9 4.9  --  4.7 5.2* 4.7  CL 100 101 105 105  --  104 102 102  CO2 20* 20* 19* 20*  --  19* 21* 20*  GLUCOSE 190* 412* 116* 317* 488* 345* 187* 325*  BUN 23* 39* 43* 33*  --  38* 35* 35*  CREATININE 0.92 1.29* 1.28* 1.12  --  1.15 1.13 1.13  1.11  CALCIUM 7.8* 7.6* 7.7* 7.4*  --  7.4* 7.7* 7.5*  MG 2.2 2.3  --   --   --   --  2.6*  --    GFR: Estimated Creatinine Clearance: 55.3 mL/min (by C-G formula based on SCr of 1.11 mg/dL). Liver Function Tests: Recent Labs  Lab 06/26/20 0401 06/27/20 0430 06/28/20 0453 06/29/20  0414 06/30/20 0351  AST 42* 40 43* 43* 30  ALT 23 25 32 31 29  ALKPHOS 49 68 86 93 80  BILITOT 0.3 0.7 0.8 0.6 0.6  PROT 5.2* 5.1* 4.9* 5.1* 4.9*  ALBUMIN 2.4* 2.3* 2.3* 2.3* 2.3*   Recent Labs  Lab 06/20/2020 1538  LIPASE 19   No results for input(s): AMMONIA in the last 168 hours. Coagulation Profile: No results for input(s): INR, PROTIME in the last 168 hours. Cardiac Enzymes: No results for input(s): CKTOTAL, CKMB, CKMBINDEX, TROPONINI in the last 168 hours. BNP (last 3 results) No results for input(s): PROBNP in the last 8760 hours. HbA1C: No results for input(s): HGBA1C in the last 72 hours. CBG: Recent Labs  Lab 06/29/20 0821 06/29/20 1158 06/29/20 1651 06/29/20 2009 06/30/20 0726  GLUCAP 224* 283* 328* 341* 263*   Lipid Profile: No results for input(s): CHOL, HDL, LDLCALC, TRIG, CHOLHDL, LDLDIRECT in the last 72 hours. Thyroid Function Tests: No results for input(s): TSH, T4TOTAL, FREET4, T3FREE, THYROIDAB in the last 72 hours. Anemia Panel: Recent Labs    06/28/20 0453  FERRITIN 838*   Urine analysis:    Component Value Date/Time   COLORURINE YELLOW 07/17/2020 1528   APPEARANCEUR CLEAR 07/12/2020 1528   LABSPEC 1.027 07/07/2020 1528   PHURINE 5.0 06/24/2020 1528   GLUCOSEU >=500 (A) 07/03/2020 1528   HGBUR NEGATIVE 06/26/2020 1528    BILIRUBINUR NEGATIVE 07/15/2020 1528   KETONESUR 20 (A) 06/20/2020 1528   PROTEINUR >=300 (A) 07/16/2020 1528   NITRITE NEGATIVE 07/06/2020 1528   LEUKOCYTESUR NEGATIVE 06/26/2020 1528   Sepsis Labs: Invalid input(s): PROCALCITONIN, LACTICIDVEN  Recent Results (from the past 240 hour(s))  Blood Culture (routine x 2)     Status: None   Collection Time: 06/20/2020  3:39 PM   Specimen: BLOOD  Result Value Ref Range Status   Specimen Description   Final    BLOOD RIGHT WRIST Performed at [facility] [address] [place], [place] [address]    Special Requests   Final    BOTTLES DRAWN AEROBIC AND ANAEROBIC Blood Culture results may not be optimal due to an inadequate volume of blood received in culture bottles Performed at [facility] [address], [place], [place] [address]    Culture   Final    NO GROWTH 5 DAYS Performed at [facility], [place] [address] [place], [place] [address]    Report Status 06/28/2020 FINAL  Final  Blood Culture (routine x 2)     Status: None   Collection Time: 07/05/2020  3:39 PM   Specimen: BLOOD  Result Value Ref Range Status   Specimen Description   Final    BLOOD RIGHT ARM Performed at [facility] [address], [place], [place] [address]    Special Requests   Final    BOTTLES DRAWN AEROBIC AND ANAEROBIC Blood Culture adequate volume Performed at [facility] [address], [place], [place] [address]    Culture   Final    NO GROWTH 5 DAYS Performed at [facility], [place] [address] [place],  [address]    Report Status 06/28/2020 FINAL  Final      Radiology Studies: VAS [place] LOWER EXTREMITY VENOUS (DVT)  Result Date: 06/29/2020  Lower Venous DVT Study Indications: Covid, elevated ddimer.  Comparison Study: no prior Performing Technologist: Abram Sander RVS  Examination Guidelines: A complete evaluation includes B-mode imaging, spectral Doppler,  color  Doppler, and power Doppler as needed of all accessible portions of each vessel. Bilateral testing is considered an integral part of a complete examination. Limited examinations for reoccurring indications may be performed as noted. The reflux portion of the exam is performed with the patient in reverse Trendelenburg.  +---------+---------------+---------+-----------+----------+-------------------+ RIGHT    CompressibilityPhasicitySpontaneityPropertiesThrombus Aging      +---------+---------------+---------+-----------+----------+-------------------+ CFV      Full           Yes      Yes                                      +---------+---------------+---------+-----------+----------+-------------------+ SFJ      Full                                                             +---------+---------------+---------+-----------+----------+-------------------+ FV Prox  Full                                                             +---------+---------------+---------+-----------+----------+-------------------+ FV Mid   Full                                                             +---------+---------------+---------+-----------+----------+-------------------+ FV DistalFull                                                             +---------+---------------+---------+-----------+----------+-------------------+ PFV      Full                                                             +---------+---------------+---------+-----------+----------+-------------------+ POP      Full           Yes      Yes                                      +---------+---------------+---------+-----------+----------+-------------------+ PTV      Full                                                             +---------+---------------+---------+-----------+----------+-------------------+ PERO  Not well visualized  +---------+---------------+---------+-----------+----------+-------------------+   +---------+---------------+---------+-----------+----------+--------------+ LEFT     CompressibilityPhasicitySpontaneityPropertiesThrombus Aging +---------+---------------+---------+-----------+----------+--------------+ CFV      Full           Yes      Yes                                 +---------+---------------+---------+-----------+----------+--------------+ SFJ      Full                                                        +---------+---------------+---------+-----------+----------+--------------+ FV Prox  Full                                                        +---------+---------------+---------+-----------+----------+--------------+ FV Mid   Full                                                        +---------+---------------+---------+-----------+----------+--------------+ FV DistalFull                                                        +---------+---------------+---------+-----------+----------+--------------+ PFV      Full                                                        +---------+---------------+---------+-----------+----------+--------------+ POP      Full           Yes      Yes                                 +---------+---------------+---------+-----------+----------+--------------+ PTV      Full                                                        +---------+---------------+---------+-----------+----------+--------------+ PERO     Full                                                        +---------+---------------+---------+-----------+----------+--------------+     Summary: BILATERAL: - No evidence of deep vein thrombosis seen in the lower extremities, bilaterally. - No evidence of superficial venous thrombosis in the lower extremities, bilaterally. -No evidence of popliteal cyst, bilaterally.   *See  table(s) above for  measurements and observations.    Preliminary     Marzetta Board, MD, PhD [facility]  Between 7 am - 7 pm I am available, please contact me via Amion or Securechat  Between 7 pm - 7 am I am not available, please contact night coverage MD/APP via Amion

## 2020-07-01 ENCOUNTER — Inpatient Hospital Stay (HOSPITAL_COMMUNITY): Payer: Medicare Other

## 2020-07-01 LAB — COMPREHENSIVE METABOLIC PANEL
ALT: 26 U/L (ref 0–44)
AST: 25 U/L (ref 15–41)
Albumin: 2.4 g/dL — ABNORMAL LOW (ref 3.5–5.0)
Alkaline Phosphatase: 85 U/L (ref 38–126)
Anion gap: 12 (ref 5–15)
BUN: 33 mg/dL — ABNORMAL HIGH (ref 6–20)
CO2: 19 mmol/L — ABNORMAL LOW (ref 22–32)
Calcium: 7.7 mg/dL — ABNORMAL LOW (ref 8.9–10.3)
Chloride: 104 mmol/L (ref 98–111)
Creatinine, Ser: 1.09 mg/dL (ref 0.61–1.24)
GFR, Estimated: >60 mL/min (ref >60)
Glucose, Bld: 319 mg/dL — ABNORMAL HIGH (ref 70–99)
Potassium: 5 mmol/L (ref 3.5–5.1)
Sodium: 135 mmol/L (ref 135–145)
Total Bilirubin: 1 mg/dL (ref 0.3–1.2)
Total Protein: 5.2 g/dL — ABNORMAL LOW (ref 6.5–8.1)

## 2020-07-01 LAB — CBC
HCT: 38.4 % — ABNORMAL LOW (ref 39.0–52.0)
Hemoglobin: 12.4 g/dL — ABNORMAL LOW (ref 13.0–17.0)
MCH: 26.4 pg (ref 26.0–34.0)
MCHC: 32.3 g/dL (ref 30.0–36.0)
MCV: 81.9 fL (ref 80.0–100.0)
Platelets: 151 10*3/uL (ref 150–400)
RBC: 4.69 MIL/uL (ref 4.22–5.81)
RDW: 15.4 % (ref 11.5–15.5)
WBC: 11.3 10*3/uL — ABNORMAL HIGH (ref 4.0–10.5)
nRBC: 0 % (ref 0.0–0.2)

## 2020-07-01 LAB — GLUCOSE, CAPILLARY
Glucose-Capillary: 322 mg/dL — ABNORMAL HIGH (ref 70–99)
Glucose-Capillary: 330 mg/dL — ABNORMAL HIGH (ref 70–99)
Glucose-Capillary: 214 mg/dL — ABNORMAL HIGH (ref 70–99)
Glucose-Capillary: 97 mg/dL (ref 70–99)

## 2020-07-01 LAB — D-DIMER, QUANTITATIVE: D-Dimer, Quant: 3.32 ug/mL-FEU — ABNORMAL HIGH (ref 0.00–0.50)

## 2020-07-01 LAB — C-REACTIVE PROTEIN: CRP: 3 mg/dL — ABNORMAL HIGH (ref <1.0)

## 2020-07-01 MED ORDER — FUROSEMIDE 10 MG/ML IJ SOLN
20.0000 mg | Freq: Once | INTRAMUSCULAR | Status: AC
  Administered 2020-07-01: 13:00:00 20 mg via INTRAVENOUS
  Filled 2020-07-01: qty 2

## 2020-07-01 NOTE — Plan of Care (Signed)
  Problem: Education: Goal: Knowledge of risk factors and measures for prevention of condition will improve Outcome: Progressing   Problem: Coping: Goal: Psychosocial and spiritual needs will be supported Outcome: Progressing   Problem: Respiratory: Goal: Will maintain a patent airway Outcome: Progressing Goal: Complications related to the disease process, condition or treatment will be avoided or minimized Outcome: Progressing   Problem: Activity: Goal: Ability to tolerate increased activity will improve Outcome: Progressing   Problem: Respiratory: Goal: Ability to maintain a clear airway will improve Outcome: Progressing   Problem: Pain Managment: Goal: General experience of comfort will improve Outcome: Progressing   Problem: Skin Integrity: Goal: Risk for impaired skin integrity will decrease Outcome: Progressing   Problem: Education: Goal: Knowledge of General Education information will improve Description: Including pain rating scale, medication(s)/side effects and non-pharmacologic comfort measures Outcome: Progressing   Problem: Health Behavior/Discharge Planning: Goal: Ability to manage health-related needs will improve Outcome: Progressing   Problem: Clinical Measurements: Goal: Ability to maintain clinical measurements within normal limits will improve Outcome: Progressing Goal: Will remain free from infection Outcome: Progressing Goal: Diagnostic test results will improve Outcome: Progressing Goal: Respiratory complications will improve Outcome: Progressing Goal: Cardiovascular complication will be avoided Outcome: Progressing   Problem: Activity: Goal: Risk for activity intolerance will decrease Outcome: Progressing   Problem: Nutrition: Goal: Adequate nutrition will be maintained Outcome: Progressing   Problem: Coping: Goal: Level of anxiety will decrease Outcome: Progressing   Problem: Elimination: Goal: Will not experience complications  related to bowel motility Outcome: Progressing Goal: Will not experience complications related to urinary retention Outcome: Progressing   Problem: Pain Managment: Goal: General experience of comfort will improve Outcome: Progressing   Problem: Safety: Goal: Ability to remain free from injury will improve Outcome: Progressing   Problem: Skin Integrity: Goal: Risk for impaired skin integrity will decrease Outcome: Progressing

## 2020-07-01 NOTE — Progress Notes (Signed)
Inpatient Diabetes Program Recommendations  AACE/ADA: New Consensus Statement on Inpatient Glycemic Control (2015)  Target Ranges:  Prepandial:   less than 140 mg/dL      Peak postprandial:   less than 180 mg/dL (1-2 hours)      Critically ill patients:  140 - 180 mg/dL   Results for Craig Fisher, Craig Fisher (MRN [id number]) as of 07/01/2020 06:57  Ref. Range 06/29/2020 08:21 06/29/2020 11:58 06/29/2020 16:51 06/29/2020 20:09  Glucose-Capillary Latest Ref Range: 70 - 99 mg/dL 224 (H) 283 (H) 328 (H) 341 (H)   Results for Craig Fisher, Craig Fisher (MRN [id number]) as of 07/01/2020 06:57  Ref. Range 06/30/2020 07:26 06/30/2020 11:21 06/30/2020 16:24 06/30/2020 20:35  Glucose-Capillary Latest Ref Range: 70 - 99 mg/dL 263 (H) 233 (H) 272 (H) 316 (H)   Home DM Meds: Tresiba 13 units Daily                             Humalog 0-20 units TID per SSI  Current Orders: Lantus 8 units QHS                            Novolog Moderate Correction Scale/ SSI (0-15 units) TID AC + HS                            Tradjenta 5 mg Daily     Endocrinologist: Jacolyn Reedy, FNP with [facility]--last seen 02/22/2020--Was told to take the following: Tresiba 13 units QHS Start Januvia 100 mg Daily Continue Humalog SSI (200-300 give 2 units, >300 give 4 units)     MD- Note patient getting Solumedrol 60 mg BID.  REFUSED all doses of Novolog yesterday (12/13) and the day prior (12/12) despite education by RN.  Also RefusingTradjenta.  Did take 8 units Lantus last PM.  Lab glucose 319 this AM.  Please consider increasing the Lantus to 13 units QHS (home dose)    --Will follow patient during hospitalization--  Wyn Quaker RN, MSN, CDE Diabetes Coordinator Inpatient Glycemic Control Team Team Pager: [phone] (8a-5p)

## 2020-07-01 NOTE — Plan of Care (Signed)
°  Problem: Education: Goal: Knowledge of risk factors and measures for prevention of condition will improve Outcome: Progressing   Problem: Coping: Goal: Psychosocial and spiritual needs will be supported Outcome: Progressing   Problem: Respiratory: Goal: Will maintain a patent airway Outcome: Progressing Goal: Complications related to the disease process, condition or treatment will be avoided or minimized Outcome: Progressing   Problem: Activity: Goal: Ability to tolerate increased activity will improve Outcome: Progressing   Problem: Respiratory: Goal: Ability to maintain a clear airway will improve Outcome: Progressing   Problem: Pain Managment: Goal: General experience of comfort will improve Outcome: Progressing   Problem: Skin Integrity: Goal: Risk for impaired skin integrity will decrease Outcome: Progressing   Problem: Education: Goal: Knowledge of General Education information will improve Description: Including pain rating scale, medication(s)/side effects and non-pharmacologic comfort measures Outcome: Progressing   Problem: Health Behavior/Discharge Planning: Goal: Ability to manage health-related needs will improve Outcome: Progressing   Problem: Clinical Measurements: Goal: Ability to maintain clinical measurements within normal limits will improve Outcome: Progressing Goal: Will remain free from infection Outcome: Progressing Goal: Diagnostic test results will improve Outcome: Progressing Goal: Respiratory complications will improve Outcome: Progressing Goal: Cardiovascular complication will be avoided Outcome: Progressing   Problem: Activity: Goal: Risk for activity intolerance will decrease Outcome: Progressing   Problem: Nutrition: Goal: Adequate nutrition will be maintained Outcome: Progressing   Problem: Coping: Goal: Level of anxiety will decrease Outcome: Progressing   Problem: Elimination: Goal: Will not experience complications  related to bowel motility Outcome: Progressing Goal: Will not experience complications related to urinary retention Outcome: Progressing   Problem: Pain Managment: Goal: General experience of comfort will improve Outcome: Progressing   Problem: Safety: Goal: Ability to remain free from injury will improve Outcome: Progressing   Problem: Skin Integrity: Goal: Risk for impaired skin integrity will decrease Outcome: Progressing

## 2020-07-01 NOTE — Progress Notes (Addendum)
PROGRESS NOTE  Craig Fisher UJW:[id number] DOB: 03-07-1961 DOA: 06/25/2020 PCP: Sharion Balloon, FNP   LOS: 8 days   Brief Narrative / Interim history: 59 year old male with ESRD due to uncontrolled diabetes mellitus status post renal transplantation in 2019, CKD 3A, IDDM, HTN, diagnosed with COVID-19 on 12/3 came into the hospital with shortness of breath and hypoxia.  He has been having abdominal discomfort, nausea, vomiting and diarrhea for the past week.  Has felt weak, worsening fatigue and malaised.  He was supposed to get monoclonal antibodies on 12/6 but he was hypoxic and was admitted to the hospital.  His renal transplant team has been contacted and advised holding the Myfortic but continuing Prograf.  Subjective / 24h Interval events: No overnight events  States that he is doing well.  He denies any abdominal pain, no nausea or vomiting.  No longer has hiccups.  Breathing is about the same, gets a little bit short winded when he walks to the bathroom but tells me he recovers well.  Assessment & Plan:  Principal Problem Acute Hypoxic Respiratory Failure due to Covid-19 Viral Illness -Patient is vaccinated however his immunosuppressed given renal transplant and immunosuppressives at home. -Currently remains on 15 L but overall appears comfortable and overall stable -Continue Remdesivir, steroids, not a candidate for Actemra given chronic immunosuppression -Status post 5 days of antibiotics for bacterial pneumonia. -Due to elevation in the D-dimer patient was placed on empiric anticoagulation with Lovenox on 4/12.  D-dimer has since trending down.  Lower extremity negative for DVT.  Would like to avoid IV contrast due to renal transplant status.  A VQ scan was attempted however patient is on too much oxygen and unable to lay flat, perhaps could be obtained in the next few days -I&O on the positive side, give Lasix. Watch renal function closely.  COVID-19 Labs  Recent Labs     06/29/20 0414 06/30/20 0911 07/01/20 0350  DDIMER 13.50*  --  3.32*  CRP 3.0* 4.1* 3.0*    Active Problems Renal transplant recipient, history of ESRD -Creatinine remains stable today. -Continue tacrolimus, hold mycophenolate, family has been in contact with the transplant team and this is their recommendation  Hypertension -Continue Norvasc, Coreg, lisinopril, clonidine, hydralazine.  Blood pressure stable  Abdominal pain, nausea, vomiting -Abdominal pain seems to be better.  CT scan on admission unremarkable for acute findings.  Due to Covid, improved  Hyperlipidemia -Continue statin  Hyponatremia -Sodium normalized with fluids  Insulin-dependent diabetes mellitus with hyper and hypoglycemia -Poorly controlled, now with steroid-induced hyperglycemia, A1c 8.9.  -He has been placed on his home [name] which he is agreeable to take. -Very brittle diabetes even with his home [name] he had a hypoglycemic episode.  Further decrease [name], stop scheduled mealtime insulin.  CBGs in the 2-300s which is baseline for patient, he gets symptomatic hypoglycemia when he reaches 100 or less.  We will aim for a higher CBG for him  CBG (last 3)  Recent Labs    06/30/20 1624 06/30/20 2035 07/01/20 0711  GLUCAP 272* 316* 322*    Scheduled Meds: . amLODipine  10 mg Oral Daily  . atorvastatin  20 mg Oral Daily  . carvedilol  12.5 mg Oral BID WC  . cloNIDine  0.1 mg Oral BID  . enoxaparin (LOVENOX) injection  1 mg/kg Subcutaneous Q12H  . hydrALAZINE  75 mg Oral TID  . insulin aspart  0-15 Units Subcutaneous TID WC  . insulin aspart  0-5 Units Subcutaneous QHS  .  insulin glargine  8 Units Subcutaneous QHS  . linagliptin  5 mg Oral Daily  . lisinopril  2.5 mg Oral Daily  . mouth rinse  15 mL Mouth Rinse BID  . methylPREDNISolone (SOLU-MEDROL) injection  60 mg Intravenous Q12H  . pantoprazole  20 mg Oral BID  . sodium chloride flush  3 mL Intravenous Q12H  . tacrolimus  1 mg Oral  Daily   And  . tacrolimus  0.5 mg Oral QHS  . tamsulosin  0.4 mg Oral Daily   Continuous Infusions:  PRN Meds:.acetaminophen, baclofen, ondansetron **OR** ondansetron (ZOFRAN) IV, oxyCODONE, senna  DVT prophylaxis: Lovenox Code Status: Full code Family Communication: Discussed with patient in detail, discussed with daughter over the phone  Status is: Inpatient  Remains inpatient appropriate because:Inpatient level of care appropriate due to severity of illness  Dispo: The patient is from: Home              Anticipated d/c is to: Home              Anticipated d/c date is: > 3 days              Patient currently is not medically stable to d/c.  Consultants:  None   Procedures:  None   Microbiology: None   Antibacterials: Ceftriaxone / Azithromycin    Objective: Vitals:   06/30/20 1910 06/30/20 2015 06/30/20 2335 07/01/20 0400  BP:  (!) 145/67  (!) 159/74  Pulse:  64  68  Resp:  18  18  Temp:  98.2 F (36.8 C)  (!) 97.5 F (36.4 C)  TempSrc:    Oral  SpO2: 92% 92% 93% 97%  Weight:      Height:        Intake/Output Summary (Last 24 hours) at 07/01/2020 1048 Last data filed at 06/30/2020 2046 Gross per 24 hour  Intake 593 ml  Output --  Net 593 ml   Filed Weights   06/26/2020 1437  Weight: 64 kg    Examination: Constitutional: NAD, comfortable, in bed Eyes: No scleral icterus  ENMT: mmm Neck: normal, supple Respiratory: Bibasilar rhonchi, no wheezing, normal respiratory effort Cardiovascular: Regular rate and rhythm, no murmurs, no edema Abdomen: Soft, nontender, nondistended, positive bowel sounds Musculoskeletal: no clubbing / cyanosis.  Skin: No rashes Neurologic: Nonfocal   Data Reviewed: I have independently reviewed following labs and imaging studies   CBC: Recent Labs  Lab 06/25/20 0435 06/26/20 0401 06/27/20 0430 06/28/20 0453 06/29/20 0414 06/30/20 0351 07/01/20 0350  WBC 5.7 10.8* 9.3 10.3 16.2* 13.3* 11.3*  NEUTROABS 5.2 10.1*  8.7* 9.6*  --   --   --   HGB 12.9* 12.8* 12.7* 12.4* 12.7* 12.2* 12.4*  HCT 39.4 39.7 38.7* 39.6 39.2 37.7* 38.4*  MCV 80.2 81.4 81.1 84.1 81.8 82.1 81.9  PLT 180 208 184 170 149* 145* 448   Basic Metabolic Panel: Recent Labs  Lab 06/25/20 0435 06/26/20 0401 06/27/20 0430 06/27/20 2117 06/28/20 0453 06/29/20 0414 06/30/20 0351 07/01/20 0350  NA 131*   < > 135  --  133* 133* 132* 135  K 3.7   < > 4.9  --  4.7 5.2* 4.7 5.0  CL 101   < > 105  --  104 102 102 104  CO2 20*   < > 20*  --  19* 21* 20* 19*  GLUCOSE 412*   < > 317* 488* 345* 187* 325* 319*  BUN 39*   < > 33*  --  38* 35* 35* 33*  CREATININE 1.29*   < > 1.12  --  1.15 1.13 1.13  1.11 1.09  CALCIUM 7.6*   < > 7.4*  --  7.4* 7.7* 7.5* 7.7*  MG 2.3  --   --   --   --  2.6*  --   --    < > = values in this interval not displayed.   GFR: Estimated Creatinine Clearance: 56.4 mL/min (by C-G formula based on SCr of 1.09 mg/dL). Liver Function Tests: Recent Labs  Lab 06/27/20 0430 06/28/20 0453 06/29/20 0414 06/30/20 0351 07/01/20 0350  AST 40 43* 43* 30 25  ALT 25 32 31 29 26   ALKPHOS 68 86 93 80 85  BILITOT 0.7 0.8 0.6 0.6 1.0  PROT 5.1* 4.9* 5.1* 4.9* 5.2*  ALBUMIN 2.3* 2.3* 2.3* 2.3* 2.4*   No results for input(s): LIPASE, AMYLASE in the last 168 hours. No results for input(s): AMMONIA in the last 168 hours. Coagulation Profile: No results for input(s): INR, PROTIME in the last 168 hours. Cardiac Enzymes: No results for input(s): CKTOTAL, CKMB, CKMBINDEX, TROPONINI in the last 168 hours. BNP (last 3 results) No results for input(s): PROBNP in the last 8760 hours. HbA1C: No results for input(s): HGBA1C in the last 72 hours. CBG: Recent Labs  Lab 06/30/20 0726 06/30/20 1121 06/30/20 1624 06/30/20 2035 07/01/20 0711  GLUCAP 263* 233* 272* 316* 322*   Lipid Profile: No results for input(s): CHOL, HDL, LDLCALC, TRIG, CHOLHDL, LDLDIRECT in the last 72 hours. Thyroid Function Tests: No results for  input(s): TSH, T4TOTAL, FREET4, T3FREE, THYROIDAB in the last 72 hours. Anemia Panel: No results for input(s): VITAMINB12, FOLATE, FERRITIN, TIBC, IRON, RETICCTPCT in the last 72 hours. Urine analysis:    Component Value Date/Time   COLORURINE YELLOW 06/19/2020 1528   APPEARANCEUR CLEAR 06/22/2020 1528   LABSPEC 1.027 07/14/2020 1528   PHURINE 5.0 06/28/2020 1528   GLUCOSEU >=500 (A) 06/20/2020 1528   HGBUR NEGATIVE 07/06/2020 1528   BILIRUBINUR NEGATIVE 06/22/2020 1528   KETONESUR 20 (A) 06/28/2020 1528   PROTEINUR >=300 (A) 07/11/2020 1528   NITRITE NEGATIVE 06/25/2020 1528   LEUKOCYTESUR NEGATIVE 06/19/2020 1528   Sepsis Labs: Invalid input(s): PROCALCITONIN, LACTICIDVEN  Recent Results (from the past 240 hour(s))  Blood Culture (routine x 2)     Status: None   Collection Time: 06/25/2020  3:39 PM   Specimen: BLOOD  Result Value Ref Range Status   Specimen Description   Final    BLOOD RIGHT WRIST Performed at [facility] [address] [place], [place] [address]    Special Requests   Final    BOTTLES DRAWN AEROBIC AND ANAEROBIC Blood Culture results may not be optimal due to an inadequate volume of blood received in culture bottles Performed at [facility] [address] [place], [place] [address]    Culture   Final    NO GROWTH 5 DAYS Performed at [facility], [place] [address] [place],  [address]    Report Status 06/28/2020 FINAL  Final  Blood Culture (routine x 2)     Status: None   Collection Time: 07/02/2020  3:39 PM   Specimen: BLOOD  Result Value Ref Range Status   Specimen Description   Final    BLOOD RIGHT ARM Performed at [facility] [address], [place],  [address]    Special Requests   Final    BOTTLES DRAWN AEROBIC AND  ANAEROBIC Blood Culture adequate volume Performed at [facility] [address] [place], [place] [address]    Culture   Final     NO GROWTH 5 DAYS Performed at [facility], [place] [address], [place], [place] [address]    Report Status 06/28/2020 FINAL  Final      Radiology Studies: DG CHEST PORT 1 VIEW  Result Date: 07/01/2020 CLINICAL DATA:  Hypoxia and COVID-19 pneumonia. EXAM: PORTABLE CHEST 1 VIEW COMPARISON:  07/04/2020 FINDINGS: Stable heart size. Increased density of confluent airspace disease in the right mid and lower lung zones. Airspace disease of the left lower lung appears relatively stable by chest x-ray. No pneumothorax or visible pleural fluid. IMPRESSION: Increased density of confluent airspace disease in the right mid and lower lung zones. Stable left lower lung airspace disease. Electronically Signed   By: Aletta Edouard M.D.   On: 07/01/2020 09:03    Marzetta Board, MD, PhD [facility]  Between 7 am - 7 pm I am available, please contact me via Amion or Securechat  Between 7 pm - 7 am I am not available, please contact night coverage MD/APP via Amion

## 2020-07-02 ENCOUNTER — Encounter (HOSPITAL_COMMUNITY): Payer: Self-pay | Admitting: Family Medicine

## 2020-07-02 ENCOUNTER — Inpatient Hospital Stay (HOSPITAL_COMMUNITY): Payer: Medicare Other

## 2020-07-02 LAB — COMPREHENSIVE METABOLIC PANEL
ALT: 25 U/L (ref 0–44)
AST: 32 U/L (ref 15–41)
Albumin: 2.3 g/dL — ABNORMAL LOW (ref 3.5–5.0)
Alkaline Phosphatase: 77 U/L (ref 38–126)
Anion gap: 11 (ref 5–15)
BUN: 35 mg/dL — ABNORMAL HIGH (ref 6–20)
CO2: 23 mmol/L (ref 22–32)
Calcium: 7.8 mg/dL — ABNORMAL LOW (ref 8.9–10.3)
Chloride: 103 mmol/L (ref 98–111)
Creatinine, Ser: 1.09 mg/dL (ref 0.61–1.24)
GFR, Estimated: >60 mL/min (ref >60)
Glucose, Bld: 167 mg/dL — ABNORMAL HIGH (ref 70–99)
Potassium: 4.5 mmol/L (ref 3.5–5.1)
Sodium: 137 mmol/L (ref 135–145)
Total Bilirubin: 0.6 mg/dL (ref 0.3–1.2)
Total Protein: 5.1 g/dL — ABNORMAL LOW (ref 6.5–8.1)

## 2020-07-02 LAB — CBC
HCT: 38.8 % — ABNORMAL LOW (ref 39.0–52.0)
Hemoglobin: 12.3 g/dL — ABNORMAL LOW (ref 13.0–17.0)
MCH: 26 pg (ref 26.0–34.0)
MCHC: 31.7 g/dL (ref 30.0–36.0)
MCV: 82 fL (ref 80.0–100.0)
Platelets: 152 10*3/uL (ref 150–400)
RBC: 4.73 MIL/uL (ref 4.22–5.81)
RDW: 15.1 % (ref 11.5–15.5)
WBC: 16.6 10*3/uL — ABNORMAL HIGH (ref 4.0–10.5)
nRBC: 0 % (ref 0.0–0.2)

## 2020-07-02 LAB — C-REACTIVE PROTEIN: CRP: 1.7 mg/dL — ABNORMAL HIGH (ref <1.0)

## 2020-07-02 LAB — GLUCOSE, CAPILLARY
Glucose-Capillary: 52 mg/dL — ABNORMAL LOW (ref 70–99)
Glucose-Capillary: 208 mg/dL — ABNORMAL HIGH (ref 70–99)
Glucose-Capillary: 330 mg/dL — ABNORMAL HIGH (ref 70–99)
Glucose-Capillary: 405 mg/dL — ABNORMAL HIGH (ref 70–99)
Glucose-Capillary: 392 mg/dL — ABNORMAL HIGH (ref 70–99)

## 2020-07-02 LAB — D-DIMER, QUANTITATIVE: D-Dimer, Quant: 3.19 ug/mL-FEU — ABNORMAL HIGH (ref 0.00–0.50)

## 2020-07-02 LAB — BRAIN NATRIURETIC PEPTIDE: B Natriuretic Peptide: 140.8 pg/mL — ABNORMAL HIGH (ref 0.0–100.0)

## 2020-07-02 MED ORDER — SALINE SPRAY 0.65 % NA SOLN
2.0000 | NASAL | Status: DC | PRN
  Filled 2020-07-02: qty 44

## 2020-07-02 MED ORDER — INSULIN GLARGINE 100 UNIT/ML ~~LOC~~ SOLN
4.0000 [IU] | Freq: Every day | SUBCUTANEOUS | Status: DC
  Filled 2020-07-02: qty 0.04

## 2020-07-02 MED ORDER — INSULIN GLARGINE 100 UNIT/ML ~~LOC~~ SOLN
4.0000 [IU] | Freq: Every day | SUBCUTANEOUS | Status: DC
  Administered 2020-07-02 – 2020-07-07 (×6): 4 [IU] via SUBCUTANEOUS
  Filled 2020-07-02 (×7): qty 0.04

## 2020-07-02 MED ORDER — BARICITINIB 2 MG PO TABS
4.0000 mg | ORAL_TABLET | Freq: Every day | ORAL | Status: DC
  Administered 2020-07-03 – 2020-07-07 (×5): 4 mg via ORAL
  Filled 2020-07-02 (×7): qty 2

## 2020-07-02 MED ORDER — INSULIN DETEMIR 100 UNIT/ML ~~LOC~~ SOLN
12.0000 [IU] | Freq: Once | SUBCUTANEOUS | Status: DC
  Filled 2020-07-02: qty 0.12

## 2020-07-02 NOTE — Progress Notes (Signed)
Patient's CBG is 392 on this shift. Provider notified, LEVEMIR 12 units ordered, but patient refused. Stated daughter will bring [facility] tomorrow. Will notify provider. Shirlyn Goltz

## 2020-07-02 NOTE — Progress Notes (Signed)
Patient has refused novolog insulinsliding scale coverage for most of the day due to resulting low CBGs with complaint of blurred vision. Patient educated on Novolog and high blood sugar. MD made aware. Patient would prefer to be put on long acting insulin during the day as well. This nurse conveyed patients' concerns to MD.  Will continue to monitor.

## 2020-07-02 NOTE — Progress Notes (Addendum)
PROGRESS NOTE  Craig Fisher OZH:[id number] DOB: 06-04-1961 DOA: 07/11/2020 PCP: Sharion Balloon, FNP   LOS: 9 days   Brief Narrative / Interim history: 59 year old male with ESRD due to uncontrolled diabetes mellitus status post renal transplantation in 2019, CKD 3A, IDDM, HTN, diagnosed with COVID-19 on 12/3 came into the hospital with shortness of breath and hypoxia.  He has been having abdominal discomfort, nausea, vomiting and diarrhea for the past week.  Has felt weak, worsening fatigue and malaised.  He was supposed to get monoclonal antibodies on 12/6 but he was hypoxic and was admitted to the hospital.  His renal transplant team has been contacted and advised holding the Myfortic but continuing Prograf.  Subjective / 24h Interval events: No overnight events  Denies any complaints, no nausea, no vomiting, reports dyspnea mainly with activity.    Assessment & Plan:  Principal Problem Acute Hypoxic Respiratory Failure due to Covid-19 Viral Illness -Patient is vaccinated however his immunosuppressed given renal transplant and immunosuppressives at home. -Remains with significant oxygen requirement, he is on 15 L heated high flow nasal cannula and 15 L NRB. -Continue Remdesivir, steroids. -Status post 5 days of antibiotics for bacterial pneumonia. -I have discussed about baricitinib with the patient and daughter, explained risks and benefits, patient with no history of diverticulitis, bowel perforation, viral hepatitis or tuberculosis, they understand discussed and benefits, they are willing to proceed, will be started on baricitinib. -Due to elevation in the D-dimer patient was placed on empiric anticoagulation with Lovenox, D-dimer has since trending down.  Lower extremity negative for DVT.  Would like to avoid IV contrast due to renal transplant status.  A VQ scan was attempted however patient is on too much oxygen and unable to lay flat, perhaps could be obtained in the next few  days -I&O on the positive side, Lasix as needed, monitor renal function closely. COVID-19 Labs  Recent Labs    06/30/20 0911 07/01/20 0350 07/02/20 0405  DDIMER  --  3.32* 3.19*  CRP 4.1* 3.0* 1.7*    Active Problems Renal transplant recipient, history of ESRD -Creatinine remains stable today. -Continue tacrolimus, hold mycophenolate, family has been in contact with the transplant team and this is their recommendation  Hypertension -Continue Norvasc, Coreg, lisinopril, clonidine, hydralazine.  Blood pressure stable  Abdominal pain, nausea, vomiting -Abdominal pain seems to be better.  CT scan on admission unremarkable for acute findings.  Due to Covid, improved  Hyperlipidemia -Continue statin  Hyponatremia -Sodium normalized with fluids  Insulin-dependent diabetes mellitus with hyper and hypoglycemia -Poorly controlled, now with steroid-induced hyperglycemia, A1c 8.9.  -Are extremely brittle, with great variations, this morning hypoxic at 52, I will discontinue evening time sliding scale, will decrease his Lantus to 40 units twice daily, and will monitor CBG at 2 PM every 24 hours.. -We will have to tolerate some elevated CBGs in order to avoid significant hypoglycemic events.  CBG (last 3)  Recent Labs    07/02/20 0141 07/02/20 0737 07/02/20 1207  GLUCAP 52* 208* 330*    Scheduled Meds: . amLODipine  10 mg Oral Daily  . atorvastatin  20 mg Oral Daily  . carvedilol  12.5 mg Oral BID WC  . cloNIDine  0.1 mg Oral BID  . enoxaparin (LOVENOX) injection  1 mg/kg Subcutaneous Q12H  . hydrALAZINE  75 mg Oral TID  . insulin aspart  0-15 Units Subcutaneous TID WC  . insulin glargine  4 Units Subcutaneous QHS  . linagliptin  5 mg Oral Daily  .  lisinopril  2.5 mg Oral Daily  . mouth rinse  15 mL Mouth Rinse BID  . methylPREDNISolone (SOLU-MEDROL) injection  60 mg Intravenous Q12H  . pantoprazole  20 mg Oral BID  . sodium chloride flush  3 mL Intravenous Q12H  .  tacrolimus  1 mg Oral Daily   And  . tacrolimus  0.5 mg Oral QHS  . tamsulosin  0.4 mg Oral Daily   Continuous Infusions:  PRN Meds:.acetaminophen, baclofen, ondansetron **OR** ondansetron (ZOFRAN) IV, oxyCODONE, senna, sodium chloride  DVT prophylaxis: Lovenox Code Status: Full code Family Communication: Discussed with patient in detail. Status is: Inpatient  Remains inpatient appropriate because:Inpatient level of care appropriate due to severity of illness  Dispo: The patient is from: Home              Anticipated d/c is to: Home              Anticipated d/c date is: > 3 days              Patient currently is not medically stable to d/c.  Consultants:  None   Procedures:  None   Microbiology: None   Antibacterials: Ceftriaxone / Azithromycin    Objective: Vitals:   07/01/20 1347 07/01/20 2107 07/02/20 0328 07/02/20 1356  BP: 136/62 (!) 151/73 (!) 157/73 (!) 153/75  Pulse: 67 66 71 67  Resp: 19 (!) 21 (!) 21 20  Temp: 97.6 F (36.4 C) 97.8 F (36.6 C) 97.6 F (36.4 C) 98.3 F (36.8 C)  TempSrc:    Oral  SpO2: 93% 92% 90% (!) 87%  Weight:      Height:        Intake/Output Summary (Last 24 hours) at 07/02/2020 1408 Last data filed at 07/02/2020 1000 Gross per 24 hour  Intake 336 ml  Output --  Net 336 ml   Filed Weights   07/16/2020 1437  Weight: 64 kg    Examination:  Awake Alert, Oriented X 3, No new F.N deficits, Normal affect Symmetrical Chest wall movement, Good air movement bilaterally, scattered rhonchi RRR,No Gallops,Rubs or new Murmurs, No Parasternal Heave +ve B.Sounds, Abd Soft, No tenderness, No rebound - guarding or rigidity. No Cyanosis, Clubbing or edema, No new Rash or bruise     Data Reviewed: I have independently reviewed following labs and imaging studies   CBC: Recent Labs  Lab 06/26/20 0401 06/27/20 0430 06/28/20 0453 06/29/20 0414 06/30/20 0351 07/01/20 0350 07/02/20 0405  WBC 10.8* 9.3 10.3 16.2* 13.3* 11.3* 16.6*   NEUTROABS 10.1* 8.7* 9.6*  --   --   --   --   HGB 12.8* 12.7* 12.4* 12.7* 12.2* 12.4* 12.3*  HCT 39.7 38.7* 39.6 39.2 37.7* 38.4* 38.8*  MCV 81.4 81.1 84.1 81.8 82.1 81.9 82.0  PLT 208 184 170 149* 145* 151 637   Basic Metabolic Panel: Recent Labs  Lab 06/28/20 0453 06/29/20 0414 06/30/20 0351 07/01/20 0350 07/02/20 0405  NA 133* 133* 132* 135 137  K 4.7 5.2* 4.7 5.0 4.5  CL 104 102 102 104 103  CO2 19* 21* 20* 19* 23  GLUCOSE 345* 187* 325* 319* 167*  BUN 38* 35* 35* 33* 35*  CREATININE 1.15 1.13 1.13  1.11 1.09 1.09  CALCIUM 7.4* 7.7* 7.5* 7.7* 7.8*  MG  --  2.6*  --   --   --    GFR: Estimated Creatinine Clearance: 56.4 mL/min (by C-G formula based on SCr of 1.09 mg/dL). Liver Function Tests: Recent Labs  Lab 06/28/20 0453 06/29/20 0414 06/30/20 0351 07/01/20 0350 07/02/20 0405  AST 43* 43* 30 25 32  ALT 32 31 29 26 25   ALKPHOS 86 93 80 85 77  BILITOT 0.8 0.6 0.6 1.0 0.6  PROT 4.9* 5.1* 4.9* 5.2* 5.1*  ALBUMIN 2.3* 2.3* 2.3* 2.4* 2.3*   No results for input(s): LIPASE, AMYLASE in the last 168 hours. No results for input(s): AMMONIA in the last 168 hours. Coagulation Profile: No results for input(s): INR, PROTIME in the last 168 hours. Cardiac Enzymes: No results for input(s): CKTOTAL, CKMB, CKMBINDEX, TROPONINI in the last 168 hours. BNP (last 3 results) No results for input(s): PROBNP in the last 8760 hours. HbA1C: No results for input(s): HGBA1C in the last 72 hours. CBG: Recent Labs  Lab 07/01/20 1629 07/01/20 2108 07/02/20 0141 07/02/20 0737 07/02/20 1207  GLUCAP 214* 97 52* 208* 330*   Lipid Profile: No results for input(s): CHOL, HDL, LDLCALC, TRIG, CHOLHDL, LDLDIRECT in the last 72 hours. Thyroid Function Tests: No results for input(s): TSH, T4TOTAL, FREET4, T3FREE, THYROIDAB in the last 72 hours. Anemia Panel: No results for input(s): VITAMINB12, FOLATE, FERRITIN, TIBC, IRON, RETICCTPCT in the last 72 hours. Urine analysis:     Component Value Date/Time   COLORURINE YELLOW 06/30/2020 1528   APPEARANCEUR CLEAR 07/05/2020 1528   LABSPEC 1.027 06/21/2020 1528   PHURINE 5.0 07/06/2020 1528   GLUCOSEU >=500 (A) 07/04/2020 1528   HGBUR NEGATIVE 07/01/2020 1528   BILIRUBINUR NEGATIVE 07/02/2020 1528   KETONESUR 20 (A) 06/18/2020 1528   PROTEINUR >=300 (A) 06/24/2020 1528   NITRITE NEGATIVE 06/25/2020 1528   LEUKOCYTESUR NEGATIVE 07/09/2020 1528   Sepsis Labs: Invalid input(s): PROCALCITONIN, LACTICIDVEN  Recent Results (from the past 240 hour(s))  Blood Culture (routine x 2)     Status: None   Collection Time: 06/26/2020  3:39 PM   Specimen: BLOOD  Result Value Ref Range Status   Specimen Description   Final    BLOOD RIGHT WRIST Performed at [facility] [address] [place], [place] [address]    Special Requests   Final    BOTTLES DRAWN AEROBIC AND ANAEROBIC Blood Culture results may not be optimal due to an inadequate volume of blood received in culture bottles Performed at [facility] [address] [place], [place] [address]    Culture   Final    NO GROWTH 5 DAYS Performed at [facility], [place] [address] [place], [place] [address]    Report Status 06/28/2020 FINAL  Final  Blood Culture (routine x 2)     Status: None   Collection Time: 06/24/2020  3:39 PM   Specimen: BLOOD  Result Value Ref Range Status   Specimen Description   Final    BLOOD RIGHT ARM Performed at [facility] [address], [place], [place] [address]    Special Requests   Final    BOTTLES DRAWN AEROBIC AND ANAEROBIC Blood Culture adequate volume Performed at [facility] [address], [place], [place] [address]    Culture   Final    NO GROWTH 5 DAYS Performed at [facility], [place] [address], [place], [place] [address]    Report Status 06/28/2020 FINAL  Final      Radiology Studies: DG CHEST PORT 1 VIEW  Result Date:  07/01/2020 CLINICAL DATA:  Hypoxia and COVID-19 pneumonia. EXAM: PORTABLE CHEST 1 VIEW COMPARISON:  06/20/2020 FINDINGS: Stable heart size. Increased density  of confluent airspace disease in the right mid and lower lung zones. Airspace disease of the left lower lung appears relatively stable by chest x-ray. No pneumothorax or visible pleural fluid. IMPRESSION: Increased density of confluent airspace disease in the right mid and lower lung zones. Stable left lower lung airspace disease. Electronically Signed   By: Aletta Edouard M.D.   On: 07/01/2020 09:03    Phillips Climes MD [facility]

## 2020-07-03 LAB — GLUCOSE, CAPILLARY
Glucose-Capillary: 411 mg/dL — ABNORMAL HIGH (ref 70–99)
Glucose-Capillary: 366 mg/dL — ABNORMAL HIGH (ref 70–99)
Glucose-Capillary: 157 mg/dL — ABNORMAL HIGH (ref 70–99)
Glucose-Capillary: 124 mg/dL — ABNORMAL HIGH (ref 70–99)
Glucose-Capillary: 62 mg/dL — ABNORMAL LOW (ref 70–99)

## 2020-07-03 LAB — CBC
HCT: 39.2 % (ref 39.0–52.0)
Hemoglobin: 12.3 g/dL — ABNORMAL LOW (ref 13.0–17.0)
MCH: 26.3 pg (ref 26.0–34.0)
MCHC: 31.4 g/dL (ref 30.0–36.0)
MCV: 83.8 fL (ref 80.0–100.0)
Platelets: 129 10*3/uL — ABNORMAL LOW (ref 150–400)
RBC: 4.68 MIL/uL (ref 4.22–5.81)
RDW: 15 % (ref 11.5–15.5)
WBC: 10.7 10*3/uL — ABNORMAL HIGH (ref 4.0–10.5)
nRBC: 0 % (ref 0.0–0.2)

## 2020-07-03 LAB — COMPREHENSIVE METABOLIC PANEL
ALT: 29 U/L (ref 0–44)
AST: 26 U/L (ref 15–41)
Albumin: 2.2 g/dL — ABNORMAL LOW (ref 3.5–5.0)
Alkaline Phosphatase: 79 U/L (ref 38–126)
Anion gap: 10 (ref 5–15)
BUN: 33 mg/dL — ABNORMAL HIGH (ref 6–20)
CO2: 21 mmol/L — ABNORMAL LOW (ref 22–32)
Calcium: 7.5 mg/dL — ABNORMAL LOW (ref 8.9–10.3)
Chloride: 102 mmol/L (ref 98–111)
Creatinine, Ser: 1.05 mg/dL (ref 0.61–1.24)
GFR, Estimated: >60 mL/min (ref >60)
Glucose, Bld: 417 mg/dL — ABNORMAL HIGH (ref 70–99)
Potassium: 4.8 mmol/L (ref 3.5–5.1)
Sodium: 133 mmol/L — ABNORMAL LOW (ref 135–145)
Total Bilirubin: 0.9 mg/dL (ref 0.3–1.2)
Total Protein: 4.9 g/dL — ABNORMAL LOW (ref 6.5–8.1)

## 2020-07-03 MED ORDER — METHYLPREDNISOLONE SODIUM SUCC 40 MG IJ SOLR
40.0000 mg | Freq: Two times a day (BID) | INTRAMUSCULAR | Status: DC
  Administered 2020-07-03 – 2020-07-07 (×9): 40 mg via INTRAVENOUS
  Filled 2020-07-03 (×10): qty 1

## 2020-07-03 MED ORDER — HYDRALAZINE HCL 50 MG PO TABS
100.0000 mg | ORAL_TABLET | Freq: Three times a day (TID) | ORAL | Status: DC
  Administered 2020-07-03 – 2020-07-06 (×11): 100 mg via ORAL
  Filled 2020-07-03 (×11): qty 2

## 2020-07-03 NOTE — Progress Notes (Signed)
AM CBG 411. MD notified. Patient refusing morning novolog and states daughter will bring [name] today for his insulin.

## 2020-07-03 NOTE — Progress Notes (Signed)
CBG 62, patient given apple juice CBG within normal range 124. Will continue to monitor the patient.

## 2020-07-03 NOTE — Progress Notes (Signed)
Inpatient Diabetes Program Recommendations  AACE/ADA: New Consensus Statement on Inpatient Glycemic Control (2015)  Target Ranges:  Prepandial:   less than 140 mg/dL      Peak postprandial:   less than 180 mg/dL (1-2 hours)      Critically ill patients:  140 - 180 mg/dL   Lab Results  Component Value Date   GLUCAP 411 (H) 07/03/2020   HGBA1C 8.6 (H) 06/24/2020    Review of Glycemic Control Results for Craig Fisher, Craig Fisher (MRN [id number]) as of 07/03/2020 11:24  Ref. Range 07/02/2020 12:07 07/02/2020 16:25 07/02/2020 21:25 07/03/2020 08:06  Glucose-Capillary Latest Ref Range: 70 - 99 mg/dL 330 (H) 405 (H) 392 (H) 411 (H)   Home DM Meds:Tresiba 13 units Daily Humalog 0-20 units TID per SSI  Current Orders:Lantus 4 units QHS, Levemir 12 units x 1 Novolog Moderate Correction Scale/ SSI (0-15 units) TID AC + HS Tradjenta 5 mg Daily       Solumedrol 60 mg BID  REFUSED all doses of Novolog yesterday (12/15) and Tradjenta despite education by RN.   Inpatient Diabetes Program Recommendations:    Glucose trends continue to remain elevated. Patient refused Levemir 12 units on 12/15 and doses of Novolog correction. Patient's family planning to bring home medications of Tresiba.  Spoke with RN to discuss patient refusal of insulin, current glucose trends and plan for Craig Fisher and Craig Fisher. Per RN, patient will take insulin occasionally despite attempts for further education. Patient concerned for low blood sugars. Discussed that doses were even reduced to help prevent lows, would encourage current orders since blood glucose >400 mg/dL. Advised against the use of home Craig Fisher due to hospital policy. Following.   Thanks, Bronson Curb, MSN, RNC-OB Diabetes Coordinator [phone] (8a-5p)

## 2020-07-03 NOTE — Progress Notes (Signed)
PROGRESS NOTE  Craig Fisher EXB:[id number] DOB: 1961/04/23 DOA: 07/13/2020 PCP: Sharion Balloon, FNP   LOS: 10 days   Brief Narrative / Interim history: 59 year old male with ESRD due to uncontrolled diabetes mellitus status post renal transplantation in 2019, CKD 3A, IDDM, HTN, diagnosed with COVID-19 on 12/3 came into the hospital with shortness of breath and hypoxia.  He has been having abdominal discomfort, nausea, vomiting and diarrhea for the past week.  Has felt weak, worsening fatigue and malaised.  He was supposed to get monoclonal antibodies on 12/6 but he was hypoxic and was admitted to the hospital.  His renal transplant team has been contacted and advised holding the Myfortic but continuing Prograf.  Subjective / 24h Interval events:  Patient denies any significant events overnight, no new complaints, but he did refuse insulin overnight out of fear of hypoglycemia.  Assessment & Plan:   Acute Hypoxic Respiratory Failure due to Covid-19 Viral Illness -Patient is vaccinated however his immunosuppressed given renal transplant and immunosuppressives at home. -Remains with significant oxygen requirement, he is on 15 L heated high flow nasal cannula and 15 L NRB. -treated with Remdesivir. -Is treated with IV steroids as well, on prolonged course, I will start tapering gradually, will decrease to 40 mg twice daily today. -Status post 5 days of antibiotics for bacterial pneumonia. -I have discussed about baricitinib with the patient and daughter, explained risks and benefits, patient with no history of diverticulitis, bowel perforation, viral hepatitis or tuberculosis, they understand discussed and benefits, they are willing to proceed, patient is started on baricitinib, he declined to receive it yesterday as he thought it is related to diabetes medicine regimen, I have clarified this with him this morning, with daughter on WhatsApp as well, both are agreeable to proceed, so he will receive  baricitinib first dose this morning. -Due to elevation in the D-dimer patient was placed on empiric anticoagulation with Lovenox, D-dimer has since trending down.  Lower extremity negative for DVT.  Would like to avoid IV contrast due to renal transplant status.  A VQ scan was attempted however patient is on too much oxygen and unable to lay flat, perhaps could be obtained in the next few days -I&O on the positive side, Lasix as needed, monitor renal function closely. -CT chest obtained 12/15 for evaluation of his COVID-19 showing diffuse severe COVID-19 disease in both lungs. COVID-19 Labs  Recent Labs    07/01/20 0350 07/02/20 0405  DDIMER 3.32* 3.19*  CRP 3.0* 1.7*    Active Problems Renal transplant recipient, history of ESRD -Creatinine remains stable today. -Continue tacrolimus, hold mycophenolate, family has been in contact with the transplant team and this is their recommendation  Hypertension -Continue Norvasc, Coreg, lisinopril, clonidine, blood pressure is elevated, will increase hydralazine 200 mg 3 times daily.  Abdominal pain, nausea, vomiting -Abdominal pain seems to be better.  CT scan on admission unremarkable for acute findings.  Due to Covid, improved  Hyperlipidemia -Continue statin  Hyponatremia -Sodium normalized with fluids  Insulin-dependent diabetes mellitus with hyper and hypoglycemia -Poorly controlled, now with steroid-induced hyperglycemia, A1c 8.9.  -Are extremely brittle, with great variations. -Patient is extremely anxious and worried about hypoglycemia, he has been refusing his insulin regimen, and actually had couple of sugary drink next to him to avoid hypoglycemia which she has been drinking, despite elevated blood sugar, I have discussed with him at length, he would like to have his on fingerstick machine from home which I told him that fine, for  now we will continue with current regimen of NovoLog without evening time dose and current dose of  Lantus and Tradjenta.  CBG (last 3)  Recent Labs    07/02/20 2125 07/03/20 0806 07/03/20 1134  GLUCAP 392* 411* 366*    Scheduled Meds: . amLODipine  10 mg Oral Daily  . atorvastatin  20 mg Oral Daily  . baricitinib  4 mg Oral Daily  . carvedilol  12.5 mg Oral BID WC  . cloNIDine  0.1 mg Oral BID  . enoxaparin (LOVENOX) injection  1 mg/kg Subcutaneous Q12H  . hydrALAZINE  100 mg Oral TID  . insulin aspart  0-15 Units Subcutaneous TID WC  . insulin glargine  4 Units Subcutaneous Daily  . linagliptin  5 mg Oral Daily  . lisinopril  2.5 mg Oral Daily  . mouth rinse  15 mL Mouth Rinse BID  . methylPREDNISolone (SOLU-MEDROL) injection  40 mg Intravenous Q12H  . pantoprazole  20 mg Oral BID  . sodium chloride flush  3 mL Intravenous Q12H  . tacrolimus  1 mg Oral Daily   And  . tacrolimus  0.5 mg Oral QHS  . tamsulosin  0.4 mg Oral Daily   Continuous Infusions:  PRN Meds:.acetaminophen, baclofen, ondansetron **OR** ondansetron (ZOFRAN) IV, oxyCODONE, senna, sodium chloride  DVT prophylaxis: Lovenox Code Status: Full code Family Communication: Discussed with patient in detail.  Daughter has been updated by phone yesterday, and she has been updated by FaceTime upon patient request today. Status is: Inpatient  Remains inpatient appropriate because:Inpatient level of care appropriate due to severity of illness  Dispo: The patient is from: Home              Anticipated d/c is to: Home              Anticipated d/c date is: > 3 days              Patient currently is not medically stable to d/c.  Consultants:  None   Procedures:  None   Microbiology: None   Antibacterials: Ceftriaxone / Azithromycin    Objective: Vitals:   07/02/20 1356 07/02/20 2038 07/03/20 0445 07/03/20 0500  BP: (!) 153/75 (!) 158/81 (!) 160/70   Pulse: 67 76 69   Resp: 20 18 18    Temp: 98.3 F (36.8 C) 97.9 F (36.6 C) (!) 97.4 F (36.3 C)   TempSrc: Oral  Oral   SpO2: (!) 87% 94% 93%    Weight:    56.2 kg  Height:        Intake/Output Summary (Last 24 hours) at 07/03/2020 1428 Last data filed at 07/03/2020 1301 Gross per 24 hour  Intake 1188 ml  Output --  Net 1188 ml   Filed Weights   06/28/2020 1437 07/03/20 0500  Weight: 64 kg 56.2 kg    Examination:  Awake Alert, Oriented X 3, No new F.N deficits, Normal affect Symmetrical Chest wall movement, Good air movement bilaterally, scattered rhonchi RRR,No Gallops,Rubs or new Murmurs, No Parasternal Heave +ve B.Sounds, Abd Soft, No tenderness, No rebound - guarding or rigidity. No Cyanosis, Clubbing or edema, No new Rash or bruise    Data Reviewed: I have independently reviewed following labs and imaging studies   CBC: Recent Labs  Lab 06/27/20 0430 06/28/20 0453 06/29/20 0414 06/30/20 0351 07/01/20 0350 07/02/20 0405 07/03/20 0410  WBC 9.3 10.3 16.2* 13.3* 11.3* 16.6* 10.7*  NEUTROABS 8.7* 9.6*  --   --   --   --   --  HGB 12.7* 12.4* 12.7* 12.2* 12.4* 12.3* 12.3*  HCT 38.7* 39.6 39.2 37.7* 38.4* 38.8* 39.2  MCV 81.1 84.1 81.8 82.1 81.9 82.0 83.8  PLT 184 170 149* 145* 151 152 409*   Basic Metabolic Panel: Recent Labs  Lab 06/29/20 0414 06/30/20 0351 07/01/20 0350 07/02/20 0405 07/03/20 0410  NA 133* 132* 135 137 133*  K 5.2* 4.7 5.0 4.5 4.8  CL 102 102 104 103 102  CO2 21* 20* 19* 23 21*  GLUCOSE 187* 325* 319* 167* 417*  BUN 35* 35* 33* 35* 33*  CREATININE 1.13 1.13  1.11 1.09 1.09 1.05  CALCIUM 7.7* 7.5* 7.7* 7.8* 7.5*  MG 2.6*  --   --   --   --    GFR: Estimated Creatinine Clearance: 58.5 mL/min (by C-G formula based on SCr of 1.05 mg/dL). Liver Function Tests: Recent Labs  Lab 06/29/20 0414 06/30/20 0351 07/01/20 0350 07/02/20 0405 07/03/20 0410  AST 43* 30 25 32 26  ALT 31 29 26 25 29   ALKPHOS 93 80 85 77 79  BILITOT 0.6 0.6 1.0 0.6 0.9  PROT 5.1* 4.9* 5.2* 5.1* 4.9*  ALBUMIN 2.3* 2.3* 2.4* 2.3* 2.2*   No results for input(s): LIPASE, AMYLASE in the last 168  hours. No results for input(s): AMMONIA in the last 168 hours. Coagulation Profile: No results for input(s): INR, PROTIME in the last 168 hours. Cardiac Enzymes: No results for input(s): CKTOTAL, CKMB, CKMBINDEX, TROPONINI in the last 168 hours. BNP (last 3 results) No results for input(s): PROBNP in the last 8760 hours. HbA1C: No results for input(s): HGBA1C in the last 72 hours. CBG: Recent Labs  Lab 07/02/20 1207 07/02/20 1625 07/02/20 2125 07/03/20 0806 07/03/20 1134  GLUCAP 330* 405* 392* 411* 366*   Lipid Profile: No results for input(s): CHOL, HDL, LDLCALC, TRIG, CHOLHDL, LDLDIRECT in the last 72 hours. Thyroid Function Tests: No results for input(s): TSH, T4TOTAL, FREET4, T3FREE, THYROIDAB in the last 72 hours. Anemia Panel: No results for input(s): VITAMINB12, FOLATE, FERRITIN, TIBC, IRON, RETICCTPCT in the last 72 hours. Urine analysis:    Component Value Date/Time   COLORURINE YELLOW 07/14/2020 1528   APPEARANCEUR CLEAR 07/13/2020 1528   LABSPEC 1.027 07/07/2020 1528   PHURINE 5.0 07/09/2020 1528   GLUCOSEU >=500 (A) 07/16/2020 1528   HGBUR NEGATIVE 07/03/2020 1528   BILIRUBINUR NEGATIVE 07/01/2020 1528   KETONESUR 20 (A) 07/16/2020 1528   PROTEINUR >=300 (A) 07/15/2020 1528   NITRITE NEGATIVE 06/21/2020 1528   LEUKOCYTESUR NEGATIVE 06/22/2020 1528   Sepsis Labs: Invalid input(s): PROCALCITONIN, LACTICIDVEN  Recent Results (from the past 240 hour(s))  Blood Culture (routine x 2)     Status: None   Collection Time: 06/29/2020  3:39 PM   Specimen: BLOOD  Result Value Ref Range Status   Specimen Description   Final    BLOOD RIGHT WRIST Performed at [facility] [address], [place], [place] [address]    Special Requests   Final    BOTTLES DRAWN AEROBIC AND ANAEROBIC Blood Culture results may not be optimal due to an inadequate volume of blood received in culture bottles Performed at [facility] [address] [place], [place] [address]    Culture   Final    NO GROWTH 5 DAYS Performed at [facility], [place] [address] [place], [place] [address]    Report Status 06/28/2020 FINAL  Final  Blood Culture (routine x 2)     Status: None  Collection Time: 07/06/2020  3:39 PM   Specimen: BLOOD  Result Value Ref Range Status   Specimen Description   Final    BLOOD RIGHT ARM Performed at [facility] [address], [place], [place] [address]    Special Requests   Final    BOTTLES DRAWN AEROBIC AND ANAEROBIC Blood Culture adequate volume Performed at [facility] [address], [place], [place] [address]    Culture   Final    NO GROWTH 5 DAYS Performed at [facility], [place] [address] [place],  [address]    Report Status 06/28/2020 FINAL  Final      Radiology Studies: CT CHEST WO CONTRAST  Result Date: 07/02/2020 CLINICAL DATA:  Respiratory failure, COVID-19 pneumonia, hypoxia EXAM: CT CHEST WITHOUT CONTRAST TECHNIQUE: Multidetector CT imaging of the chest was performed following the standard protocol without IV contrast. COMPARISON:  12/09/2016 FINDINGS: Cardiovascular: Moderate multi-vessel coronary artery calcification, progressive since prior examination. Global cardiac size within normal limits. No pericardial effusion. Central pulmonary arteries are of normal caliber. Mild atherosclerotic calcification within the thoracic aorta. No aortic aneurysm. Mediastinum/Nodes: Thyroid unremarkable. No pathologic thoracic adenopathy. Esophagus unremarkable. Lungs/Pleura: Extensive, diffuse, slightly asymmetric ground-glass pulmonary infiltrate and dependent pulmonary consolidation is identified in keeping with atypical infection or inflammation. Small right pleural effusion is present. No pneumothorax. No central obstructing mass. Upper Abdomen: The kidneys are atrophic in keeping with chronic renal failure. There is extensive arteriosclerosis  involving the visualized visceral vasculature. No acute abnormality. Musculoskeletal: No acute bone abnormality. IMPRESSION: Extensive multifocal pulmonary infiltrate and consolidation most in keeping with atypical infection in the acute setting and compatible with the given history of COVID-19 pneumonia. Small right pleural effusion, slightly unusual in the setting of COVID-19 pneumonia, but possibly related to superimposed end-stage renal disease. Moderate coronary artery calcification, progressive since prior examination. Aortic Atherosclerosis (ICD10-I70.0). Electronically Signed   By: Fidela Salisbury MD   On: 07/02/2020 19:30    Phillips Climes MD [facility]

## 2020-07-04 LAB — COMPREHENSIVE METABOLIC PANEL
ALT: 32 U/L (ref 0–44)
AST: 32 U/L (ref 15–41)
Albumin: 2.3 g/dL — ABNORMAL LOW (ref 3.5–5.0)
Alkaline Phosphatase: 80 U/L (ref 38–126)
Anion gap: 10 (ref 5–15)
BUN: 34 mg/dL — ABNORMAL HIGH (ref 6–20)
CO2: 23 mmol/L (ref 22–32)
Calcium: 7.9 mg/dL — ABNORMAL LOW (ref 8.9–10.3)
Chloride: 103 mmol/L (ref 98–111)
Creatinine, Ser: 0.89 mg/dL (ref 0.61–1.24)
GFR, Estimated: >60 mL/min (ref >60)
Glucose, Bld: 182 mg/dL — ABNORMAL HIGH (ref 70–99)
Potassium: 5 mmol/L (ref 3.5–5.1)
Sodium: 136 mmol/L (ref 135–145)
Total Bilirubin: 0.7 mg/dL (ref 0.3–1.2)
Total Protein: 5.1 g/dL — ABNORMAL LOW (ref 6.5–8.1)

## 2020-07-04 LAB — CBC
HCT: 40.5 % (ref 39.0–52.0)
Hemoglobin: 12.9 g/dL — ABNORMAL LOW (ref 13.0–17.0)
MCH: 26.4 pg (ref 26.0–34.0)
MCHC: 31.9 g/dL (ref 30.0–36.0)
MCV: 82.8 fL (ref 80.0–100.0)
Platelets: 132 10*3/uL — ABNORMAL LOW (ref 150–400)
RBC: 4.89 MIL/uL (ref 4.22–5.81)
RDW: 14.8 % (ref 11.5–15.5)
WBC: 15.5 10*3/uL — ABNORMAL HIGH (ref 4.0–10.5)
nRBC: 0 % (ref 0.0–0.2)

## 2020-07-04 LAB — GLUCOSE, CAPILLARY
Glucose-Capillary: 181 mg/dL — ABNORMAL HIGH (ref 70–99)
Glucose-Capillary: 244 mg/dL — ABNORMAL HIGH (ref 70–99)
Glucose-Capillary: 286 mg/dL — ABNORMAL HIGH (ref 70–99)
Glucose-Capillary: 320 mg/dL — ABNORMAL HIGH (ref 70–99)
Glucose-Capillary: 326 mg/dL — ABNORMAL HIGH (ref 70–99)
Glucose-Capillary: 293 mg/dL — ABNORMAL HIGH (ref 70–99)

## 2020-07-04 LAB — D-DIMER, QUANTITATIVE: D-Dimer, Quant: 2.76 ug/mL-FEU — ABNORMAL HIGH (ref 0.00–0.50)

## 2020-07-04 MED ORDER — FUROSEMIDE 10 MG/ML IJ SOLN
40.0000 mg | Freq: Once | INTRAMUSCULAR | Status: AC
  Administered 2020-07-04: 15:00:00 40 mg via INTRAVENOUS
  Filled 2020-07-04: qty 4

## 2020-07-04 MED ORDER — VALACYCLOVIR HCL 500 MG PO TABS
500.0000 mg | ORAL_TABLET | Freq: Two times a day (BID) | ORAL | Status: DC
  Administered 2020-07-04 – 2020-07-08 (×10): 500 mg via ORAL
  Filled 2020-07-04 (×12): qty 1

## 2020-07-04 MED ORDER — MAGIC MOUTHWASH
10.0000 mL | Freq: Four times a day (QID) | ORAL | Status: DC
  Administered 2020-07-04 – 2020-07-07 (×14): 10 mL via ORAL
  Filled 2020-07-04 (×18): qty 10

## 2020-07-04 MED ORDER — INSULIN ASPART 100 UNIT/ML ~~LOC~~ SOLN
0.0000 [IU] | Freq: Three times a day (TID) | SUBCUTANEOUS | Status: DC
  Administered 2020-07-05 (×2): 7 [IU] via SUBCUTANEOUS
  Administered 2020-07-06 (×2): 2 [IU] via SUBCUTANEOUS
  Administered 2020-07-07 (×2): 1 [IU] via SUBCUTANEOUS

## 2020-07-04 MED ORDER — HYDROCORTISONE 1 % EX CREA
TOPICAL_CREAM | Freq: Three times a day (TID) | CUTANEOUS | Status: DC
  Filled 2020-07-04: qty 28

## 2020-07-04 MED ORDER — ENOXAPARIN SODIUM 60 MG/0.6ML ~~LOC~~ SOLN
60.0000 mg | Freq: Two times a day (BID) | SUBCUTANEOUS | Status: DC
  Administered 2020-07-05 – 2020-07-07 (×6): 60 mg via SUBCUTANEOUS
  Filled 2020-07-04 (×8): qty 0.6

## 2020-07-04 NOTE — Plan of Care (Signed)
  Problem: Education: Goal: Knowledge of risk factors and measures for prevention of condition will improve Outcome: Progressing   Problem: Coping: Goal: Psychosocial and spiritual needs will be supported Outcome: Progressing   Problem: Respiratory: Goal: Will maintain a patent airway Outcome: Progressing Goal: Complications related to the disease process, condition or treatment will be avoided or minimized Outcome: Progressing   Problem: Activity: Goal: Ability to tolerate increased activity will improve Outcome: Progressing   Problem: Respiratory: Goal: Ability to maintain a clear airway will improve Outcome: Progressing   Problem: Pain Managment: Goal: General experience of comfort will improve Outcome: Progressing   Problem: Skin Integrity: Goal: Risk for impaired skin integrity will decrease Outcome: Progressing   Problem: Education: Goal: Knowledge of General Education information will improve Description: Including pain rating scale, medication(s)/side effects and non-pharmacologic comfort measures Outcome: Progressing   Problem: Health Behavior/Discharge Planning: Goal: Ability to manage health-related needs will improve Outcome: Progressing   Problem: Clinical Measurements: Goal: Ability to maintain clinical measurements within normal limits will improve Outcome: Progressing Goal: Will remain free from infection Outcome: Progressing Goal: Diagnostic test results will improve Outcome: Progressing Goal: Respiratory complications will improve Outcome: Progressing Goal: Cardiovascular complication will be avoided Outcome: Progressing   Problem: Activity: Goal: Risk for activity intolerance will decrease Outcome: Progressing   Problem: Nutrition: Goal: Adequate nutrition will be maintained Outcome: Progressing   Problem: Coping: Goal: Level of anxiety will decrease Outcome: Progressing   Problem: Elimination: Goal: Will not experience complications  related to bowel motility Outcome: Progressing Goal: Will not experience complications related to urinary retention Outcome: Progressing   Problem: Pain Managment: Goal: General experience of comfort will improve Outcome: Progressing   Problem: Safety: Goal: Ability to remain free from injury will improve Outcome: Progressing   Problem: Skin Integrity: Goal: Risk for impaired skin integrity will decrease Outcome: Progressing

## 2020-07-04 NOTE — Progress Notes (Signed)
PROGRESS NOTE  Craig Fisher WUJ:[id number] DOB: 1961-04-24 DOA: 06/20/2020 PCP: Sharion Balloon, FNP   LOS: 11 days   Brief Narrative / Interim history:  59 year old male with ESRD due to uncontrolled diabetes mellitus status post renal transplantation in 2019, CKD 3A, IDDM, HTN, diagnosed with COVID-19 on 12/3 came into the hospital with shortness of breath and hypoxia.  He has been having abdominal discomfort, nausea, vomiting and diarrhea for the past week.  Has felt weak, worsening fatigue and malaised.  He was supposed to get monoclonal antibodies on 12/6 but he was hypoxic and was admitted to the hospital.  His renal transplant team has been contacted and advised holding the Myfortic but continuing Prograf.  Subjective / 24h Interval events:  Patient denies still reports dyspnea, cough, he does report some throat pain upon swallowing.  Assessment & Plan:   Acute Hypoxic Respiratory Failure due to Covid-19 Viral Illness -Patient is vaccinated however his immunosuppressed given renal transplant and immunosuppressives at home. -Remains with significant oxygen requirement, he is on 15 L heated high flow nasal cannula and 15 L NRB. -treated with Remdesivir. -Is treated with IV steroids as well, on prolonged course, I will start tapering gradually, will decrease to 40 mg twice daily , then will taper him back to his baseline of prednisone 5 mg oral daily(he is on chronic prednisone). -Status post 5 days of antibiotics for bacterial pneumonia. -I have discussed about baricitinib with the patient and daughter, explained risks and benefits, patient with no history of diverticulitis, bowel perforation, viral hepatitis or tuberculosis, they understand discussed and benefits, they are willing to proceed, started on baricitinib, first dose 12/16.  . -Due to elevation in the D-dimer patient was placed on empiric anticoagulation with Lovenox, D-dimer has since trending down.  Lower extremity negative  for DVT.  Would like to avoid IV contrast due to renal transplant status.  A VQ scan was attempted however patient is on too much oxygen and unable to lay flat, perhaps could be obtained in the next few days -I&O on the positive side, Lasix as needed, monitor renal function closely. -CT chest obtained 12/15 for evaluation of his COVID-19 showing diffuse severe COVID-19 disease in both lungs. -Patient with some oral thrush/ulcer, viral versus candidal, most likely due to chronic steroids, he will be started on Valtrex and Magic mouthwash.    COVID-19 Labs  Recent Labs    07/02/20 0405 07/04/20 0413  DDIMER 3.19* 2.76*  CRP 1.7*  --     Renal transplant recipient, history of ESRD -Creatinine remains stable today. -Continue tacrolimus, hold mycophenolate, family has been in contact with the transplant team and this is their recommendation  Hypertension -Continue Norvasc, Coreg, lisinopril, clonidine, Lopressor improved after increasing his hydralazine . -Calcium is 5 today, continue to monitor closely and DC lisinopril if continues to trend up .  Abdominal pain, nausea, vomiting -Abdominal pain seems to be better.  CT scan on admission unremarkable for acute findings.  Due to Covid, improved  Hyperlipidemia -Continue statin  Hyponatremia -Sodium normalized with fluids  Insulin-dependent diabetes mellitus with hyper and hypoglycemia -Poorly controlled, now with steroid-induced hyperglycemia, A1c 8.9.  -Are extremely brittle, with great variations. -Patient is extremely anxious and worried about hypoglycemia, have to allow high CBG reading in order to prevent hypoglycemia, continue with current regimen of Lantus 40 units, and sensitive sliding scale, . -Tradjenta been discontinued.  .  Patient has a right forehead mild rash, I will start him on hydrocortisone cream  for that.  CBG (last 3)  Recent Labs    07/04/20 0527 07/04/20 0742 07/04/20 1133  GLUCAP 181* 244* 286*     Scheduled Meds: . amLODipine  10 mg Oral Daily  . atorvastatin  20 mg Oral Daily  . baricitinib  4 mg Oral Daily  . carvedilol  12.5 mg Oral BID WC  . cloNIDine  0.1 mg Oral BID  . enoxaparin (LOVENOX) injection  1 mg/kg Subcutaneous Q12H  . furosemide  40 mg Intravenous Once  . hydrALAZINE  100 mg Oral TID  . insulin aspart  0-9 Units Subcutaneous TID WC  . insulin glargine  4 Units Subcutaneous Daily  . lisinopril  2.5 mg Oral Daily  . magic mouthwash  10 mL Oral QID  . mouth rinse  15 mL Mouth Rinse BID  . methylPREDNISolone (SOLU-MEDROL) injection  40 mg Intravenous Q12H  . pantoprazole  20 mg Oral BID  . sodium chloride flush  3 mL Intravenous Q12H  . tacrolimus  1 mg Oral Daily   And  . tacrolimus  0.5 mg Oral QHS  . tamsulosin  0.4 mg Oral Daily  . valACYclovir  500 mg Oral BID   Continuous Infusions:  PRN Meds:.acetaminophen, baclofen, ondansetron **OR** ondansetron (ZOFRAN) IV, oxyCODONE, senna, sodium chloride  DVT prophylaxis: Lovenox Code Status: Full code Family Communication: Daughter has been updated by phone daily.  Remains inpatient appropriate because:Inpatient level of care appropriate due to severity of illness  Dispo: The patient is from: Home              Anticipated d/c is to: Home              Anticipated d/c date is: > 3 days              Patient currently is not medically stable to d/c.  Consultants:  None   Procedures:  None   Microbiology: None   Antibacterials: Ceftriaxone / Azithromycin    Objective: Vitals:   07/03/20 1427 07/03/20 2041 07/04/20 0523 07/04/20 0557  BP: (!) 149/83 (!) 158/77 (!) 167/78 (!) 156/69  Pulse: 78 89 67   Resp: (!) 22 20 19    Temp: 98.6 F (37 C) 98.5 F (36.9 C) 97.7 F (36.5 C)   TempSrc: Oral Oral Oral   SpO2: 96% 97% 96%   Weight:   56.3 kg   Height:        Intake/Output Summary (Last 24 hours) at 07/04/2020 1330 Last data filed at 07/03/2020 1837 Gross per 24 hour  Intake 354 ml   Output --  Net 354 ml   Filed Weights   07/09/2020 1437 07/03/20 0500 07/04/20 0523  Weight: 64 kg 56.2 kg 56.3 kg    Examination:  Awake Alert, Oriented X 3, frail, No new F.N deficits, Normal affect Symmetrical Chest wall movement, Good air movement bilaterally, scattered rhonchi. RRR,No Gallops,Rubs or new Murmurs, No Parasternal Heave +ve B.Sounds, Abd Soft, No tenderness, No rebound - guarding or rigidity. No Cyanosis, Clubbing or edema, right forehead mild rash  Data Reviewed: I have independently reviewed following labs and imaging studies   CBC: Recent Labs  Lab 06/28/20 0453 06/29/20 0414 06/30/20 0351 07/01/20 0350 07/02/20 0405 07/03/20 0410 07/04/20 0413  WBC 10.3   < > 13.3* 11.3* 16.6* 10.7* 15.5*  NEUTROABS 9.6*  --   --   --   --   --   --   HGB 12.4*   < > 12.2* 12.4* 12.3*  12.3* 12.9*  HCT 39.6   < > 37.7* 38.4* 38.8* 39.2 40.5  MCV 84.1   < > 82.1 81.9 82.0 83.8 82.8  PLT 170   < > 145* 151 152 129* 132*   < > = values in this interval not displayed.   Basic Metabolic Panel: Recent Labs  Lab 06/29/20 0414 06/30/20 0351 07/01/20 0350 07/02/20 0405 07/03/20 0410 07/04/20 0413  NA 133* 132* 135 137 133* 136  K 5.2* 4.7 5.0 4.5 4.8 5.0  CL 102 102 104 103 102 103  CO2 21* 20* 19* 23 21* 23  GLUCOSE 187* 325* 319* 167* 417* 182*  BUN 35* 35* 33* 35* 33* 34*  CREATININE 1.13 1.13  1.11 1.09 1.09 1.05 0.89  CALCIUM 7.7* 7.5* 7.7* 7.8* 7.5* 7.9*  MG 2.6*  --   --   --   --   --    GFR: Estimated Creatinine Clearance: 69 mL/min (by C-G formula based on SCr of 0.89 mg/dL). Liver Function Tests: Recent Labs  Lab 06/30/20 0351 07/01/20 0350 07/02/20 0405 07/03/20 0410 07/04/20 0413  AST 30 25 32 26 32  ALT 29 26 25 29  32  ALKPHOS 80 85 77 79 80  BILITOT 0.6 1.0 0.6 0.9 0.7  PROT 4.9* 5.2* 5.1* 4.9* 5.1*  ALBUMIN 2.3* 2.4* 2.3* 2.2* 2.3*   No results for input(s): LIPASE, AMYLASE in the last 168 hours. No results for input(s): AMMONIA in  the last 168 hours. Coagulation Profile: No results for input(s): INR, PROTIME in the last 168 hours. Cardiac Enzymes: No results for input(s): CKTOTAL, CKMB, CKMBINDEX, TROPONINI in the last 168 hours. BNP (last 3 results) No results for input(s): PROBNP in the last 8760 hours. HbA1C: No results for input(s): HGBA1C in the last 72 hours. CBG: Recent Labs  Lab 07/03/20 2044 07/03/20 2114 07/04/20 0527 07/04/20 0742 07/04/20 1133  GLUCAP 62* 124* 181* 244* 286*   Lipid Profile: No results for input(s): CHOL, HDL, LDLCALC, TRIG, CHOLHDL, LDLDIRECT in the last 72 hours. Thyroid Function Tests: No results for input(s): TSH, T4TOTAL, FREET4, T3FREE, THYROIDAB in the last 72 hours. Anemia Panel: No results for input(s): VITAMINB12, FOLATE, FERRITIN, TIBC, IRON, RETICCTPCT in the last 72 hours. Urine analysis:    Component Value Date/Time   COLORURINE YELLOW 07/16/2020 1528   APPEARANCEUR CLEAR 07/09/2020 1528   LABSPEC 1.027 07/03/2020 1528   PHURINE 5.0 07/03/2020 1528   GLUCOSEU >=500 (A) 06/25/2020 1528   HGBUR NEGATIVE 06/22/2020 1528   BILIRUBINUR NEGATIVE 07/09/2020 1528   KETONESUR 20 (A) 07/12/2020 1528   PROTEINUR >=300 (A) 06/26/2020 1528   NITRITE NEGATIVE 06/28/2020 1528   LEUKOCYTESUR NEGATIVE 07/01/2020 1528   Sepsis Labs: Invalid input(s): PROCALCITONIN, LACTICIDVEN  No results found for this or any previous visit (from the past 240 hour(s)).    Radiology Studies: CT CHEST WO CONTRAST  Result Date: 07/02/2020 CLINICAL DATA:  Respiratory failure, COVID-19 pneumonia, hypoxia EXAM: CT CHEST WITHOUT CONTRAST TECHNIQUE: Multidetector CT imaging of the chest was performed following the standard protocol without IV contrast. COMPARISON:  12/09/2016 FINDINGS: Cardiovascular: Moderate multi-vessel coronary artery calcification, progressive since prior examination. Global cardiac size within normal limits. No pericardial effusion. Central pulmonary arteries are of  normal caliber. Mild atherosclerotic calcification within the thoracic aorta. No aortic aneurysm. Mediastinum/Nodes: Thyroid unremarkable. No pathologic thoracic adenopathy. Esophagus unremarkable. Lungs/Pleura: Extensive, diffuse, slightly asymmetric ground-glass pulmonary infiltrate and dependent pulmonary consolidation is identified in keeping with atypical infection or inflammation. Small right pleural effusion is  present. No pneumothorax. No central obstructing mass. Upper Abdomen: The kidneys are atrophic in keeping with chronic renal failure. There is extensive arteriosclerosis involving the visualized visceral vasculature. No acute abnormality. Musculoskeletal: No acute bone abnormality. IMPRESSION: Extensive multifocal pulmonary infiltrate and consolidation most in keeping with atypical infection in the acute setting and compatible with the given history of COVID-19 pneumonia. Small right pleural effusion, slightly unusual in the setting of COVID-19 pneumonia, but possibly related to superimposed end-stage renal disease. Moderate coronary artery calcification, progressive since prior examination. Aortic Atherosclerosis (ICD10-I70.0). Electronically Signed   By: Fidela Salisbury MD   On: 07/02/2020 19:30    Phillips Climes MD [facility]

## 2020-07-05 LAB — GLUCOSE, CAPILLARY
Glucose-Capillary: 221 mg/dL — ABNORMAL HIGH (ref 70–99)
Glucose-Capillary: 340 mg/dL — ABNORMAL HIGH (ref 70–99)
Glucose-Capillary: 342 mg/dL — ABNORMAL HIGH (ref 70–99)
Glucose-Capillary: 153 mg/dL — ABNORMAL HIGH (ref 70–99)

## 2020-07-05 NOTE — Progress Notes (Signed)
PROGRESS NOTE  Craig Fisher YCX:[id number] DOB: 08-31-1960 DOA: 07/03/2020 PCP: Sharion Balloon, FNP   LOS: 12 days   Brief Narrative / Interim history: 59 year old male with ESRD due to uncontrolled diabetes mellitus status post renal transplantation in 2019, CKD 3A, IDDM, HTN, diagnosed with COVID-19 on 12/3 came into the hospital with shortness of breath and hypoxia.  He has been having abdominal discomfort, nausea, vomiting and diarrhea for the past week.  Has felt weak, worsening fatigue and malaised.  He was supposed to get monoclonal antibodies on 12/6 but he was hypoxic and was admitted to the hospital.  His renal transplant team has been contacted and advised holding the Myfortic but continuing Prograf.  Subjective / 24h Interval events: No overnight events  States that he is doing well.  He denies any abdominal pain, no nausea or vomiting.  No longer has hiccups.  Breathing is about the same, gets a little bit short winded when he walks to the bathroom but tells me he recovers well.  Assessment & Plan:  Principal Problem Acute Hypoxic Respiratory Failure due to Covid-19 Viral Illness -Patient is vaccinated however his immunosuppressed given renal transplant and immunosuppressives at home. -Currently remains on 15 L, has not gotten any worse but not better either -completed Remdesivir, continue steroids, started on Baricitinib by Dr Roseanne Kaufman -Status post 5 days of antibiotics for bacterial pneumonia. -Due to elevation in the D-dimer patient was placed on empiric anticoagulation with Lovenox on 4/12.  D-dimer has since trending down.  Lower extremity negative for DVT.  Would like to avoid IV contrast due to renal transplant status.  A VQ scan was attempted however patient is on too much oxygen and unable to lay flat, perhaps could be obtained in the next few days -moved to 4th floor progressive  COVID-19 Labs  Recent Labs    07/04/20 0413  DDIMER 2.76*    Active  Problems Renal transplant recipient, history of ESRD -Cr stable, recheck in the morning -Continue tacrolimus, hold mycophenolate, family has been in contact with the transplant team and this is their recommendation  Hypertension -Continue Norvasc, Coreg, lisinopril, clonidine, hydralazine.  Blood pressure slightly on the high side this morning  Abdominal pain, nausea, vomiting -Resolved, tolerating a regular diet  Hyperlipidemia -Continue statin  Hyponatremia -Sodium normalized with fluids  Insulin-dependent diabetes mellitus with hyper and hypoglycemia -Poorly controlled, now with steroid-induced hyperglycemia, A1c 8.9.  -He has been placed on his home [name] which he is agreeable to take. -No further hypoglycemic episodes,  CBG (last 3)  Recent Labs    07/04/20 2144 07/05/20 0840 07/05/20 1229  GLUCAP 293* 221* 340*    Scheduled Meds: . amLODipine  10 mg Oral Daily  . atorvastatin  20 mg Oral Daily  . baricitinib  4 mg Oral Daily  . carvedilol  12.5 mg Oral BID WC  . cloNIDine  0.1 mg Oral BID  . enoxaparin (LOVENOX) injection  60 mg Subcutaneous Q12H  . hydrALAZINE  100 mg Oral TID  . hydrocortisone cream   Topical TID  . insulin aspart  0-9 Units Subcutaneous TID WC  . insulin glargine  4 Units Subcutaneous Daily  . lisinopril  2.5 mg Oral Daily  . magic mouthwash  10 mL Oral QID  . mouth rinse  15 mL Mouth Rinse BID  . methylPREDNISolone (SOLU-MEDROL) injection  40 mg Intravenous Q12H  . pantoprazole  20 mg Oral BID  . sodium chloride flush  3 mL Intravenous Q12H  .  tacrolimus  1 mg Oral Daily   And  . tacrolimus  0.5 mg Oral QHS  . tamsulosin  0.4 mg Oral Daily  . valACYclovir  500 mg Oral BID   Continuous Infusions:  PRN Meds:.acetaminophen, baclofen, ondansetron **OR** ondansetron (ZOFRAN) IV, oxyCODONE, senna, sodium chloride  DVT prophylaxis: Lovenox Code Status: Full code Family Communication: Daughter Chrissie Noa at [phone]  Status is:  Inpatient  Remains inpatient appropriate because:Inpatient level of care appropriate due to severity of illness  Dispo: The patient is from: Home              Anticipated d/c is to: Home              Anticipated d/c date is: > 3 days              Patient currently is not medically stable to d/c.  Consultants:  None   Procedures:  None   Microbiology: None   Antibacterials: Ceftriaxone / Azithromycin    Objective: Vitals:   07/04/20 2019 07/05/20 0015 07/05/20 0534 07/05/20 1242  BP: 138/70 (!) 144/63 (!) 160/74 (!) 148/72  Pulse: 70 66 70 68  Resp: 20 20 18  (!) 24  Temp: 97.8 F (36.6 C) 97.8 F (36.6 C) 97.7 F (36.5 C)   TempSrc: Oral Oral Oral   SpO2: (!) 88% 90% (!) 84% (!) 86%  Weight:   54.4 kg   Height:       No intake or output data in the 24 hours ending 07/05/20 1306 Filed Weights   07/03/20 0500 07/04/20 0523 07/05/20 0534  Weight: 56.2 kg 56.3 kg 54.4 kg    Examination: Constitutional: NAD, in bed Eyes: No icterus  ENMT: Moist mucous membranes Neck: normal, supple Respiratory: Bibasilar rhonchi, no wheezing, normal respiratory effort Cardiovascular: Regular rate and rhythm, no murmurs, no peripheral edema Abdomen: Soft, nontender, nondistended, positive bowel sounds Musculoskeletal: no clubbing / cyanosis.  Skin: No rashes seen Neurologic: No focal deficits   Data Reviewed: I have independently reviewed following labs and imaging studies   CBC: Recent Labs  Lab 06/30/20 0351 07/01/20 0350 07/02/20 0405 07/03/20 0410 07/04/20 0413  WBC 13.3* 11.3* 16.6* 10.7* 15.5*  HGB 12.2* 12.4* 12.3* 12.3* 12.9*  HCT 37.7* 38.4* 38.8* 39.2 40.5  MCV 82.1 81.9 82.0 83.8 82.8  PLT 145* 151 152 129* 885*   Basic Metabolic Panel: Recent Labs  Lab 06/29/20 0414 06/30/20 0351 07/01/20 0350 07/02/20 0405 07/03/20 0410 07/04/20 0413  NA 133* 132* 135 137 133* 136  K 5.2* 4.7 5.0 4.5 4.8 5.0  CL 102 102 104 103 102 103  CO2 21* 20* 19* 23 21* 23   GLUCOSE 187* 325* 319* 167* 417* 182*  BUN 35* 35* 33* 35* 33* 34*  CREATININE 1.13 1.13  1.11 1.09 1.09 1.05 0.89  CALCIUM 7.7* 7.5* 7.7* 7.8* 7.5* 7.9*  MG 2.6*  --   --   --   --   --    GFR: Estimated Creatinine Clearance: 68.8 mL/min (by C-G formula based on SCr of 0.89 mg/dL). Liver Function Tests: Recent Labs  Lab 06/30/20 0351 07/01/20 0350 07/02/20 0405 07/03/20 0410 07/04/20 0413  AST 30 25 32 26 32  ALT 29 26 25 29  32  ALKPHOS 80 85 77 79 80  BILITOT 0.6 1.0 0.6 0.9 0.7  PROT 4.9* 5.2* 5.1* 4.9* 5.1*  ALBUMIN 2.3* 2.4* 2.3* 2.2* 2.3*   No results for input(s): LIPASE, AMYLASE in the last 168 hours. No results  for input(s): AMMONIA in the last 168 hours. Coagulation Profile: No results for input(s): INR, PROTIME in the last 168 hours. Cardiac Enzymes: No results for input(s): CKTOTAL, CKMB, CKMBINDEX, TROPONINI in the last 168 hours. BNP (last 3 results) No results for input(s): PROBNP in the last 8760 hours. HbA1C: No results for input(s): HGBA1C in the last 72 hours. CBG: Recent Labs  Lab 07/04/20 1628 07/04/20 1825 07/04/20 2144 07/05/20 0840 07/05/20 1229  GLUCAP 320* 326* 293* 221* 340*   Lipid Profile: No results for input(s): CHOL, HDL, LDLCALC, TRIG, CHOLHDL, LDLDIRECT in the last 72 hours. Thyroid Function Tests: No results for input(s): TSH, T4TOTAL, FREET4, T3FREE, THYROIDAB in the last 72 hours. Anemia Panel: No results for input(s): VITAMINB12, FOLATE, FERRITIN, TIBC, IRON, RETICCTPCT in the last 72 hours. Urine analysis:    Component Value Date/Time   COLORURINE YELLOW 07/16/2020 1528   APPEARANCEUR CLEAR 07/14/2020 1528   LABSPEC 1.027 06/18/2020 1528   PHURINE 5.0 07/14/2020 1528   GLUCOSEU >=500 (A) 07/04/2020 1528   HGBUR NEGATIVE 06/28/2020 1528   BILIRUBINUR NEGATIVE 07/12/2020 1528   KETONESUR 20 (A) 06/25/2020 1528   PROTEINUR >=300 (A) 06/18/2020 1528   NITRITE NEGATIVE 06/22/2020 1528   LEUKOCYTESUR NEGATIVE 07/04/2020  1528   Sepsis Labs: Invalid input(s): PROCALCITONIN, LACTICIDVEN  No results found for this or any previous visit (from the past 240 hour(s)).    Radiology Studies: No results found.  Marzetta Board, MD, PhD [facility]  Between 7 am - 7 pm I am available, please contact me via Amion or Securechat  Between 7 pm - 7 am I am not available, please contact night coverage MD/APP via Amion

## 2020-07-05 NOTE — Progress Notes (Signed)
   07/04/20 2239  Charting Type  Charting Type Shift assessment  Pt with old graft to lue. Positive bruit, no thrill.  Restrictive armband in place.

## 2020-07-06 ENCOUNTER — Inpatient Hospital Stay (HOSPITAL_COMMUNITY): Payer: Medicare Other

## 2020-07-06 DIAGNOSIS — U071 COVID-19: Secondary | ICD-10-CM

## 2020-07-06 DIAGNOSIS — R0602 Shortness of breath: Secondary | ICD-10-CM

## 2020-07-06 LAB — PHOSPHORUS: Phosphorus: 2.5 mg/dL (ref 2.5–4.6)

## 2020-07-06 LAB — GLUCOSE, CAPILLARY
Glucose-Capillary: 159 mg/dL — ABNORMAL HIGH (ref 70–99)
Glucose-Capillary: 160 mg/dL — ABNORMAL HIGH (ref 70–99)
Glucose-Capillary: 170 mg/dL — ABNORMAL HIGH (ref 70–99)
Glucose-Capillary: 156 mg/dL — ABNORMAL HIGH (ref 70–99)
Glucose-Capillary: 114 mg/dL — ABNORMAL HIGH (ref 70–99)
Glucose-Capillary: 96 mg/dL (ref 70–99)

## 2020-07-06 LAB — COMPREHENSIVE METABOLIC PANEL
ALT: 35 U/L (ref 0–44)
AST: 32 U/L (ref 15–41)
Albumin: 2.2 g/dL — ABNORMAL LOW (ref 3.5–5.0)
Alkaline Phosphatase: 91 U/L (ref 38–126)
Anion gap: 10 (ref 5–15)
BUN: 35 mg/dL — ABNORMAL HIGH (ref 6–20)
CO2: 24 mmol/L (ref 22–32)
Calcium: 7.8 mg/dL — ABNORMAL LOW (ref 8.9–10.3)
Chloride: 102 mmol/L (ref 98–111)
Creatinine, Ser: 0.94 mg/dL (ref 0.61–1.24)
GFR, Estimated: >60 mL/min (ref >60)
Glucose, Bld: 148 mg/dL — ABNORMAL HIGH (ref 70–99)
Potassium: 5.3 mmol/L — ABNORMAL HIGH (ref 3.5–5.1)
Sodium: 136 mmol/L (ref 135–145)
Total Bilirubin: 0.6 mg/dL (ref 0.3–1.2)
Total Protein: 4.9 g/dL — ABNORMAL LOW (ref 6.5–8.1)

## 2020-07-06 LAB — SEDIMENTATION RATE: Sed Rate: 20 mm/hr — ABNORMAL HIGH (ref 0–16)

## 2020-07-06 LAB — CBC
HCT: 41.6 % (ref 39.0–52.0)
Hemoglobin: 13.1 g/dL (ref 13.0–17.0)
MCH: 26.4 pg (ref 26.0–34.0)
MCHC: 31.5 g/dL (ref 30.0–36.0)
MCV: 83.7 fL (ref 80.0–100.0)
Platelets: 128 10*3/uL — ABNORMAL LOW (ref 150–400)
RBC: 4.97 MIL/uL (ref 4.22–5.81)
RDW: 14.5 % (ref 11.5–15.5)
WBC: 18.2 10*3/uL — ABNORMAL HIGH (ref 4.0–10.5)
nRBC: 0 % (ref 0.0–0.2)

## 2020-07-06 LAB — MAGNESIUM: Magnesium: 2.2 mg/dL (ref 1.7–2.4)

## 2020-07-06 LAB — COMPREHENSIVE METABOLIC PANEL WITH GFR
ALT: 35 U/L (ref 0–44)
AST: 32 U/L (ref 15–41)
Albumin: 2.3 g/dL — ABNORMAL LOW (ref 3.5–5.0)
Alkaline Phosphatase: 92 U/L (ref 38–126)
Anion gap: 10 (ref 5–15)
BUN: 35 mg/dL — ABNORMAL HIGH (ref 6–20)
CO2: 24 mmol/L (ref 22–32)
Calcium: 7.8 mg/dL — ABNORMAL LOW (ref 8.9–10.3)
Chloride: 101 mmol/L (ref 98–111)
Creatinine, Ser: 0.93 mg/dL (ref 0.61–1.24)
GFR, Estimated: >60 mL/min
Glucose, Bld: 150 mg/dL — ABNORMAL HIGH (ref 70–99)
Potassium: 5.3 mmol/L — ABNORMAL HIGH (ref 3.5–5.1)
Sodium: 135 mmol/L (ref 135–145)
Total Bilirubin: 0.9 mg/dL (ref 0.3–1.2)
Total Protein: 4.9 g/dL — ABNORMAL LOW (ref 6.5–8.1)

## 2020-07-06 LAB — LACTIC ACID, PLASMA
Lactic Acid, Venous: 1.1 mmol/L (ref 0.5–1.9)
Lactic Acid, Venous: 1.3 mmol/L (ref 0.5–1.9)

## 2020-07-06 LAB — BLOOD GAS, ARTERIAL
Acid-Base Excess: 3.6 mmol/L — ABNORMAL HIGH (ref 0.0–2.0)
Bicarbonate: 27.3 mmol/L (ref 20.0–28.0)
Drawn by: 11249
FIO2: 21
O2 Saturation: 73.6 %
Patient temperature: 98.1
pCO2 arterial: 39.2 mmHg (ref 32.0–48.0)
pH, Arterial: 7.457 — ABNORMAL HIGH (ref 7.350–7.450)
pO2, Arterial: 40.2 mmHg — ABNORMAL LOW (ref 83.0–108.0)

## 2020-07-06 LAB — BRAIN NATRIURETIC PEPTIDE: B Natriuretic Peptide: 115.6 pg/mL — ABNORMAL HIGH (ref 0.0–100.0)

## 2020-07-06 LAB — PROCALCITONIN: Procalcitonin: <0.1 ng/mL

## 2020-07-06 LAB — MRSA PCR SCREENING: MRSA by PCR: NEGATIVE

## 2020-07-06 LAB — D-DIMER, QUANTITATIVE
D-Dimer, Quant: 3.25 ug/mL-FEU — ABNORMAL HIGH (ref 0.00–0.50)
D-Dimer, Quant: 3.79 ug/mL-FEU — ABNORMAL HIGH (ref 0.00–0.50)

## 2020-07-06 LAB — C-REACTIVE PROTEIN
CRP: 0.7 mg/dL (ref <1.0)
CRP: 0.7 mg/dL (ref <1.0)

## 2020-07-06 MED ORDER — SODIUM CHLORIDE 0.9 % IV SOLN: INTRAVENOUS | Status: DC

## 2020-07-06 MED ORDER — PANTOPRAZOLE SODIUM 40 MG IV SOLR
40.0000 mg | INTRAVENOUS | Status: DC
  Administered 2020-07-06 – 2020-07-08 (×3): 40 mg via INTRAVENOUS
  Filled 2020-07-06 (×3): qty 40

## 2020-07-06 MED ORDER — HYDRALAZINE HCL 20 MG/ML IJ SOLN
10.0000 mg | Freq: Four times a day (QID) | INTRAMUSCULAR | Status: DC | PRN
  Filled 2020-07-06: qty 1

## 2020-07-06 MED ORDER — VANCOMYCIN HCL 1500 MG/300ML IV SOLN
1500.0000 mg | Freq: Once | INTRAVENOUS | Status: AC
  Administered 2020-07-06: 13:00:00 1500 mg via INTRAVENOUS
  Filled 2020-07-06: qty 300

## 2020-07-06 MED ORDER — SODIUM CHLORIDE 0.9 % IV SOLN
2.0000 g | Freq: Three times a day (TID) | INTRAVENOUS | Status: DC
  Administered 2020-07-06 – 2020-07-08 (×8): 2 g via INTRAVENOUS
  Filled 2020-07-06 (×8): qty 2

## 2020-07-06 MED ORDER — VANCOMYCIN HCL 1250 MG/250ML IV SOLN: 1250.0000 mg | INTRAVENOUS | Status: DC

## 2020-07-06 MED ORDER — CHLORHEXIDINE GLUCONATE CLOTH 2 % EX PADS
6.0000 | MEDICATED_PAD | Freq: Every day | CUTANEOUS | Status: DC
  Administered 2020-07-06 – 2020-07-09 (×4): 6 via TOPICAL

## 2020-07-06 MED ORDER — VANCOMYCIN HCL 1250 MG/250ML IV SOLN
1250.0000 mg | INTRAVENOUS | Status: DC
  Filled 2020-07-06: qty 250

## 2020-07-06 NOTE — Progress Notes (Signed)
Pt O2 sats 77%. Went to pt room. Instructed pt to turn to left side for better aeration of lungs.  With turning pt O2 sats dropped to 65%.  Pt O2 remains at [place] with NRB 15 liters FiO2.  Pt is not in acute respiratory distress although work of breathing has increased slightly.  RT called for recommendations.

## 2020-07-06 NOTE — Progress Notes (Signed)
Pharmacy Antibiotic Note   Craig Fisher is a 59 y.o. male with a h/o renal transplant admitted on 07/02/2020 with acute respiratory failure due to COVID-19.  Pharmacy has been consulted for vancomycin and cefepime dosing for possible HAP. Patient did receive a course of azithromycin and ceftriaxone this admission.   Plan: Cefepime 2 g iv q 8 hours  Vancomycin 1500 mg iv once followed by 1250 mg iv q 24 hours Predicted AUC 516 using SCr 0.93  MRSA PCR ordered  F/U renal function, culture results, and clinical course. Levels if indicated  Height: 5\' 2"  (157.5 cm) Weight: 56.8 kg (125 lb 3.5 oz) IBW/kg (Calculated) : 54.6  Temp (24hrs), Avg:97.8 F (36.6 C), Min:97.4 F (36.3 C), Max:98.1 F (36.7 C)  Recent Labs  Lab 07/01/20 0350 07/02/20 0405 07/03/20 0410 07/04/20 0413 07/06/20 0350 07/06/20 0644  WBC 11.3* 16.6* 10.7* 15.5* 18.2*  --   CREATININE 1.09 1.09 1.05 0.89 0.93  0.94  --   LATICACIDVEN  --   --   --   --  1.1 1.3    Estimated Creatinine Clearance: 66 mL/min (by C-G formula based on SCr of 0.93 mg/dL).    No Known Allergies  Thank you for allowing pharmacy to be a part of this patient's care.  Ulice Dash D 07/06/2020 7:27 AM

## 2020-07-06 NOTE — Progress Notes (Signed)
PROGRESS NOTE  Craig Fisher GBT:[id number] DOB: 1960/08/27 DOA: 07/16/2020 PCP: Sharion Balloon, FNP   LOS: 13 days   Brief Narrative / Interim history: 59 year old male with ESRD due to uncontrolled diabetes mellitus status post renal transplantation in 2019, CKD 3A, IDDM, HTN, diagnosed with COVID-19 on 12/3 came into the hospital with shortness of breath and hypoxia.  He has been having abdominal discomfort, nausea, vomiting and diarrhea for the past week.  Has felt weak, worsening fatigue and malaised.  He was supposed to get monoclonal antibodies on 12/6 but he was hypoxic and was admitted to the hospital.  His renal transplant team has been contacted and advised holding the Myfortic but continuing Prograf.  Subjective / 24h Interval events: Respiratory status worsened overnight, on heated high flow this morning and requiring BiPAP. He tells me that his breathing is a little bit better after starting BiPAP. Denies any chest pain  Assessment & Plan:  Principal Problem Acute Hypoxic Respiratory Failure due to Covid-19 Viral Illness -Patient is vaccinated however his immunosuppressed given renal transplant and immunosuppressives at home. -Has been on 15 L up until last night, now requiring heated high flow and BiPAP. Will transfer to stepdown, have consulted pulmonology/critical care, discussed with Dr. Leonides Schanz, appreciate input -completed Remdesivir, continue steroids, started on Baricitinib by Dr Roseanne Kaufman -Status post 5 days of antibiotics for bacterial pneumonia, however with increased oxygen requirements and leukocytosis will start antibiotics again today -Due to elevation in the D-dimer patient was placed on empiric anticoagulation with Lovenox on 4/12.  D-dimer has since trending down.  Lower extremity negative for DVT.  Would like to avoid IV contrast due to renal transplant status.  A VQ scan was attempted however patient is on too much oxygen and unable to lay flat, perhaps could be  obtained in the next few days  COVID-19 Labs  Recent Labs    07/04/20 0413 07/06/20 0350  DDIMER 2.76* 3.25*  CRP  --  0.7    Active Problems Renal transplant recipient, history of ESRD -Kidney function has remained stable -Continue tacrolimus, hold mycophenolate, family has been in contact with the transplant team and this is their recommendation  Hypertension -Continue Norvasc, Coreg, lisinopril, clonidine, hydralazine.  Blood pressure slightly on the high side this morning but overall stable  Abdominal pain, nausea, vomiting -Resolved, tolerating a regular diet  Hyperlipidemia -Continue statin  Hyponatremia -Sodium normalized with fluids  Insulin-dependent diabetes mellitus with hyper and hypoglycemia -Poorly controlled, now with steroid-induced hyperglycemia, A1c 8.9.  -He has been placed on his home [name] which he is agreeable to take. -No further hypoglycemic episodes,  CBG (last 3)  Recent Labs    07/06/20 0427 07/06/20 0800 07/06/20 0907  GLUCAP 159* 160* 170*    Scheduled Meds: . amLODipine  10 mg Oral Daily  . atorvastatin  20 mg Oral Daily  . baricitinib  4 mg Oral Daily  . carvedilol  12.5 mg Oral BID WC  . Chlorhexidine Gluconate Cloth  6 each Topical Daily  . cloNIDine  0.1 mg Oral BID  . enoxaparin (LOVENOX) injection  60 mg Subcutaneous Q12H  . hydrALAZINE  100 mg Oral TID  . hydrocortisone cream   Topical TID  . insulin aspart  0-9 Units Subcutaneous TID WC  . insulin glargine  4 Units Subcutaneous Daily  . lisinopril  2.5 mg Oral Daily  . magic mouthwash  10 mL Oral QID  . mouth rinse  15 mL Mouth Rinse BID  . methylPREDNISolone (SOLU-MEDROL)  injection  40 mg Intravenous Q12H  . pantoprazole  20 mg Oral BID  . sodium chloride flush  3 mL Intravenous Q12H  . tacrolimus  1 mg Oral Daily   And  . tacrolimus  0.5 mg Oral QHS  . tamsulosin  0.4 mg Oral Daily  . valACYclovir  500 mg Oral BID   Continuous Infusions: . sodium chloride 10  mL/hr at 07/06/20 0440  . ceFEPime (MAXIPIME) IV    . [START ON 07/07/2020] vancomycin    . vancomycin     PRN Meds:.acetaminophen, baclofen, ondansetron **OR** ondansetron (ZOFRAN) IV, oxyCODONE, senna, sodium chloride  DVT prophylaxis: Lovenox Code Status: Full code Family Communication: Daughter Chrissie Noa at [phone]  Status is: Inpatient  Remains inpatient appropriate because:Inpatient level of care appropriate due to severity of illness  Dispo: The patient is from: Home              Anticipated d/c is to: Home              Anticipated d/c date is: > 3 days              Patient currently is not medically stable to d/c.  Consultants:  PCCM critical care  Procedures:  None   Microbiology: None   Antibacterials: Vanco/cefepime 12/19  Objective: Vitals:   07/06/20 0500 07/06/20 0747 07/06/20 0800 07/06/20 0900  BP:   (!) 157/71   Pulse:  68 65 66  Resp:  (!) 23 19 17   Temp:    97.6 F (36.4 C)  TempSrc:    Oral  SpO2:  (!) 89% (!) 86% 90%  Weight: 56.8 kg     Height:        Intake/Output Summary (Last 24 hours) at 07/06/2020 1001 Last data filed at 07/06/2020 0500 Gross per 24 hour  Intake 3.06 ml  Output --  Net 3.06 ml   Filed Weights   07/04/20 0523 07/05/20 0534 07/06/20 0500  Weight: 56.3 kg 54.4 kg 56.8 kg    Examination: Constitutional: In bed, BiPAP on, Eyes: No scleral icterus  ENMT: Dry mucous membranes Neck: normal, supple Respiratory: Breathing with a BiPAP, rhonchi throughout, no wheezing, increased respiratory effort Cardiovascular: Regular rate and rhythm, no murmurs, no edema Abdomen: Soft, nontender, nondistended, positive bowel sounds Musculoskeletal: no clubbing / cyanosis.  Skin: No rashes appreciated Neurologic: Nonfocal, equal strength   Data Reviewed: I have independently reviewed following labs and imaging studies   CBC: Recent Labs  Lab 07/01/20 0350 07/02/20 0405 07/03/20 0410 07/04/20 0413 07/06/20 0350   WBC 11.3* 16.6* 10.7* 15.5* 18.2*  HGB 12.4* 12.3* 12.3* 12.9* 13.1  HCT 38.4* 38.8* 39.2 40.5 41.6  MCV 81.9 82.0 83.8 82.8 83.7  PLT 151 152 129* 132* 315*   Basic Metabolic Panel: Recent Labs  Lab 07/01/20 0350 07/02/20 0405 07/03/20 0410 07/04/20 0413 07/06/20 0350  NA 135 137 133* 136 135  136  K 5.0 4.5 4.8 5.0 5.3*  5.3*  CL 104 103 102 103 101  102  CO2 19* 23 21* 23 24  24   GLUCOSE 319* 167* 417* 182* 150*  148*  BUN 33* 35* 33* 34* 35*  35*  CREATININE 1.09 1.09 1.05 0.89 0.93  0.94  CALCIUM 7.7* 7.8* 7.5* 7.9* 7.8*  7.8*  MG  --   --   --   --  2.2  PHOS  --   --   --   --  2.5   GFR: Estimated Creatinine Clearance:  66 mL/min (by C-G formula based on SCr of 0.93 mg/dL). Liver Function Tests: Recent Labs  Lab 07/01/20 0350 07/02/20 0405 07/03/20 0410 07/04/20 0413 07/06/20 0350  AST 25 32 26 32 32  32  ALT 26 25 29  32 35  35  ALKPHOS 85 77 79 80 92  91  BILITOT 1.0 0.6 0.9 0.7 0.9  0.6  PROT 5.2* 5.1* 4.9* 5.1* 4.9*  4.9*  ALBUMIN 2.4* 2.3* 2.2* 2.3* 2.3*  2.2*   No results for input(s): LIPASE, AMYLASE in the last 168 hours. No results for input(s): AMMONIA in the last 168 hours. Coagulation Profile: No results for input(s): INR, PROTIME in the last 168 hours. Cardiac Enzymes: No results for input(s): CKTOTAL, CKMB, CKMBINDEX, TROPONINI in the last 168 hours. BNP (last 3 results) No results for input(s): PROBNP in the last 8760 hours. HbA1C: No results for input(s): HGBA1C in the last 72 hours. CBG: Recent Labs  Lab 07/05/20 1729 07/05/20 2212 07/06/20 0427 07/06/20 0800 07/06/20 0907  GLUCAP 342* 153* 159* 160* 170*   Lipid Profile: No results for input(s): CHOL, HDL, LDLCALC, TRIG, CHOLHDL, LDLDIRECT in the last 72 hours. Thyroid Function Tests: No results for input(s): TSH, T4TOTAL, FREET4, T3FREE, THYROIDAB in the last 72 hours. Anemia Panel: No results for input(s): VITAMINB12, FOLATE, FERRITIN, TIBC, IRON, RETICCTPCT  in the last 72 hours. Urine analysis:    Component Value Date/Time   COLORURINE YELLOW 07/13/2020 1528   APPEARANCEUR CLEAR 06/25/2020 1528   LABSPEC 1.027 07/01/2020 1528   PHURINE 5.0 06/20/2020 1528   GLUCOSEU >=500 (A) 06/19/2020 1528   HGBUR NEGATIVE 07/06/2020 1528   BILIRUBINUR NEGATIVE 07/09/2020 1528   KETONESUR 20 (A) 07/03/2020 1528   PROTEINUR >=300 (A) 07/03/2020 1528   NITRITE NEGATIVE 06/19/2020 1528   LEUKOCYTESUR NEGATIVE 07/14/2020 1528   Sepsis Labs: Invalid input(s): PROCALCITONIN, LACTICIDVEN  No results found for this or any previous visit (from the past 240 hour(s)).    Radiology Studies: DG CHEST PORT 1 VIEW  Result Date: 07/06/2020 CLINICAL DATA:  Shortness of breath. Acute hypoxemic respiratory failure due to COVID 19. Renal transplant patient. EXAM: PORTABLE CHEST 1 VIEW COMPARISON:  07/01/2020 FINDINGS: Heart size is stable. Severe diffuse bilateral pulmonary airspace disease shows interval worsening mainly in the left lung since prior study. No evidence of pleural effusion. Subcutaneous emphysema is seen in the right chest wall, however no pneumothorax is visualized. IMPRESSION: Severe diffuse bilateral pulmonary airspace disease, with interval worsening mainly in the left lung. Right chest wall subcutaneous emphysema. No pneumothorax visualized. Electronically Signed   By: Marlaine Hind M.D.   On: 07/06/2020 04:32    Marzetta Board, MD, PhD [facility]  Between 7 am - 7 pm I am available, please contact me via Amion or Securechat  Between 7 pm - 7 am I am not available, please contact night coverage MD/APP via Amion

## 2020-07-06 NOTE — Progress Notes (Signed)
Amy RT at bedside.  Md has been paged by [facility] administrator.  No response as of yet.  Heated [place] initiated-50 liters @100 % FiO2. Awaiting md to return page.  Pt sats are at 78%.Pt with dyspnea at rest. Will continue to monitor.

## 2020-07-06 NOTE — Progress Notes (Signed)
The patient's nurse notified the CN that the patient's SATs were in the 67s.The patient's nurse asked for CN to notify the PCP that the patient needed an order for Heated High Flow oxygen.   Respiratory came to the  Patient's room and placed Heated High Flow oxygen on the patient as well as a Non-Rebreather mask. The Rapid Response Team was notified that the patient's SATs were continuing to be in the 12s.  CN in patient's room.

## 2020-07-06 NOTE — Progress Notes (Signed)
Rapid response nurse at bedside.  Discussed change in pt condition and proposed plan of care discussed.  ABG completed by RT.  Pt remains on HFNC at same settings (50 liters at 100% FiO2).  Pt remains with increased work of breathing ranging from 22-26 breaths per minute.  No circumoral cyanosis.  Capillary refill right at 4 seconds.  Pt is alert although fatigued. Will continue to monitor.

## 2020-07-06 NOTE — Progress Notes (Signed)
   07/06/20 0356  Oxygen Therapy  SpO2 (!) 79 %  O2 Device Bi-PAP  FiO2 (%) 100 %  Pulse Oximetry Type Continuous  ABG result.  Pt placed on bipap.  O2 sats 83%.  Slight ease of breathing noted.  Interpreter Estefani contacted and explained what is going on and the plan of care for him.  Pt is aware and is compliant with current treatment regimen.  Will continue to monitor.

## 2020-07-06 NOTE — Progress Notes (Addendum)
Patient placed on BIPAP due to sats in the 70s. Patient now satting in the high 80s. Patient tolerating BIPAP at this time.

## 2020-07-06 NOTE — Plan of Care (Signed)
  Problem: Education: Goal: Knowledge of risk factors and measures for prevention of condition will improve Outcome: Progressing   Problem: Coping: Goal: Psychosocial and spiritual needs will be supported Outcome: Progressing   Problem: Respiratory: Goal: Will maintain a patent airway Outcome: Progressing Goal: Complications related to the disease process, condition or treatment will be avoided or minimized Outcome: Progressing   Problem: Activity: Goal: Ability to tolerate increased activity will improve Outcome: Progressing   Problem: Respiratory: Goal: Ability to maintain a clear airway will improve Outcome: Progressing   Problem: Pain Managment: Goal: General experience of comfort will improve Outcome: Progressing   Problem: Skin Integrity: Goal: Risk for impaired skin integrity will decrease Outcome: Progressing   Problem: Education: Goal: Knowledge of General Education information will improve Description: Including pain rating scale, medication(s)/side effects and non-pharmacologic comfort measures Outcome: Progressing   Problem: Health Behavior/Discharge Planning: Goal: Ability to manage health-related needs will improve Outcome: Progressing   Problem: Clinical Measurements: Goal: Ability to maintain clinical measurements within normal limits will improve Outcome: Progressing Goal: Will remain free from infection Outcome: Progressing Goal: Diagnostic test results will improve Outcome: Progressing Goal: Respiratory complications will improve Outcome: Progressing Goal: Cardiovascular complication will be avoided Outcome: Progressing   Problem: Activity: Goal: Risk for activity intolerance will decrease Outcome: Progressing   Problem: Nutrition: Goal: Adequate nutrition will be maintained Outcome: Progressing   Problem: Coping: Goal: Level of anxiety will decrease Outcome: Progressing   Problem: Elimination: Goal: Will not experience complications  related to bowel motility Outcome: Progressing Goal: Will not experience complications related to urinary retention Outcome: Progressing   Problem: Pain Managment: Goal: General experience of comfort will improve Outcome: Progressing   Problem: Safety: Goal: Ability to remain free from injury will improve Outcome: Progressing   Problem: Skin Integrity: Goal: Risk for impaired skin integrity will decrease Outcome: Progressing

## 2020-07-06 NOTE — Consult Note (Addendum)
NAME:  Craig Fisher, MRN:  [id number], DOB:  April 23, 1961, LOS: 61 ADMISSION DATE:  07/09/2020, CONSULTATION DATE:  12/19 REFERRING MD:  [facility], CHIEF COMPLAINT:  Worsening resp failure    Brief History:  59 yo hispanic male never smoker vaccinated but no booster w/in 6 m with onset of symptoms 12/1 admit 12/6 with covid pna much worse since 12/18 so transferred to step down for bipap pm 12/19 and pccm consulted.   History of Present Illness:  59 y.o. male with medical history significant for ESRD secondary to uncontrolled diabetes mellitus status post renal transplantation in 2019, CKD 3a, insulin-dependent diabetes mellitus, hypertension, and COVID-19 diagnosis on 06/20/2020, now presenting to the emergency department from an outpatient clinic for evaluation of hypoxia.  Patient reports that he developed general malaise, aches, upper abdominal discomfort, nausea, vomiting, and loose stools on 06/18/2020.  Since then, he has continued to feel poor and has had worsening fatigue and malaise.  He was seen at an outside facility on 06/20/2020 where he was diagnosed with COVID-19, was stable for discharge from the ED at that time, and monoclonal antibody treatment was arranged for 12/6.  When he showed up for that treatment, he was found to be hypoxic with a saturation of 82% on room air and was sent to the ED.  Patient denied any chest pain, hemoptysis, leg swelling or tenderness, headache, change in vision or hearing, or focal numbness or weakness.  He had been in contact with his renal transplant team who are aware that he has been admitted to the hospital, agreed with admission to this facility, and advised holding the Myfortic and continuing Prograf.  ED Course: Upon arrival to the ED, patient is found to be febrile to 38.2 C, saturating mid 80s on room air, slightly tachypneic, and with blood pressure as high as 208/75.  Chest x-ray  notable for patchy airspace opacities, right greater than left.  CT the  abdomen and pelvis demonstrated fatty liver and normal-appearing renal transplant.  Chemistry panel notable for glucose 282, sodium 127, bicarbonate 19, and creatinine 1.18, down from 1.33 three days PTA.     D-dimer slightly elevated and procalcitonin 1.30.  Urinalysis with proteinuria and glucosuria as well as 20 ketones.  Lactic acid  normal.  Blood cultures were collected in the emergency department and the patient was treated with oral hydralazine, 5 units of NovoLog, acetaminophen, fentanyl, and Zofran.  He was started on 5 L/min of supplemental oxygen  Past Medical History:  Anemia, Blind left eye, Complication of anesthesia, Diabetes mellitus without complication ([place]), Diabetic retinopathy ([place]), ESRD (end stage renal disease) ([place]), Full dentures, Headache, Hyperlipidemia, Hypertension, Shortness of breath dyspnea, and Wears glasses.   Significant Hospital Events:  Transferred to SDU  12/19   Consults:  PCCM 12/19   Procedures:     Significant Diagnostic Tests:  CT w/o contrast 07/02/20   Micro Data:  BC x 2  12/6 neg MRSA PCR  12/19 neg   Antimicrobials/ covid 19 rx :  Baricitinib  12/16 >>> Valcyclovir 12/17 >>> Vancomycin  12/19 >> Maxepime  12/19 >>>  Interim History / Subjective:  More comfortable on bipap since arriving in unit   Objective   Blood pressure (!) 115/39, pulse 61, temperature 98.6 F (37 C), temperature source Oral, resp. rate 14, height 5' 2"  (1.575 m), weight 56.8 kg, SpO2 91 %.    FiO2 (%):  [100 %] 100 %   Intake/Output Summary (Last 24 hours) at 07/06/2020 1746  Last data filed at 07/06/2020 1653 Gross per 24 hour  Intake 563.65 ml  Output --  Net 563.65 ml   Filed Weights   07/04/20 0523 07/05/20 0534 07/06/20 0500  Weight: 56.3 kg 54.4 kg 56.8 kg    Examination: General: hispanic male comfortable on bipap tmax 98.6  Lungs: distant insp crackles bilaterall Cardiovascular: RRR no s3 Abdomen: soft/ benign Extremities: warm s  edema/ calf tenderness     I personally reviewed images and agree with radiology impression as follows:  CXR:   12/19 Severe diffuse bilateral pulmonary airspace disease, with interval worsening mainly in the left lung.  Right chest wall subcutaneous emphysema. No pneumothorax visualized.  Resolved Hospital Problem list      Assessment & Plan:   1) Acute resp failure secondary to covid pna/ better on bipap >>> continue bipap/ repeat esr/ bnp/pct to detect chf/ hcap respectively and agree with empirically covering for HCAP for now.   2) Atypical covid pna in immunocompromised host vaccinated but not boosted  With which we don't have much experience yet and not clear covid infection is even still present at this point almost 3 weeks from onset of symptoms.  >>> continue steroids and Baricitinib for now   3)  HBP   Would avoid norvasc and lisinopril in pt with acute worsening resp symptoms and instead just rec: >>>   treat with "titrated"  clonidine short term    Best practice (evaluated daily)  Diet: per [facility]ad Pain/Anxiety/Delirium protocol (if indicated): n/a VAP protocol (if indicated): n/a DVT prophylaxis: per [facility]ad GI prophylaxis: per [facility]ad Glucose control: per [facility] Mobility: ok for up in chair if tol Disposition:step down      Labs   CBC: Recent Labs  Lab 07/01/20 0350 07/02/20 0405 07/03/20 0410 07/04/20 0413 07/06/20 0350  WBC 11.3* 16.6* 10.7* 15.5* 18.2*  HGB 12.4* 12.3* 12.3* 12.9* 13.1  HCT 38.4* 38.8* 39.2 40.5 41.6  MCV 81.9 82.0 83.8 82.8 83.7  PLT 151 152 129* 132* 128*    Basic Metabolic Panel: Recent Labs  Lab 07/01/20 0350 07/02/20 0405 07/03/20 0410 07/04/20 0413 07/06/20 0350  NA 135 137 133* 136 135  136  K 5.0 4.5 4.8 5.0 5.3*  5.3*  CL 104 103 102 103 101  102  CO2 19* 23 21* 23 24  24   GLUCOSE 319* 167* 417* 182* 150*  148*  BUN 33* 35* 33* 34* 35*  35*  CREATININE 1.09 1.09 1.05 0.89 0.93  0.94  CALCIUM 7.7* 7.8* 7.5*  7.9* 7.8*  7.8*  MG  --   --   --   --  2.2  PHOS  --   --   --   --  2.5   GFR: Estimated Creatinine Clearance: 66 mL/min (by C-G formula based on SCr of 0.93 mg/dL). Recent Labs  Lab 07/02/20 0405 07/03/20 0410 07/04/20 0413 07/06/20 0350 07/06/20 0644  WBC 16.6* 10.7* 15.5* 18.2*  --   LATICACIDVEN  --   --   --  1.1 1.3    Liver Function Tests: Recent Labs  Lab 07/01/20 0350 07/02/20 0405 07/03/20 0410 07/04/20 0413 07/06/20 0350  AST 25 32 26 32 32  32  ALT 26 25 29  32 35  35  ALKPHOS 85 77 79 80 92  91  BILITOT 1.0 0.6 0.9 0.7 0.9  0.6  PROT 5.2* 5.1* 4.9* 5.1* 4.9*  4.9*  ALBUMIN 2.4* 2.3* 2.2* 2.3* 2.3*  2.2*   No results for  input(s): LIPASE, AMYLASE in the last 168 hours. No results for input(s): AMMONIA in the last 168 hours.  ABG    Component Value Date/Time   PHART 7.457 (H) 07/06/2020 0250   PCO2ART 39.2 07/06/2020 0250   PO2ART 40.2 (L) 07/06/2020 0250   HCO3 27.3 07/06/2020 0250   TCO2 20 03/08/2014 0656   O2SAT 73.6 07/06/2020 0250     Coagulation Profile: No results for input(s): INR, PROTIME in the last 168 hours.  Cardiac Enzymes: No results for input(s): CKTOTAL, CKMB, CKMBINDEX, TROPONINI in the last 168 hours.  HbA1C: HB A1C ([facility] DCA - WAIVED)  Date/Time Value Ref Range Status  10/08/2016 04:06 PM 6.7 <7.0 % Final    Comment:                                          Diabetic Adult            <7.0                                       Healthy Adult        4.3 - 5.7                                                           (DCCT/NGSP) American Diabetes Association's Summary of Glycemic Recommendations for Adults with Diabetes: Hemoglobin A1c <7.0%. More stringent glycemic goals (A1c <6.0%) may further reduce complications at the cost of increased risk of hypoglycemia.   03/15/2016 04:52 PM 7.1 (H) <7.0 % Final    Comment:                                          Diabetic Adult            <7.0                                        Healthy Adult        4.3 - 5.7                                                           (DCCT/NGSP) American Diabetes Association's Summary of Glycemic Recommendations for Adults with Diabetes: Hemoglobin A1c <7.0%. More stringent glycemic goals (A1c <6.0%) may further reduce complications at the cost of increased risk of hypoglycemia.    Hgb A1c MFr Bld  Date/Time Value Ref Range Status  06/24/2020 04:23 AM 8.6 (H) 4.8 - 5.6 % Final    Comment:    (NOTE) Pre diabetes:          5.7%-6.4%  Diabetes:              >6.4%  Glycemic control for   <7.0% adults with diabetes  07/02/2016 03:21 PM 6.6 (H) 4.8 - 5.6 % Final    Comment:    (NOTE)         Pre-diabetes: 5.7 - 6.4         Diabetes: >6.4         Glycemic control for adults with diabetes: <7.0     CBG: Recent Labs  Lab 07/05/20 2212 07/06/20 0427 07/06/20 0800 07/06/20 0907 07/06/20 1234  GLUCAP 153* 159* 160* 170* 156*      Past Medical History:  He,  has a past medical history of Anemia, Blind left eye, Complication of anesthesia, Diabetes mellitus without complication ([place]), Diabetic retinopathy ([place]), ESRD (end stage renal disease) ([place]), Full dentures, Headache, Hyperlipidemia, Hypertension, Shortness of breath dyspnea, and Wears glasses.   Surgical History:   Past Surgical History:  Procedure Laterality Date  . AV FISTULA PLACEMENT Left 10/29/2015   Procedure: BRACHIOCEPHALIC ARTERIOVENOUS (AV) FISTULA CREATION;  Surgeon: Mal Misty, MD;  Location: [facility];  Service: Vascular;  Laterality: Left;  . CAPD INSERTION N/A 07/07/2016   Procedure: LAPAROSCOPIC INSERTION CONTINUOUS AMBULATORY PERITONEAL DIALYSIS CATHETER WITH OMENTOPEXY;  Surgeon:  Boston, MD;  Location: [facility];  Service: General;  Laterality: N/A;  . COLONOSCOPY     One small polyp  . ESOPHAGOGASTRODUODENOSCOPY N/A 04/05/2017   Procedure: ESOPHAGOGASTRODUODENOSCOPY (EGD);  Surgeon: Rogene Houston, MD;  [facility];   Service: Endoscopy;  Laterality: N/A;  12:15  . EYE SURGERY     both eyes-retine  . eyes     detached retina-lt  . INGUINAL HERNIA REPAIR Bilateral 07/07/2016   Procedure: REPAIR BILATERAL INGUINAL HERNIAS;  Surgeon:  Boston, MD;  Location: [facility];  Service: General;  Laterality: Bilateral;  . INSERTION OF DIALYSIS CATHETER N/A 11/07/2015   Procedure: INSERTION OF DIALYSIS CATHETER;  Surgeon: Elam Dutch, MD;  Location: [facility];  Service: Vascular;  Laterality: N/A;  . INSERTION OF MESH Bilateral 07/07/2016   Procedure: INSERTION OF MESH;  Surgeon:  Boston, MD;  Location: [facility];  Service: General;  Laterality: Bilateral;  . IR GENERIC HISTORICAL  02/20/2016   IR REMOVAL TUN CV CATH W/O FL 02/20/2016 Corrie Mckusick, DO [facility]     Social History:   reports that he has never smoked. He has never used smokeless tobacco. He reports that he does not drink alcohol and does not use drugs.   Family History:  His family history includes Diabetes in his mother and sister. There is no history of Esophageal cancer, Colon cancer, Rectal cancer, or Stomach cancer.   Allergies No Known Allergies   Home Medications  Prior to Admission medications   Medication Sig Start Date End Date Taking? Authorizing Provider  acetaminophen (TYLENOL) 500 MG tablet Take 1,000 mg by mouth every 6 (six) hours as needed for moderate pain.   Yes [provider]  amLODipine (NORVASC) 10 MG tablet Take 1 tablet (10 mg total) by mouth daily. 11/25/16  Yes Hawks, Christy A, FNP  Ascorbic Acid (VITAMIN C) 1000 MG tablet Take 1,000 mg by mouth daily.   Yes [provider]  carvedilol (COREG) 12.5 MG tablet Take 12.5 mg by mouth 2 (two) times daily with a meal.   Yes [provider]  cloNIDine (CATAPRES) 0.1 MG tablet Take 0.1 mg by mouth 2 (two) times daily.   Yes [provider]  hydrALAZINE (APRESOLINE) 50 MG tablet Take 1.5 tablets (75 mg total) by mouth 3 (three) times  daily. Patient taking differently:  No sig reported 11/24/16  Yes Herminio Commons, MD  insulin degludec (TRESIBA) 100 UNIT/ML SOPN FlexTouch Pen Inject 13 Units into the skin daily.    Yes [provider]  insulin lispro (HUMALOG) 100 UNIT/ML KwikPen Inject 0-20 Units into the skin 3 (three) times daily as needed (high blood sugar). Sliding scale. 03/07/18  Yes [provider]  levofloxacin (LEVAQUIN) 500 MG tablet Take 500 mg by mouth daily. Start date : 06/20/20   Yes [provider]  LIPITOR 20 MG tablet TAKE 1 BY MOUTH DAILY Patient taking differently: Take 20 mg by mouth daily.  01/14/17  Yes Hawks, Christy A, FNP  lisinopril (ZESTRIL) 2.5 MG tablet Take 2.5 mg by mouth daily.   Yes [provider]  ondansetron (ZOFRAN) 4 MG tablet Take 4 mg by mouth every 8 (eight) hours as needed for nausea or vomiting.   Yes [provider]  pantoprazole (PROTONIX) 20 MG tablet Take 20 mg by mouth 2 (two) times daily.   Yes [provider]  predniSONE (DELTASONE) 5 MG tablet Take 5 mg by mouth daily with breakfast.   Yes [provider]  sulfamethoxazole-trimethoprim (BACTRIM) 400-80 MG tablet Take 1 tablet by mouth See admin instructions. Takes 1 tablet daily 3 times a week-Monday, Wednesday and Friday   Yes [provider]  tacrolimus (PROGRAF) 1 MG capsule Take 0.5-1 mg by mouth See admin instructions. Takes 1 tablet in the morning and 1/2 tablet at night   Yes [provider]  tamsulosin (FLOMAX) 0.4 MG CAPS capsule Take 0.4 mg by mouth daily.   Yes [provider]  HYDROcodone-acetaminophen (NORCO/VICODIN) 5-325 MG tablet 1 po q 4-6 hrs prn for pain Patient not taking: Reported on 07/02/2020 11/09/17   Garald Balding, MD  metoCLOPramide (REGLAN) 5 MG tablet Take 1 tablet (5 mg total) by mouth 3 (three) times daily before meals. Patient not taking: Reported on 11/16/2017 05/19/17   Rogene Houston, MD   mycophenolate (MYFORTIC) 180 MG EC tablet Take 540 mg by mouth 2 (two) times daily.     [provider]       Christinia Gully, MD Pulmonary and [place] [phone]   After 7:00 pm call Elink  [phone]

## 2020-07-07 ENCOUNTER — Inpatient Hospital Stay (HOSPITAL_COMMUNITY): Payer: Medicare Other

## 2020-07-07 DIAGNOSIS — J930 Spontaneous tension pneumothorax: Secondary | ICD-10-CM

## 2020-07-07 LAB — BLOOD GAS, VENOUS

## 2020-07-07 LAB — BASIC METABOLIC PANEL
Anion gap: 11 (ref 5–15)
BUN: 43 mg/dL — ABNORMAL HIGH (ref 6–20)
CO2: 21 mmol/L — ABNORMAL LOW (ref 22–32)
Calcium: 7.6 mg/dL — ABNORMAL LOW (ref 8.9–10.3)
Chloride: 105 mmol/L (ref 98–111)
Creatinine, Ser: 0.82 mg/dL (ref 0.61–1.24)
GFR, Estimated: >60 mL/min (ref >60)
Glucose, Bld: 174 mg/dL — ABNORMAL HIGH (ref 70–99)
Potassium: 4.8 mmol/L (ref 3.5–5.1)
Sodium: 137 mmol/L (ref 135–145)

## 2020-07-07 LAB — GLUCOSE, CAPILLARY
Glucose-Capillary: 108 mg/dL — ABNORMAL HIGH (ref 70–99)
Glucose-Capillary: 155 mg/dL — ABNORMAL HIGH (ref 70–99)
Glucose-Capillary: 149 mg/dL — ABNORMAL HIGH (ref 70–99)
Glucose-Capillary: 176 mg/dL — ABNORMAL HIGH (ref 70–99)
Glucose-Capillary: 168 mg/dL — ABNORMAL HIGH (ref 70–99)
Glucose-Capillary: 188 mg/dL — ABNORMAL HIGH (ref 70–99)

## 2020-07-07 LAB — CBC
HCT: 42.3 % (ref 39.0–52.0)
HCT: 40.5 % (ref 39.0–52.0)
Hemoglobin: 13.5 g/dL (ref 13.0–17.0)
Hemoglobin: 12.7 g/dL — ABNORMAL LOW (ref 13.0–17.0)
MCH: 26.5 pg (ref 26.0–34.0)
MCH: 26.3 pg (ref 26.0–34.0)
MCHC: 31.9 g/dL (ref 30.0–36.0)
MCHC: 31.4 g/dL (ref 30.0–36.0)
MCV: 82.9 fL (ref 80.0–100.0)
MCV: 83.9 fL (ref 80.0–100.0)
Platelets: 127 10*3/uL — ABNORMAL LOW (ref 150–400)
Platelets: 190 10*3/uL (ref 150–400)
RBC: 5.1 MIL/uL (ref 4.22–5.81)
RBC: 4.83 MIL/uL (ref 4.22–5.81)
RDW: 14.5 % (ref 11.5–15.5)
RDW: 14.4 % (ref 11.5–15.5)
WBC: 22.8 10*3/uL — ABNORMAL HIGH (ref 4.0–10.5)
WBC: 25.7 10*3/uL — ABNORMAL HIGH (ref 4.0–10.5)
nRBC: 0 % (ref 0.0–0.2)
nRBC: 0 % (ref 0.0–0.2)

## 2020-07-07 LAB — BLOOD GAS, ARTERIAL
Acid-base deficit: 4 mmol/L — ABNORMAL HIGH (ref 0.0–2.0)
Bicarbonate: 21.8 mmol/L (ref 20.0–28.0)
Drawn by: 56002
FIO2: 100
MECHVT: 430 mL
O2 Saturation: 92 %
PEEP: 10 cmH2O
Patient temperature: 98.6
RATE: 24 resp/min
pCO2 arterial: 45 mmHg (ref 32.0–48.0)
pH, Arterial: 7.307 — ABNORMAL LOW (ref 7.350–7.450)
pO2, Arterial: 77.2 mmHg — ABNORMAL LOW (ref 83.0–108.0)

## 2020-07-07 LAB — MAGNESIUM: Magnesium: 2 mg/dL (ref 1.7–2.4)

## 2020-07-07 LAB — COMPREHENSIVE METABOLIC PANEL
ALT: 36 U/L (ref 0–44)
AST: 38 U/L (ref 15–41)
Albumin: 2.4 g/dL — ABNORMAL LOW (ref 3.5–5.0)
Alkaline Phosphatase: 96 U/L (ref 38–126)
Anion gap: 10 (ref 5–15)
BUN: 34 mg/dL — ABNORMAL HIGH (ref 6–20)
CO2: 21 mmol/L — ABNORMAL LOW (ref 22–32)
Calcium: 8 mg/dL — ABNORMAL LOW (ref 8.9–10.3)
Chloride: 106 mmol/L (ref 98–111)
Creatinine, Ser: 0.93 mg/dL (ref 0.61–1.24)
GFR, Estimated: >60 mL/min (ref >60)
Glucose, Bld: 107 mg/dL — ABNORMAL HIGH (ref 70–99)
Potassium: 5 mmol/L (ref 3.5–5.1)
Sodium: 137 mmol/L (ref 135–145)
Total Bilirubin: 0.8 mg/dL (ref 0.3–1.2)
Total Protein: 5.1 g/dL — ABNORMAL LOW (ref 6.5–8.1)

## 2020-07-07 LAB — PROTIME-INR
INR: 1.3 — ABNORMAL HIGH (ref 0.8–1.2)
Prothrombin Time: 15.4 seconds — ABNORMAL HIGH (ref 11.4–15.2)

## 2020-07-07 LAB — APTT: aPTT: 42 seconds — ABNORMAL HIGH (ref 24–36)

## 2020-07-07 LAB — TRIGLYCERIDES: Triglycerides: 257 mg/dL — ABNORMAL HIGH (ref <150)

## 2020-07-07 LAB — PHOSPHORUS: Phosphorus: 4.1 mg/dL (ref 2.5–4.6)

## 2020-07-07 MED ORDER — FENTANYL CITRATE (PF) 100 MCG/2ML IJ SOLN
50.0000 ug | INTRAMUSCULAR | Status: DC | PRN
  Administered 2020-07-07 (×2): 50 ug via INTRAVENOUS
  Filled 2020-07-07: qty 2

## 2020-07-07 MED ORDER — LIDOCAINE HCL (PF) 1 % IJ SOLN
INTRAMUSCULAR | Status: AC
  Filled 2020-07-07: qty 5

## 2020-07-07 MED ORDER — VECURONIUM BROMIDE 10 MG IV SOLR
0.1000 mg/kg | INTRAVENOUS | Status: DC | PRN
  Administered 2020-07-08 – 2020-07-09 (×3): 5.7 mg via INTRAVENOUS
  Filled 2020-07-07 (×4): qty 10

## 2020-07-07 MED ORDER — PROSOURCE TF PO LIQD
45.0000 mL | Freq: Two times a day (BID) | ORAL | Status: DC
  Administered 2020-07-07 – 2020-07-09 (×5): 45 mL
  Filled 2020-07-07 (×4): qty 45

## 2020-07-07 MED ORDER — PHENYLEPHRINE 40 MCG/ML (10ML) SYRINGE FOR IV PUSH (FOR BLOOD PRESSURE SUPPORT)
PREFILLED_SYRINGE | INTRAVENOUS | Status: AC
  Filled 2020-07-07: qty 20

## 2020-07-07 MED ORDER — PIVOT 1.5 CAL PO LIQD
1000.0000 mL | ORAL | Status: DC
  Administered 2020-07-07 (×2): 1000 mL
  Filled 2020-07-07 (×3): qty 1000

## 2020-07-07 MED ORDER — INSULIN ASPART 100 UNIT/ML ~~LOC~~ SOLN
0.0000 [IU] | SUBCUTANEOUS | Status: DC
  Administered 2020-07-07 (×3): 2 [IU] via SUBCUTANEOUS
  Administered 2020-07-08: 13:00:00 7 [IU] via SUBCUTANEOUS
  Administered 2020-07-08: 03:00:00 3 [IU] via SUBCUTANEOUS
  Administered 2020-07-08: 08:00:00 2 [IU] via SUBCUTANEOUS

## 2020-07-07 MED ORDER — MIDAZOLAM HCL 2 MG/2ML IJ SOLN
2.0000 mg | Freq: Once | INTRAMUSCULAR | Status: AC
  Administered 2020-07-07: 17:00:00 2 mg via INTRAVENOUS
  Filled 2020-07-07: qty 2

## 2020-07-07 MED ORDER — ARTIFICIAL TEARS OPHTHALMIC OINT
1.0000 "application " | TOPICAL_OINTMENT | Freq: Three times a day (TID) | OPHTHALMIC | Status: DC
  Administered 2020-07-07 – 2020-07-09 (×5): 1 via OPHTHALMIC
  Filled 2020-07-07: qty 3.5

## 2020-07-07 MED ORDER — MIDAZOLAM HCL 2 MG/2ML IJ SOLN
INTRAMUSCULAR | Status: AC
  Filled 2020-07-07: qty 4

## 2020-07-07 MED ORDER — SODIUM CHLORIDE 0.9% FLUSH: 10.0000 mL | INTRAVENOUS | Status: DC | PRN

## 2020-07-07 MED ORDER — STERILE WATER FOR INJECTION IJ SOLN
INTRAMUSCULAR | Status: AC
  Filled 2020-07-07: qty 10

## 2020-07-07 MED ORDER — LIDOCAINE HCL 1 % IJ SOLN
INTRAMUSCULAR | Status: AC
  Filled 2020-07-07: qty 20

## 2020-07-07 MED ORDER — FENTANYL CITRATE (PF) 100 MCG/2ML IJ SOLN
INTRAMUSCULAR | Status: AC
  Filled 2020-07-07: qty 2

## 2020-07-07 MED ORDER — CLONIDINE HCL 0.1 MG PO TABS
0.1000 mg | ORAL_TABLET | Freq: Two times a day (BID) | ORAL | Status: DC
  Filled 2020-07-07: qty 1

## 2020-07-07 MED ORDER — SODIUM CHLORIDE 0.9% FLUSH
10.0000 mL | Freq: Two times a day (BID) | INTRAVENOUS | Status: DC
  Administered 2020-07-07 – 2020-07-08 (×3): 10 mL
  Administered 2020-07-08: 10:00:00 20 mL
  Administered 2020-07-09: 10:00:00 10 mL

## 2020-07-07 MED ORDER — NOREPINEPHRINE 4 MG/250ML-% IV SOLN: 2.0000 ug/min | INTRAVENOUS | Status: DC

## 2020-07-07 MED ORDER — POLYETHYLENE GLYCOL 3350 17 G PO PACK
17.0000 g | PACK | Freq: Every day | ORAL | Status: DC
  Administered 2020-07-07 – 2020-07-09 (×3): 17 g
  Filled 2020-07-07 (×3): qty 1

## 2020-07-07 MED ORDER — PROPOFOL 1000 MG/100ML IV EMUL
INTRAVENOUS | Status: AC
  Filled 2020-07-07: qty 100

## 2020-07-07 MED ORDER — FENTANYL 2500MCG IN NS 250ML (10MCG/ML) PREMIX INFUSION
0.0000 ug/h | INTRAVENOUS | Status: DC
  Administered 2020-07-07: 11:00:00 100 ug/h via INTRAVENOUS
  Administered 2020-07-07: 20:00:00 300 ug/h via INTRAVENOUS
  Administered 2020-07-08 (×2): 200 ug/h via INTRAVENOUS
  Administered 2020-07-09: 21:00:00 275 ug/h via INTRAVENOUS
  Administered 2020-07-09 (×2): 250 ug/h via INTRAVENOUS
  Filled 2020-07-07 (×7): qty 250

## 2020-07-07 MED ORDER — HYDRALAZINE HCL 50 MG PO TABS
100.0000 mg | ORAL_TABLET | Freq: Three times a day (TID) | ORAL | Status: DC
  Filled 2020-07-07: qty 2

## 2020-07-07 MED ORDER — ETOMIDATE 2 MG/ML IV SOLN
INTRAVENOUS | Status: AC
  Filled 2020-07-07: qty 20

## 2020-07-07 MED ORDER — LIDOCAINE HCL (CARDIAC) PF 100 MG/5ML IV SOSY
PREFILLED_SYRINGE | INTRAVENOUS | Status: AC
  Filled 2020-07-07: qty 5

## 2020-07-07 MED ORDER — SUCCINYLCHOLINE CHLORIDE 200 MG/10ML IV SOSY
PREFILLED_SYRINGE | INTRAVENOUS | Status: AC
  Filled 2020-07-07: qty 10

## 2020-07-07 MED ORDER — VITAL HIGH PROTEIN PO LIQD: 1000.0000 mL | ORAL | Status: DC

## 2020-07-07 MED ORDER — DOCUSATE SODIUM 50 MG/5ML PO LIQD
100.0000 mg | Freq: Two times a day (BID) | ORAL | Status: DC
  Administered 2020-07-07 – 2020-07-09 (×5): 100 mg
  Filled 2020-07-07 (×5): qty 10

## 2020-07-07 MED ORDER — PROPOFOL 1000 MG/100ML IV EMUL
0.0000 ug/kg/min | INTRAVENOUS | Status: DC
  Administered 2020-07-07: 23:00:00 40 ug/kg/min via INTRAVENOUS
  Administered 2020-07-07: 17:00:00 45 ug/kg/min via INTRAVENOUS
  Administered 2020-07-07: 04:00:00 5 ug/kg/min via INTRAVENOUS
  Administered 2020-07-07: 11:00:00 40 ug/kg/min via INTRAVENOUS
  Administered 2020-07-08: 17:00:00 20 ug/kg/min via INTRAVENOUS
  Administered 2020-07-08: 23:00:00 40 ug/kg/min via INTRAVENOUS
  Administered 2020-07-08: 09:00:00 20 ug/kg/min via INTRAVENOUS
  Administered 2020-07-09 (×3): 40 ug/kg/min via INTRAVENOUS
  Administered 2020-07-09: 23:00:00 50 ug/kg/min via INTRAVENOUS
  Filled 2020-07-07 (×9): qty 100

## 2020-07-07 MED ORDER — ROCURONIUM BROMIDE 10 MG/ML (PF) SYRINGE
PREFILLED_SYRINGE | INTRAVENOUS | Status: AC
  Filled 2020-07-07: qty 10

## 2020-07-07 MED ORDER — PROPOFOL 500 MG/50ML IV EMUL
INTRAVENOUS | Status: AC
  Filled 2020-07-07: qty 50

## 2020-07-07 MED ORDER — MIDAZOLAM BOLUS VIA INFUSION: 2.0000 mg | Freq: Once | INTRAVENOUS | Status: DC

## 2020-07-07 MED ORDER — VECURONIUM BROMIDE 10 MG IV SOLR
INTRAVENOUS | Status: AC
  Filled 2020-07-07: qty 10

## 2020-07-07 MED ORDER — CARVEDILOL 12.5 MG PO TABS: 12.5000 mg | ORAL_TABLET | Freq: Two times a day (BID) | ORAL | Status: DC

## 2020-07-07 MED ORDER — SODIUM CHLORIDE 0.9 % IV SOLN: 250.0000 mL | INTRAVENOUS | Status: DC

## 2020-07-07 MED ORDER — ATORVASTATIN CALCIUM 10 MG PO TABS
20.0000 mg | ORAL_TABLET | Freq: Every day | ORAL | Status: DC
  Administered 2020-07-08 – 2020-07-09 (×2): 20 mg
  Filled 2020-07-07 (×2): qty 2

## 2020-07-07 MED ORDER — NOREPINEPHRINE 4 MG/250ML-% IV SOLN
0.0000 ug/min | INTRAVENOUS | Status: DC
Start: 2020-07-07 — End: 2020-07-07

## 2020-07-07 MED ORDER — CHLORHEXIDINE GLUCONATE 0.12% ORAL RINSE (MEDLINE KIT)
15.0000 mL | Freq: Two times a day (BID) | OROMUCOSAL | Status: DC
  Administered 2020-07-07 – 2020-07-09 (×4): 15 mL via OROMUCOSAL

## 2020-07-07 MED ORDER — FENTANYL CITRATE (PF) 100 MCG/2ML IJ SOLN
50.0000 ug | INTRAMUSCULAR | Status: DC | PRN
  Administered 2020-07-08 – 2020-07-09 (×2): 100 ug via INTRAVENOUS
  Administered 2020-07-09 (×2): 50 ug via INTRAVENOUS
  Administered 2020-07-10: 100 ug via INTRAVENOUS
  Filled 2020-07-07 (×2): qty 2

## 2020-07-07 MED ORDER — ORAL CARE MOUTH RINSE
15.0000 mL | OROMUCOSAL | Status: DC
  Administered 2020-07-08 – 2020-07-09 (×17): 15 mL via OROMUCOSAL

## 2020-07-07 MED ORDER — NOREPINEPHRINE 4 MG/250ML-% IV SOLN
0.0000 ug/min | INTRAVENOUS | Status: DC
  Administered 2020-07-07: 20:00:00 18 ug/min via INTRAVENOUS
  Administered 2020-07-07: 25 ug/min via INTRAVENOUS
  Administered 2020-07-07: 16:00:00 12 ug/min via INTRAVENOUS
  Filled 2020-07-07: qty 250
  Filled 2020-07-07: qty 500

## 2020-07-07 MED ORDER — SODIUM CHLORIDE 0.9 % IV BOLUS
500.0000 mL | Freq: Once | INTRAVENOUS | Status: AC
  Administered 2020-07-07: 06:00:00 500 mL via INTRAVENOUS

## 2020-07-07 MED ORDER — NOREPINEPHRINE 4 MG/250ML-% IV SOLN
INTRAVENOUS | Status: AC
  Administered 2020-07-07: 07:00:00 5 ug/min via INTRAVENOUS
  Filled 2020-07-07: qty 250

## 2020-07-07 MED ORDER — PROPOFOL 10 MG/ML IV BOLUS
INTRAVENOUS | Status: AC
  Filled 2020-07-07: qty 20

## 2020-07-07 NOTE — Procedures (Signed)
Intubation Procedure Note  Craig Fisher  [id number]  [birth year]  Date:07/07/20  Time:4:04 AM   Provider Performing:Craig Fisher    Procedure: Intubation (31500)  Indication(s) Respiratory Failure  Consent Risks of the procedure as well as the alternatives and risks of each were explained to the patient and/or caregiver.  Consent for the procedure was obtained and is signed in the bedside chart   Anesthesia Etomidate and Rocuronium   Time Out Verified patient identification, verified procedure, site/side was marked, verified correct patient position, special equipment/implants available, medications/allergies/relevant history reviewed, required imaging and test results available.   Sterile Technique Usual hand hygeine, masks, and gloves were used   Procedure Description Patient positioned in bed supine.  Sedation given as noted above.  Patient was intubated with endotracheal tube using Glidescope s3.  View was IIa.  Number of attempts was 1.  Colorimetric CO2 detector was consistent with tracheal placement.   Complications/Tolerance None; patient tolerated the procedure well. Chest X-ray is ordered to verify placement.   EBL Minimal   Specimen(s) None

## 2020-07-07 NOTE — Progress Notes (Signed)
PCCM Brief Progress Note  Asked to evaluate pt due to increased work of breathing, desaturation on BiPAP to 70s. By the time I arrived he was saturating in mid-80s to 90%, self proning as much as he could tolerate. He said he was fatigued to the point where he was ready to undergo trial of intubation/mech vent. CXR revealed no PTX. He was intubated and placed central line due to poor intravenous access. Site selection was challenging since, given his ARDS and wish to avoid lung injury, I had preference for femoral vein however there was overlying rash. IJs were quite collapsible and left subclavian appeared to be best available target. Left subclavian central line was placed and initially appeared to be uncomplicated however 15 minutes post-procedure he developed worsening hypoxia and hypotension. Bedside US revealed no lung slide on left, and emergent 60F chest tube placed for decompression.    CRITICAL CARE Performed by: Maryjane Hurter   Total critical care time: 45 minutes  Critical care time was exclusive of separately billable procedures and treating other patients.  Critical care was necessary to treat or prevent imminent or life-threatening deterioration.  Critical care was time spent personally by me on the following activities: development of treatment plan with patient and/or surrogate as well as nursing, evaluation of patient's response to treatment, examination of patient, obtaining history from patient or surrogate, ordering and performing treatments and interventions, ordering and review of laboratory studies, bedside US, ordering and review of radiographic studies, pulse oximetry, re-evaluation of patient's condition and participation in multidisciplinary rounds.  Letta Median, Pulmonary/Critical Care

## 2020-07-07 NOTE — Progress Notes (Signed)
eLink Physician-Brief Progress Note Patient Name: Craig Fisher DOB: [birth year] MRN: [id number]   Date of Service  07/07/2020  HPI/Events of Note  Hypoxemia - Now on BiPAP 16/10 with Sat = 85%, RR = 21. Admitted for COVID pneumonia. I suspect progression of his lung disease.   eICU Interventions  Plan: 1. Portable CXR at 5 AM. 2. Watch closely for need for intubation.     Intervention Category Major Interventions: Hypoxemia - evaluation and management  Gabrielle Wakeland Eugene 07/07/2020, 2:18 AM

## 2020-07-07 NOTE — TOC Initial Note (Addendum)
Transition of Care [facility]) - Initial/Assessment Note    Patient Details  Name: Craig Fisher MRN: [id number] Date of Birth: Jun 05, 1961  [facility] CM/SW Contact:    Leeroy Cha, RN Phone Number: 07/07/2020, 8:53 AM  Clinical Narrative:                 59 yo hispanic male never smoker vaccinated but no booster w/in 6 m with onset of symptoms 12/1 admit 12/6 with covid pna much worse since 12/18 so transferred to step down for bipap pm 12/19 and pccm consulted.  PLAN- RETURN TO HOME FOLLOIWIING FOR PROGRESSION vent at 100% iv solu medro, iv maxipime and vanco ready, iv sedated, iv levophed, left chest tube , wbc-22.8 Expected Discharge Plan: Home/Self Care Barriers to Discharge: No Barriers Identified   Patient Goals and CMS Choice Patient states their goals for this hospitalization and ongoing recovery are:: unable to state due to vent   Choice offered to / list presented to : Spouse  Expected Discharge Plan and Services Expected Discharge Plan: Home/Self Care   Discharge Planning Services: CM Consult   Living arrangements for the past 2 months: Single Family Home                                      Prior Living Arrangements/Services Living arrangements for the past 2 months: Single Family Home Lives with:: Spouse Patient language and need for interpreter reviewed:: Yes        Need for Family Participation in Patient Care: Yes (Comment)     Criminal Activity/Legal Involvement Pertinent to Current Situation/Hospitalization: No - Comment as needed  Activities of Daily Living Home Assistive Devices/Equipment: Eyeglasses ADL Screening (condition at time of admission) Patient's cognitive ability adequate to safely complete daily activities?: Yes Is the patient deaf or have difficulty hearing?: No Does the patient have difficulty seeing, even when wearing glasses/contacts?: Yes Does the patient have difficulty concentrating, remembering, or making  decisions?: No Patient able to express need for assistance with ADLs?: Yes Does the patient have difficulty dressing or bathing?: No Independently performs ADLs?: Yes (appropriate for developmental age) Does the patient have difficulty walking or climbing stairs?: Yes Weakness of Legs: Both Weakness of Arms/Hands: Both  Permission Sought/Granted                  Emotional Assessment Appearance:: Appears stated age Attitude/Demeanor/Rapport: Unable to Assess Affect (typically observed): Unable to Assess Orientation: : Fluctuating Orientation (Suspected and/or reported Sundowners) Alcohol / Substance Use: Not Applicable Psych Involvement: No (comment)  Admission diagnosis:  Hyperglycemia [R73.9] Hypoxia [R09.02] Acute hypoxemic respiratory failure due to COVID-19 ([place]) [U07.1, J96.01] COVID-19 [U07.1] Patient Active Problem List   Diagnosis Date Noted  . Tension pneumothorax   . COVID-19   . SOB (shortness of breath)   . Acute hypoxemic respiratory failure due to COVID-19 ([place]) 07/15/2020  . Renal transplant recipient 07/14/2020  . Hyponatremia 06/18/2020  . Chronic kidney disease, stage 3a ([place]) 06/27/2020  . Hypertensive urgency 06/30/2020  . Abdominal pain with vomiting 03/16/2017  . Non-intractable vomiting with nausea 03/16/2017  . GERD (gastroesophageal reflux disease) 10/08/2016  . Peritoneal dialysis catheter placed 07/07/2016 07/07/2016  . ESRD (end stage renal disease) on dialysis ([place]) 07/07/2016  . ESRD on dialysis ([place]) 12/05/2015  . Chronic kidney disease requiring chronic dialysis ([place]) 10/21/2015  . Chronic kidney disease (CKD) stage G4/A1 04/07/2015  .  Edema 08/20/2014  . Diabetes mellitus type 2 with complications, uncontrolled ([place]) 05/06/2014  . Type 2 diabetes mellitus with diabetic nephropathy ([place]) 05/06/2014  . Bilateral inguinal hernia (BIH) s/p lap repair w mesh 07/07/2016 01/23/2014  . Diabetic retinopathy ([place]) 03/30/2013  . Essential  hypertension 12/09/2012  . Hyperlipidemia 12/09/2012  . Helicobacter pylori antibody positive 12/09/2012   PCP:  Sharion Balloon, FNP Pharmacy:   [facility] ([facility]) - [place], [address] [place] [address] Phone: [phone] Fax: [place] [address], [place] [address] Phone: [phone] Fax: [phone]     Social Determinants of Health (SDOH) Interventions    Readmission Risk Interventions No flowsheet data found.

## 2020-07-07 NOTE — Progress Notes (Signed)
Initial Nutrition Assessment  DOCUMENTATION CODES:   Not applicable  INTERVENTION:  - will initiate TF via OGT: Pivot 1.5 @ 25 ml/hr to advance by 10 ml every 4 hours to reach goal rate of 45 ml/hr with 45 ml Prosource TF BID. - at goal rate, this regimen + kcal from current propofol rate will provide 2060 kcal, 123 grams protein, and 820 ml free water. - free water flush to be per CCM.   NUTRITION DIAGNOSIS:   Increased nutrient needs related to acute illness,catabolic illness (UEKCM-03 infection) as evidenced by estimated needs.  GOAL:   Patient will meet greater than or equal to 90% of their needs  MONITOR:   Vent status,TF tolerance,Labs,Weight trends  REASON FOR ASSESSMENT:   Ventilator,Consult Enteral/tube feeding initiation and management  ASSESSMENT:   59 year old male with medical history of ESRD d/t uncontrolled DM s/p renal transplant in 2019, stage 3 CKD, DM, and HTN. He was dx with COVID-19 on 12/3 and presented to the ED due to SOB, hypoxia, abdominal pain, N/V/D x1 week. He was also feeling weak with worsening fatigue and malaise. Plan was for monoclonal antibodies on 12/6 but he was admitted to the hospital before this could occur.  Patient was admitted on 12/6. He was intubated today at ~0400. OGT placed (no abdominal xray; CRX report states gastric). Able to talk with CCM and ok for RD to start TF today. Patient was previously on Heart Healthy/Carb Modified diet and eating 100% of nearly all meals 12/13-12/16.  Weight on 12/6 was 141 lb, weight on 12/16 was 124 lb and has been stable since that time. No information documented in the edema section of flow sheet.   Per RN, likely plan for proning.   Patient is currently intubated on ventilator support MV: 12 L/min Temp (24hrs), Avg:98.1 F (36.7 C), Min:97.8 F (36.6 C), Max:98.6 F (37 C) Propofol: 13.63 ml/hr (360 kcal) BP: 104/40 and MAP: 68   Labs reviewed; HgbA1c: 8.9%, CBGs: 108, 155, 149 mg/dl,  BUN: 34 mg/dl, Ca: 8 mg/dl.  Medications reviewed; 100 mg colace BID, sliding scale novolog, 4 units lantus/day, 40 mg solu-medrol BID, 40 mg IV protonix/day, 17 g miralax/day, 1.5 g prograf/day. Drips; fentanyl @ 100 mcg/hr, propofol @ 40 mcg/kg/min.     NUTRITION - FOCUSED PHYSICAL EXAM:  did not complete at this time.   Diet Order:   Diet Order            Diet heart healthy/carb modified Room service appropriate? Yes; Fluid consistency: Thin  Diet effective now                 EDUCATION NEEDS:   Not appropriate for education at this time  Skin:  Skin Assessment: Reviewed RN Assessment  Last BM:  12/17 (type 6)  Height:   Ht Readings from Last 1 Encounters:  07/07/20 5\' 2"  (1.575 m)    Weight:   Wt Readings from Last 1 Encounters:  07/06/20 56.8 kg    Estimated Nutritional Needs:  Kcal:  1875-2160 kcal (33-38 kcal/kg) Protein:  115-130 grams Fluid:  >/= 3 L/day      Jarome Matin, MS, RD, LDN, CNSC Inpatient Clinical Dietitian RD pager # available in [facility]  After hours/weekend pager # available in [facility]

## 2020-07-07 NOTE — Procedures (Signed)
Insertion of Chest Tube Procedure Note  Craig Fisher  [id number]  [birth year]  Date:07/07/20  Time:6:32 AM    Provider Performing: Maryjane Hurter   Procedure: Chest Tube Insertion [phone])  Indication(s) Pneumothorax  Consent Unable to obtain consent due to emergent nature of procedure.  Anesthesia Topical only with 1% lidocaine    Time Out Verified patient identification, verified procedure, site/side was marked, verified correct patient position, special equipment/implants available, medications/allergies/relevant history reviewed, required imaging and test results available.   Sterile Technique Maximal sterile technique including full sterile barrier drape, hand hygiene, sterile gown, sterile gloves, mask, hair covering, sterile ultrasound probe cover (if used).   Procedure Description Ultrasound not used to identify appropriate pleural anatomy for placement and overlying skin marked. Area of placement cleaned and draped in sterile fashion.  A 20 French chest tube was placed into the left pleural space using Kelly dissection. Appropriate return of air was obtained.  The tube was connected to atrium and placed on -20 cm H2O wall suction.   Complications/Tolerance None; patient tolerated the procedure well. Chest X-ray is ordered to verify placement.   EBL Minimal  Specimen(s) none

## 2020-07-07 NOTE — Progress Notes (Signed)
eLink Physician-Brief Progress Note Patient Name: Craig Fisher DOB: September 27, 1960 MRN: [id number]   Date of Service  07/07/2020  HPI/Events of Note  Hypoxia - O2 saturations decreased to 76% with minimal exertion,   eICU Interventions  Will request that ground team evaluate the patient at bedside for possible intubation.      Intervention Category Major Interventions: Hypoxemia - evaluation and management  [name] 07/07/2020, 2:29 AM

## 2020-07-07 NOTE — Progress Notes (Signed)
RT attempted ABG x2 and unsuccessful. RN aware and will be calling MD to inform that we need an Aline placed.

## 2020-07-07 NOTE — Procedures (Signed)
Central Venous Catheter Insertion Procedure Note  Craig Fisher  [id number]  13-Aug-1960  Date:07/07/20  Time:5:21 AM   Provider Performing:Chailyn Racette Jerilynn Mages [name]   Procedure: Insertion of Non-tunneled Central Venous Catheter(36556) with US guidance ([id number])   Indication(s) Medication administration and Difficult access  Consent Unable to obtain consent due to emergent nature of procedure.  Anesthesia Topical only with 1% lidocaine   Timeout Verified patient identification, verified procedure, site/side was marked, verified correct patient position, special equipment/implants available, medications/allergies/relevant history reviewed, required imaging and test results available.  Sterile Technique Maximal sterile technique including full sterile barrier drape, hand hygiene, sterile gown, sterile gloves, mask, hair covering, sterile ultrasound probe cover (if used).  Procedure Description Area of catheter insertion was cleaned with chlorhexidine and draped in sterile fashion.  With real-time ultrasound guidance a central venous catheter was placed into the left subclavian vein. In setting of his ARDS would have preferred placement at femoral site however there was overlying rash and IJ sites were highly collapsible in trendelenburg, best remaining target appeared to be left subclavian. Nonpulsatile blood flow and easy flushing noted in all ports.  The catheter was sutured in place and sterile dressing applied.  Complications/Tolerance None; patient tolerated the procedure well. Chest X-ray is ordered to verify placement for internal jugular or subclavian cannulation.   Chest x-ray is not ordered for femoral cannulation.  EBL Minimal  Specimen(s) None

## 2020-07-07 NOTE — Progress Notes (Signed)
eLink Physician-Brief Progress Note Patient Name: Craig Fisher DOB: 04-04-1961 MRN: [id number]   Date of Service  07/07/2020  HPI/Events of Note  Bleeding from chest tube and chest tube insertion site.   eICU Interventions  Plan: 1. H/H STAT. 2. Hold Lovenox until patient re-evaluated by rounding team in AM. 3. Await PT/INR and PTT.     Intervention Category Major Interventions: Other:  [name] 07/07/2020, 11:38 PM

## 2020-07-07 NOTE — Progress Notes (Signed)
Pt 02sats started fluctuating this RN paged elink and the MD recommended possible intubation with further monitoring. Pt was laid to a prone position with bipap and stas trended up and maintained mid 90s after communicating with ccm doctor A unanimous decision by Pt and ccm physician to intubate. Pt currently being intubated transferred care to [facility] ICU.

## 2020-07-07 NOTE — Progress Notes (Addendum)
NAME:  Craig Fisher, MRN:  [id number], DOB:  [birth year], LOS: 3 ADMISSION DATE:  07/05/2020, CONSULTATION DATE:  12/19 REFERRING MD:  Triad, CHIEF COMPLAINT:  Worsening resp failure    Brief History:  59 yo hispanic male never smoker vaccinated but no booster w/in 6 m with onset of symptoms 12/1 admit 12/6 with covid pna much worse since 12/18 so transferred to step down for bipap pm 12/19 and pccm consulted.    Past Medical History:  Anemia, Blind left eye, Complication of anesthesia, Diabetes mellitus without complication ([place]), Diabetic retinopathy ([place]), ESRD (end stage renal disease) ([place]), Full dentures, Headache, Hyperlipidemia, Hypertension, Shortness of breath dyspnea, and Wears glasses.   Significant Hospital Events:  Transferred to SDU  12/19 IM in contact with his renal transplant team who are aware that he has been admitted to the hospital, agreed with admission to this facility, and advised holding the Myfortic and continuing Prograf. 12/20 acute resp failure. Intubated. Iatrogenic left PTX chest tube placed, starting prone positioning    Consults:  PCCM 12/19   Procedures:   OETT 12/20>>> Left [place] CVL 12/20>>> Left chest tube 12/20>>>  Significant Diagnostic Tests:  CT w/o contrast 07/02/20   Micro Data:  BC x 2  12/6 neg MRSA PCR  12/19 neg   Antimicrobials/ covid 19 rx :  Baricitinib  12/16 >>> Valcyclovir 12/17 >>>off Vancomycin  12/19 >> Maxepime  12/19 >>>  Interim History / Subjective:  More comfortable on bipap since arriving in unit   Objective   Blood pressure (Abnormal) 144/46, pulse 63, temperature 97.8 F (36.6 C), temperature source Axillary, resp. rate 17, height 5\' 2"  (1.575 m), weight 56.8 kg, SpO2 94 %.    Vent Mode: PRVC FiO2 (%):  [100 %] 100 % Set Rate:  [24 bmp] 24 bmp Vt Set:  [430 mL] 430 mL PEEP:  [10 cmH20] 10 cmH20 Plateau Pressure:  [26 cmH20] 26 cmH20   Intake/Output Summary (Last 24 hours) at 07/07/2020 1217 Last data  filed at 07/07/2020 0700 Gross per 24 hour  Intake 1614.69 ml  Output 925 ml  Net 689.69 ml   Filed Weights   07/04/20 0523 07/05/20 0534 07/06/20 0500  Weight: 56.3 kg 54.4 kg 56.8 kg    Examination: General sedated/intubated HENT orally intubated some bloody oral secretions Pulm pplat 42-->down to 32 after changing to 6cc/kjg  peep 10/fio2 100%; right CT w/ blood tinged pleural fluid no air leak Card RRR abd soft  Ext warm and dry  Neuro sedated GU cl yellow   Resolved Hospital Problem list     Hyponatremia Nausea and vomiting  Assessment & Plan:  Acute Hypoxic resp failure secondary to covid pna and ARDS pcxr w/ diffuse bilateral airspace disease. No sig PTX.  Lines tubes satisfactory  -completed Remdesivir  Plan Changed VT to 6 ml/kg pplat goal < 30/driving pressure goal <15 Cont to titrate peep/fio2; goal pao2 > 65 Initiate prone protocol as P/F ratio < 150 Pad protocol RASS goal -4 PRN NMB for vent synchrony  Am cxr Am abg Day 5 solumedrol can begin taper in am 12/21 Day 6/14 baricitinib Day 2 cefepime and vanc ->plan for 5 d rx   Iatrogenic left ptx ptx resolved. No airleak Plan Ct to sxn X 24 more hours Am cxr  Renal transplant w/ prior ESRD Plan Cont prograg Avoid hypotension Renal dose meds Am chem  HBP   Plan Hydralazine VT Cont coreg and clonidine Watch BP closely   Leukocytosis Plan  Trend   HL Plan Cont statin  Insulin dept DM Plan ssi and lantus   Best practice (evaluated daily)  Diet: tubefeeds 12/20 Pain/Anxiety/Delirium protocol (if indicated): 12/20 started VAP protocol (if indicated):12/20 DVT prophylaxis: LMWH GI prophylaxis: PPI Glucose control: SSI Mobility: prone position 12/20 Code status (family discussion pending)  Disposition:ICU  My cct 60 min  Erick Colace ACNP-BC [place] Pager # [phone] OR # [phone] if no answer

## 2020-07-07 NOTE — Progress Notes (Signed)
eLink Physician-Brief Progress Note Patient Name: Craig Fisher DOB: Feb 04, 1961 MRN: [id number]   Date of Service  07/07/2020  HPI/Events of Note  Nursing reports oozing form chest tube and CVL sites. Hgb = 12.7 and platelets = 190. Patient currently on Lovenox.   eICU Interventions  Plan: 1. PT/INR and PTT STAT.     Intervention Category Major Interventions: Other:  [name] 07/07/2020, 10:20 PM

## 2020-07-07 NOTE — Progress Notes (Signed)
Pt 02sats are in the mid to high 80s respiratory says they are maxed out on settings paged elink awaiting new orders if any.

## 2020-07-08 ENCOUNTER — Inpatient Hospital Stay (HOSPITAL_COMMUNITY): Payer: Medicare Other

## 2020-07-08 ENCOUNTER — Inpatient Hospital Stay: Payer: Self-pay

## 2020-07-08 LAB — PREPARE RBC (CROSSMATCH)

## 2020-07-08 LAB — BLOOD GAS, ARTERIAL
Acid-base deficit: 11.4 mmol/L — ABNORMAL HIGH (ref 0.0–2.0)
Bicarbonate: 17.6 mmol/L — ABNORMAL LOW (ref 20.0–28.0)
Drawn by: 25788
FIO2: 60
MECHVT: 320 mL
O2 Saturation: 84.7 %
PEEP: 10 cmH2O
Patient temperature: 96.1
RATE: 30 resp/min
pCO2 arterial: 51 mmHg — ABNORMAL HIGH (ref 32.0–48.0)
pH, Arterial: 7.154 — CL (ref 7.350–7.450)
pO2, Arterial: 53.8 mmHg — ABNORMAL LOW (ref 83.0–108.0)

## 2020-07-08 LAB — CBC WITH DIFFERENTIAL/PLATELET
Abs Immature Granulocytes: 0.5 10*3/uL — ABNORMAL HIGH (ref 0.00–0.07)
Basophils Absolute: 0.1 10*3/uL (ref 0.0–0.1)
Basophils Relative: 0 %
Eosinophils Absolute: 0 10*3/uL (ref 0.0–0.5)
Eosinophils Relative: 0 %
HCT: 25 % — ABNORMAL LOW (ref 39.0–52.0)
Hemoglobin: 7.7 g/dL — ABNORMAL LOW (ref 13.0–17.0)
Immature Granulocytes: 2 %
Lymphocytes Relative: 1 %
Lymphs Abs: 0.3 10*3/uL — ABNORMAL LOW (ref 0.7–4.0)
MCH: 27 pg (ref 26.0–34.0)
MCHC: 30.8 g/dL (ref 30.0–36.0)
MCV: 87.7 fL (ref 80.0–100.0)
Monocytes Absolute: 1.1 10*3/uL — ABNORMAL HIGH (ref 0.1–1.0)
Monocytes Relative: 5 %
Neutro Abs: 20.1 10*3/uL — ABNORMAL HIGH (ref 1.7–7.7)
Neutrophils Relative %: 92 %
Platelets: 125 10*3/uL — ABNORMAL LOW (ref 150–400)
RBC: 2.85 MIL/uL — ABNORMAL LOW (ref 4.22–5.81)
RDW: 14.6 % (ref 11.5–15.5)
WBC: 22 10*3/uL — ABNORMAL HIGH (ref 4.0–10.5)
nRBC: 0.1 % (ref 0.0–0.2)

## 2020-07-08 LAB — DIC (DISSEMINATED INTRAVASCULAR COAGULATION)PANEL
D-Dimer, Quant: 2.01 ug/mL-FEU — ABNORMAL HIGH (ref 0.00–0.50)
Fibrinogen: 355 mg/dL (ref 210–475)
INR: 1.4 — ABNORMAL HIGH (ref 0.8–1.2)
Platelets: 124 10*3/uL — ABNORMAL LOW (ref 150–400)
Prothrombin Time: 16.8 seconds — ABNORMAL HIGH (ref 11.4–15.2)
aPTT: 41 seconds — ABNORMAL HIGH (ref 24–36)

## 2020-07-08 LAB — COMPREHENSIVE METABOLIC PANEL
ALT: 31 U/L (ref 0–44)
AST: 29 U/L (ref 15–41)
Albumin: 2.1 g/dL — ABNORMAL LOW (ref 3.5–5.0)
Alkaline Phosphatase: 65 U/L (ref 38–126)
Anion gap: 8 (ref 5–15)
BUN: 67 mg/dL — ABNORMAL HIGH (ref 6–20)
CO2: 17 mmol/L — ABNORMAL LOW (ref 22–32)
Calcium: 6.5 mg/dL — ABNORMAL LOW (ref 8.9–10.3)
Chloride: 113 mmol/L — ABNORMAL HIGH (ref 98–111)
Creatinine, Ser: 1.89 mg/dL — ABNORMAL HIGH (ref 0.61–1.24)
GFR, Estimated: 40 mL/min — ABNORMAL LOW (ref >60)
Glucose, Bld: 414 mg/dL — ABNORMAL HIGH (ref 70–99)
Potassium: 4.8 mmol/L (ref 3.5–5.1)
Sodium: 138 mmol/L (ref 135–145)
Total Bilirubin: 0.8 mg/dL (ref 0.3–1.2)
Total Protein: 4.3 g/dL — ABNORMAL LOW (ref 6.5–8.1)

## 2020-07-08 LAB — PHOSPHORUS
Phosphorus: 4.1 mg/dL (ref 2.5–4.6)
Phosphorus: 5.5 mg/dL — ABNORMAL HIGH (ref 2.5–4.6)
Phosphorus: 6 mg/dL — ABNORMAL HIGH (ref 2.5–4.6)

## 2020-07-08 LAB — HEMOGLOBIN AND HEMATOCRIT, BLOOD
HCT: 36 % — ABNORMAL LOW (ref 39.0–52.0)
HCT: 35.7 % — ABNORMAL LOW (ref 39.0–52.0)
Hemoglobin: 11.1 g/dL — ABNORMAL LOW (ref 13.0–17.0)
Hemoglobin: 11.4 g/dL — ABNORMAL LOW (ref 13.0–17.0)

## 2020-07-08 LAB — GLUCOSE, CAPILLARY
Glucose-Capillary: 224 mg/dL — ABNORMAL HIGH (ref 70–99)
Glucose-Capillary: 199 mg/dL — ABNORMAL HIGH (ref 70–99)
Glucose-Capillary: 313 mg/dL — ABNORMAL HIGH (ref 70–99)
Glucose-Capillary: 359 mg/dL — ABNORMAL HIGH (ref 70–99)
Glucose-Capillary: 267 mg/dL — ABNORMAL HIGH (ref 70–99)

## 2020-07-08 LAB — MAGNESIUM
Magnesium: 1.4 mg/dL — ABNORMAL LOW (ref 1.7–2.4)
Magnesium: 2 mg/dL (ref 1.7–2.4)
Magnesium: 2.2 mg/dL (ref 1.7–2.4)

## 2020-07-08 LAB — TRIGLYCERIDES: Triglycerides: 208 mg/dL — ABNORMAL HIGH (ref <150)

## 2020-07-08 LAB — ABO/RH: ABO/RH(D): A POS

## 2020-07-08 MED ORDER — NOREPINEPHRINE 16 MG/250ML-% IV SOLN
0.0000 ug/min | INTRAVENOUS | Status: DC
  Administered 2020-07-08: 02:00:00 31 ug/min via INTRAVENOUS
  Administered 2020-07-08: 23:00:00 17 ug/min via INTRAVENOUS
  Administered 2020-07-08: 09:00:00 32 ug/min via INTRAVENOUS
  Administered 2020-07-09: 14:00:00 14 ug/min via INTRAVENOUS
  Filled 2020-07-08 (×4): qty 250

## 2020-07-08 MED ORDER — SODIUM CHLORIDE 0.9 % IV SOLN
2.0000 g | Freq: Two times a day (BID) | INTRAVENOUS | Status: DC
  Administered 2020-07-08: 21:00:00 2 g via INTRAVENOUS
  Filled 2020-07-08: qty 2

## 2020-07-08 MED ORDER — SODIUM CHLORIDE 0.9% IV SOLUTION: Freq: Once | INTRAVENOUS | Status: AC

## 2020-07-08 MED ORDER — INSULIN ASPART 100 UNIT/ML ~~LOC~~ SOLN
0.0000 [IU] | SUBCUTANEOUS | Status: DC
  Administered 2020-07-08: 17:00:00 20 [IU] via SUBCUTANEOUS
  Administered 2020-07-08: 20:00:00 11 [IU] via SUBCUTANEOUS
  Administered 2020-07-08: 23:00:00 4 [IU] via SUBCUTANEOUS
  Administered 2020-07-09: 03:00:00 3 [IU] via SUBCUTANEOUS
  Administered 2020-07-09 (×2): 4 [IU] via SUBCUTANEOUS

## 2020-07-08 MED ORDER — INSULIN GLARGINE 100 UNIT/ML ~~LOC~~ SOLN
4.0000 [IU] | Freq: Two times a day (BID) | SUBCUTANEOUS | Status: DC
  Administered 2020-07-08: 10:00:00 4 [IU] via SUBCUTANEOUS
  Filled 2020-07-08: qty 0.04

## 2020-07-08 MED ORDER — INSULIN GLARGINE 100 UNIT/ML ~~LOC~~ SOLN
8.0000 [IU] | Freq: Two times a day (BID) | SUBCUTANEOUS | Status: DC
  Administered 2020-07-08 – 2020-07-09 (×2): 8 [IU] via SUBCUTANEOUS
  Filled 2020-07-08 (×3): qty 0.08

## 2020-07-08 MED ORDER — METHYLPREDNISOLONE SODIUM SUCC 40 MG IJ SOLR
40.0000 mg | INTRAMUSCULAR | Status: DC
  Administered 2020-07-08: 21:00:00 40 mg via INTRAVENOUS
  Filled 2020-07-08: qty 1

## 2020-07-08 MED ORDER — "THROMBI-PAD 3""X3"" EX PADS"
1.0000 | MEDICATED_PAD | Freq: Once | CUTANEOUS | Status: AC
  Administered 2020-07-08: 05:00:00 1 via TOPICAL
  Filled 2020-07-08: qty 1

## 2020-07-08 MED ORDER — SODIUM CHLORIDE 0.9 % IV BOLUS
1000.0000 mL | Freq: Once | INTRAVENOUS | Status: AC
  Administered 2020-07-08: 04:00:00 1000 mL via INTRAVENOUS

## 2020-07-08 MED ORDER — SODIUM CHLORIDE 0.9 % IV BOLUS
1000.0000 mL | Freq: Once | INTRAVENOUS | Status: AC
  Administered 2020-07-08: 02:00:00 1000 mL via INTRAVENOUS

## 2020-07-08 MED ORDER — SODIUM CHLORIDE 0.9 % IV BOLUS
1000.0000 mL | Freq: Once | INTRAVENOUS | Status: AC
  Administered 2020-07-08: 1000 mL via INTRAVENOUS

## 2020-07-08 NOTE — Progress Notes (Signed)
Daughter updated.  Encouraged DNR She will discuss w/ pt's wife  Erick Colace ACNP-BC [place] Pager # [phone] OR # [phone] if no answer

## 2020-07-08 NOTE — Progress Notes (Signed)
eLink Physician-Brief Progress Note Patient Name: Craig Fisher DOB: 02/10/1961 MRN: [id number]   Date of Service  07/08/2020  HPI/Events of Note  Oliguria - Urine output 100 mL for shift   eICU Interventions  Await CVP reading.     Intervention Category Major Interventions: Other:  [name] 07/08/2020, 5:59 AM

## 2020-07-08 NOTE — Progress Notes (Signed)
Daughter, Evelio Rueda called stating she had discussed code status with her mother, patient's wife, and they have come to the decision to change his code status to DNR. Dr. Melvyn Novas notified, requested verbal order be put in.

## 2020-07-08 NOTE — Progress Notes (Signed)
NAME:  Craig Fisher, MRN:  [id number], DOB:  [birth year], LOS: 22 ADMISSION DATE:  07/09/2020, CONSULTATION DATE:  12/19 REFERRING MD:  Triad, CHIEF COMPLAINT:  Worsening resp failure    Brief History:  59 yo hispanic male never smoker vaccinated but no booster w/in 6 m with onset of symptoms 12/1 admit 12/6 with covid pna much worse since 12/18 so transferred to step down for bipap pm 12/19 and pccm consulted.    Past Medical History:  Anemia, Blind left eye, Complication of anesthesia, Diabetes mellitus without complication ([place]), Diabetic retinopathy ([place]), ESRD (end stage renal disease) ([place]), Full dentures, Headache, Hyperlipidemia, Hypertension, Shortness of breath dyspnea, and Wears glasses.   Significant Hospital Events:  Transferred to SDU  12/19 IM in contact with his renal transplant team who are aware that he has been admitted to the hospital, agreed with admission to this facility, and advised holding the Myfortic and continuing Prograf. 12/20 acute resp failure. Intubated. Iatrogenic left PTX chest tube placed, starting prone positioning   12/21 multiple issues overnight including: Hypotensive requiring fluid resuscitation with crystalloid, antihypertensives stopped, placed on norepinephrine.  Continuous bleeding from chest tube and central line site.  Reinforced several times.Hemoglobin down from 11.1-7.7, transfused 2 units of FFP and 2 units blood; stopping anticoagulation.  Ventilator mechanics a little better.  PEEP and FiO2 requirements improved. Consults:  PCCM 12/19   Procedures:   OETT 12/20>>> Left [place] CVL 12/20>>> Left chest tube 12/20>>>  Significant Diagnostic Tests:  CT w/o contrast 07/02/20 extensive multifocal pulmonary infiltrates and consolidations small right effusion.  Micro Data:  BC x 2  12/6 neg MRSA PCR  12/19 neg   Antimicrobials/ covid 19 rx :  Baricitinib  12/16 >>> Valcyclovir 12/17 >>>off Vancomycin  12/19 >> discontinued 12/26 Maxepime   12/19 >>>  Interim History / Subjective:  Currently in the prone position.  Now on norepinephrine.  Objective   Blood pressure 136/61, pulse 74, temperature (Abnormal) 95.1 F (35.1 C), temperature source Rectal, resp. rate (Abnormal) 32, height 5\' 2"  (1.575 m), weight 56.6 kg, SpO2 97 %. CVP:  [0 mmHg-4 mmHg] 3 mmHg  Vent Mode: PRVC FiO2 (%):  [60 %-100 %] 60 % Set Rate:  [24 bmp-30 bmp] 30 bmp Vt Set:  [320 mL-430 mL] 320 mL PEEP:  [10 cmH20] 10 cmH20 Plateau Pressure:  [26 cmH20-43 cmH20] 26 cmH20   Intake/Output Summary (Last 24 hours) at 07/08/2020 0829 Last data filed at 07/08/2020 0600 Gross per 24 hour  Intake 5669.35 ml  Output 700 ml  Net 4969.35 ml   Filed Weights   07/05/20 0534 07/06/20 0500 07/08/20 0500  Weight: 54.4 kg 56.8 kg 56.6 kg    Examination: General sedated and prone. Oozing from central line and chest tube site. New suture to close chest tube insertion site placed HENT orally intubated Pulm dec bilaterally currently fio2 60%/peep 10 Vt at 6cc/kg sats 94% pplat is 24/ driving pressure 14 Left chest tube in place the new dressing is in place, clean and dry. The [place] system has bloody pleural fluid but minimal output over night in the system. There is no airleak.  Card RRR  abd soft Ext warm and dry Neuro sedated and getting intermittent NMB GU f/c w/ concentrated  [place] [facility] Problem list     Hyponatremia Nausea and vomiting  Assessment & Plan:  Acute Hypoxic resp failure secondary to covid pna and ARDS -FiO2 and PEEP requirements are improving, plateau pressure and driving pressures within  goal -completed Remdesivir  Plan Continue low tidal volume ventilation at 6 mL/kg predicted body weight  Plateau pressure goals less than 30, driving pressure goal less than 15 next  Continue to titrate PEEP/FiO2 for PaO2 goal greater than 65  Repeat arterial blood gas once placed in the prone position, continue proning if P/F ratio less than  150  We will keep chest tube to suction for another 24 hours, may be able to place to waterseal soon  We will be mindful of titration of PEEP up given recent pneumothorax, however has had no air leak for the last 48 hours.   RASS goal -4 As needed neuromuscular blockade for ventilator synchrony We will initiate steroid taper Day #7 baricitinib, discontinuing this given concern for septic shock Shanon Brow #3 of 5 cefepime, discontinue vancomycin on 12/20 Repeat chest x-ray once in the supine position Continue VAP bundle  Iatrogenic left ptx, continues to have oozing bloody output from around the chest tube, new suture placed ptx resolved. No airleak Plan We will continue to keep chest tube to suction for another 24 hours Repeat a.m. chest x-ray  Acute blood loss anemia He has had a significant drop in hemoglobin comparing 12/22 to 12/21 going from 12.7 down to 7.7.  Some of this is dilution effect given aggressive volume resuscitation however has had continuous oozing from chest tube and central line insertion sites now status post transfusion of both FFP and blood. plan Discontinue low molecular weight heparin Continue to trend CBC Transfusion trigger for hemoglobin less than 7 or ongoing active bleeding INR goal less than 1.5  Circulatory shock: Still has significant leukocytosis he has been afebrile, All cultures to date have been negative.  absolute neutrophil count is climbed.  Given his ongoing pressor requirements worried about septic shock as he is now been volume resuscitated Plan  baricitinib stopped Keep euvolemic Titrate norepi for MAP > 65 Culture blood abx as above Stop all antiihypertensives cxr once supine to ensure not tension (although I doubt this as ventilator mechanics would not support this currently)  Renal transplant w/ prior ESRD Urine output is declined overnight Plan Await chemistry  Avoid hypotension and volume depletion  Renal dose medications as  indicated  Strict intake output  Daily chemistries   Fluid electrolyte imbalance: Plan Awaiting a.m. chemistry   HL Plan Continue statin  Insulin dept DM Plan Continue sliding scale insulin Change Lantus to 4 units every 12   Best practice (evaluated daily)  Diet: tubefeeds 12/20, tolerating this Pain/Anxiety/Delirium protocol (if indicated): 12/20 started, placed on intermittent neuromuscular blockade on 12/20 VAP protocol (if indicated):12/20 DVT prophylaxis: LMWH, placed on hold 12/21 due to ongoing bleeding GI prophylaxis: PPI Glucose control: SSI, Lantus last adjusted 12/21 Mobility: prone position 12/20 Code status (family discussion pending) was not able to reach family on 12/20 will attempt again on 12/21 Disposition:ICU  Critical care time 32 minutes  Erick Colace ACNP-BC [place] Pager # [phone] OR # [phone] if no answer

## 2020-07-08 NOTE — Progress Notes (Addendum)
eLink Physician-Brief Progress Note Patient Name: Craig Fisher DOB: 04/28/1961 MRN: [id number]   Date of Service  07/08/2020  HPI/Events of Note  Hypotension - BP = 91/42 with MAP = 55. CVP = 1 ?  Suspect venodilation with warming from Temp = 94.1 F with subsequent hypotension.   eICU Interventions  Plan: 1. Bolus with 0.9 NaCl 1 liter IV over 1 hour now.      Intervention Category Major Interventions: Hypotension - evaluation and management  Dakota Vanwart Eugene 07/08/2020, 2:25 AM

## 2020-07-08 NOTE — Progress Notes (Signed)
eLink Physician-Brief Progress Note Patient Name: Craig Fisher DOB: [birth year] MRN: [id number]   Date of Service  07/08/2020  HPI/Events of Note  Hypotension - BP = 107/43 with MAP = 59. CVP = 4.   eICU Interventions  Plan: 1. Bolus with 0.9 NaCl 1 liter IV over 1 hour now.  2. Type and screen now.      Intervention Category Major Interventions: Hypotension - evaluation and management  Jermine Bibbee Eugene 07/08/2020, 3:15 AM

## 2020-07-08 NOTE — Progress Notes (Signed)
eLink Physician-Brief Progress Note Patient Name: Craig Fisher DOB: [birth year] MRN: [id number]   Date of Service  07/08/2020  HPI/Events of Note  Continues to bleed from chest tube site. Hgb = 12.7 --> 11.1. BP = 93/44 with MAP + 60. Temp = 94.1 PT/INR = 15.4/1.3 and PTT = 42 (both slightly elevated). Of note, hypothermia can make a patient coagulopathic.  eICU Interventions  Plan: 1. Bair Hugger PRN. 2. Transfuse 2 units FFP now.  3. Already holding Lovenox until patient re-evaluated by rounding team in the AM.      Intervention Category Major Interventions: Hemorrhage - evaluation and management  Margaurite Salido Eugene 07/08/2020, 1:14 AM

## 2020-07-08 NOTE — Progress Notes (Signed)
eLink Physician-Brief Progress Note Patient Name: Craig Fisher DOB: 04-Apr-1961 MRN: [id number]   Date of Service  07/08/2020  HPI/Events of Note  Hypotension - BP = 94/43 with MAP = 58 on ceiling dose of Norepinephrine IV infusion.   eICU Interventions  Plan: 1. Bolus with 0.9 NaCl 1 liter IV over 1 hour.  2. Transfuse 2 units PRBC. Blood bank at [facility] called for urgent release.  3. Will ask the PCCM ground team to evaluate the patient at bedside.  4. DIC panel after 1st unit FFP.      Intervention Category Major Interventions: Hemorrhage - evaluation and management  Edythe Riches Eugene 07/08/2020, 4:04 AM

## 2020-07-08 NOTE — Progress Notes (Signed)
PHARMACY NOTE:  ANTIMICROBIAL RENAL DOSAGE ADJUSTMENT  Current antimicrobial regimen includes a mismatch between antimicrobial dosage and estimated renal function. As per policy approved by the Pharmacy & Therapeutics and Medical Executive Committees, the antimicrobial dosage will be adjusted accordingly.  Current antimicrobial and dosage:  Cefepime 2g IV q8 hr  Indication: pneumonia  Renal Function:   Estimated Creatinine Clearance: 32.5 mL/min (A) (by C-G formula based on SCr of 1.89 mg/dL (H)). []      On intermittent HD, scheduled: []      On CRRT    Antimicrobial dosage has been changed to:  2 mg IV q12 hr   Additional Comments: Meds reviewed with SCr doubling  Cefepime adjusted as above  May become more insulin sensitive  Fentanyl an appropriate choice for analgesia in renal impairment  No other adjustments needed  Do not recommend checking tacrolimus level unless SCr stabilizes above baseline as this level takes 4+ days to result   Thank you for allowing pharmacy to be a part of this patient's care.  Reuel Boom, PharmD, BCPS [phone] 07/08/2020, 8:48 PM

## 2020-07-08 NOTE — Progress Notes (Signed)
Critical care brief note  Called by easily to evaluate patient for bleeding from chest tube site.  On arrival found evidence of previous bleeding and saturated sheets.  Dressing was taken down there is no evidence of ongoing bleeding.  The chest tube site appeared to be dry and intact.  Suspect that there may be a small skin vessel bleeding.  Thrombin dressing was applied gauze dressing was applied over the thrombin dressing.  Tegaderms were used to make a modified compression dressing.  We will continue to follow clinically.  No need for surgical intervention at this time.

## 2020-07-08 NOTE — Progress Notes (Signed)
PT PLACED ON 7CC (382) PER PETE. DECISION MADE DUE TO ABG. PT WILL BE PRONED AT 1730

## 2020-07-08 NOTE — Progress Notes (Signed)
CRITICAL VALUE ALERT  Critical Value:  PH 7.154  Date & Time Notied: 07/08/2020 1225  Provider Notified: Marni Griffon  Orders Received/Actions taken: Continue proneing schedule. Ventilator settings adjusted

## 2020-07-09 DIAGNOSIS — J80 Acute respiratory distress syndrome: Secondary | ICD-10-CM

## 2020-07-09 DIAGNOSIS — J95811 Postprocedural pneumothorax: Secondary | ICD-10-CM

## 2020-07-09 LAB — TRIGLYCERIDES: Triglycerides: 209 mg/dL — ABNORMAL HIGH (ref <150)

## 2020-07-09 LAB — TYPE AND SCREEN
ABO/RH(D): A POS
Antibody Screen: NEGATIVE
Unit division: 0
Unit division: 0

## 2020-07-09 LAB — BPAM RBC
Blood Product Expiration Date: 202201112359
Blood Product Expiration Date: 202201112359
ISSUE DATE / TIME: 202112210643
ISSUE DATE / TIME: 202112210920
Unit Type and Rh: 6200
Unit Type and Rh: 6200

## 2020-07-09 LAB — BPAM FFP
Blood Product Expiration Date: 202112220305
Blood Product Expiration Date: 202112222359
ISSUE DATE / TIME: 202112210330
ISSUE DATE / TIME: 202112210444
Unit Type and Rh: 6200
Unit Type and Rh: 6200

## 2020-07-09 LAB — CBC
HCT: 36.1 % — ABNORMAL LOW (ref 39.0–52.0)
Hemoglobin: 11.7 g/dL — ABNORMAL LOW (ref 13.0–17.0)
MCH: 28.3 pg (ref 26.0–34.0)
MCHC: 32.4 g/dL (ref 30.0–36.0)
MCV: 87.4 fL (ref 80.0–100.0)
Platelets: 89 10*3/uL — ABNORMAL LOW (ref 150–400)
RBC: 4.13 MIL/uL — ABNORMAL LOW (ref 4.22–5.81)
RDW: 15.6 % — ABNORMAL HIGH (ref 11.5–15.5)
WBC: 34.8 10*3/uL — ABNORMAL HIGH (ref 4.0–10.5)
nRBC: 0.1 % (ref 0.0–0.2)

## 2020-07-09 LAB — COMPREHENSIVE METABOLIC PANEL
ALT: 29 U/L (ref 0–44)
AST: 28 U/L (ref 15–41)
Albumin: 1.9 g/dL — ABNORMAL LOW (ref 3.5–5.0)
Alkaline Phosphatase: 68 U/L (ref 38–126)
Anion gap: 9 (ref 5–15)
BUN: 78 mg/dL — ABNORMAL HIGH (ref 6–20)
CO2: 16 mmol/L — ABNORMAL LOW (ref 22–32)
Calcium: 6.9 mg/dL — ABNORMAL LOW (ref 8.9–10.3)
Chloride: 116 mmol/L — ABNORMAL HIGH (ref 98–111)
Creatinine, Ser: 2.55 mg/dL — ABNORMAL HIGH (ref 0.61–1.24)
GFR, Estimated: 28 mL/min — ABNORMAL LOW (ref >60)
Glucose, Bld: 164 mg/dL — ABNORMAL HIGH (ref 70–99)
Potassium: 4.9 mmol/L (ref 3.5–5.1)
Sodium: 141 mmol/L (ref 135–145)
Total Bilirubin: 0.5 mg/dL (ref 0.3–1.2)
Total Protein: 4.4 g/dL — ABNORMAL LOW (ref 6.5–8.1)

## 2020-07-09 LAB — GLUCOSE, CAPILLARY
Glucose-Capillary: 198 mg/dL — ABNORMAL HIGH (ref 70–99)
Glucose-Capillary: 135 mg/dL — ABNORMAL HIGH (ref 70–99)
Glucose-Capillary: 158 mg/dL — ABNORMAL HIGH (ref 70–99)
Glucose-Capillary: 155 mg/dL — ABNORMAL HIGH (ref 70–99)

## 2020-07-09 LAB — BLOOD GAS, ARTERIAL
Acid-base deficit: 12.6 mmol/L — ABNORMAL HIGH (ref 0.0–2.0)
Bicarbonate: 16 mmol/L — ABNORMAL LOW (ref 20.0–28.0)
Drawn by: 331471
FIO2: 55
MECHVT: 380 mL
O2 Saturation: 91.3 %
PEEP: 10 cmH2O
Patient temperature: 98.6
RATE: 30 resp/min
pCO2 arterial: 50.1 mmHg — ABNORMAL HIGH (ref 32.0–48.0)
pH, Arterial: 7.132 — CL (ref 7.350–7.450)
pO2, Arterial: 69.1 mmHg — ABNORMAL LOW (ref 83.0–108.0)

## 2020-07-09 LAB — PREPARE FRESH FROZEN PLASMA: Unit division: 0

## 2020-07-09 MED ORDER — PANTOPRAZOLE SODIUM 40 MG PO PACK
40.0000 mg | PACK | Freq: Every day | ORAL | Status: DC
  Administered 2020-07-09: 10:00:00 40 mg
  Filled 2020-07-09: qty 20

## 2020-07-09 MED ORDER — SODIUM CHLORIDE 0.9 % IV SOLN: 2.0000 g | INTRAVENOUS | Status: DC

## 2020-07-09 MED ORDER — VALACYCLOVIR HCL 500 MG PO TABS: 500.0000 mg | ORAL_TABLET | Freq: Every day | ORAL | Status: DC

## 2020-07-09 NOTE — Progress Notes (Signed)
eLink Physician-Brief Progress Note Patient Name: Vitaly Wanat DOB: 1961/01/07 MRN: [id number]   Date of Service  07/09/2020  HPI/Events of Note  Patient has had 3 liquid stools and is proned. Nursing request for Flexiseal.   eICU Interventions  Plan: 1. Place Flexiseal.      Intervention Category Major Interventions: Other:  Eugen Jeansonne Cornelia Copa 07/09/2020, 6:06 AM

## 2020-07-09 NOTE — Progress Notes (Signed)
CRITICAL VALUE ALERT  Critical Value:  PH 7.13  Date & Time Notied:  07/09/2020 1200  Provider Notified: Marni Griffon  Orders Received/Actions taken: Continue to monitor, patient may be transitioning to comfort care. Holding off on proning

## 2020-07-09 NOTE — Progress Notes (Signed)
Spoke w/ family they are now at bedside. They are in agreement to transition to comfort.  Plan Stopping all labs Stop pressors Family arraignments being made  Extubate later today w/ plan for compassionate one way extubation  Erick Colace ACNP-BC [place] Pager # [phone] OR # [phone] if no answer

## 2020-07-09 NOTE — Progress Notes (Addendum)
NAME:  Craig Fisher, MRN:  [id number], DOB:  02-25-1961, LOS: 66 ADMISSION DATE:  06/25/2020, CONSULTATION DATE:  12/19 REFERRING MD:  [facility], CHIEF COMPLAINT:  Worsening resp failure    Brief History:  59 y/o hispanic male, never smoker, vaccinated but no booster w/in 6 m with onset of symptoms 12/1 admit 12/6 with COVID PNA.  Worsened 12/18 so transferred to step down for bipap pm 12/19 and PCCM consulted.   Past Medical History:  Anemia, Blind left eye, Complication of anesthesia, Diabetes mellitus without complication ([place]), Diabetic retinopathy ([place]), ESRD (end stage renal disease) ([place]), Full dentures, Headache, Hyperlipidemia, Hypertension, Shortness of breath dyspnea, and Wears glasses.  Significant Hospital Events:  12/06 Admit  12/19 Tx to SDU, with worsening respiratory failure  12/19 IM contacted renal transplant team / aware of admit, advised holding the Myfortic and continuing Prograf. 12/20 acute resp failure. Intubated. Iatrogenic left PTX chest tube placed, starting prone positioning   12/21 Hypotensive requiring IVF, antihypertensives stopped, placed on norepinephrine.  Continuous bleeding from chest tube and central line site. Hemoglobin down from 11.1-7.7, transfused 2 units of FFP and 2 units blood; stopping anticoagulation.  Ventilator mechanics a little better.  PEEP and FiO2 requirements improved. 12/22 PEEP 10 / FiO2 55%, Peak 40-42, Pplat 38  Consults:  PCCM 12/19   Procedures:  OETT 12/20 >> Left  CVL 12/20 >> Left chest tube 12/20 >>  Significant Diagnostic Tests:   CT w/o contrast 12/15 >> extensive multifocal pulmonary infiltrates and consolidations small right effusion.  Micro Data:  BCx2 12/6 >> neg MRSA PCR 12/19 >> neg   Antimicrobials / COVID Rx   Baricitinib  12/16 >> Valcyclovir 12/17 >>  Vancomycin  12/19 >> discontinued 12/26 Maxepime  12/19 >>  Interim History / Subjective:  Pt turned to supine position 0930 Afebrile  On levophed at  22 mcg Remains on propofol, fentanyl for sedation/pain, PRN NBM pushes  Vent - 55%, PEEP 10, Peak 40-42 / Pplat 38, driving pressure 28 Glucose 135-164 Afebrile / WBC increased to 34.8  I/O 280 ml UOP, 3.2L+ in last 24 hours  Objective   Blood pressure (!) 184/55, pulse 84, temperature 98.8 F (37.1 C), temperature source Axillary, resp. rate (!) 26, height 5\' 2"  (1.575 m), weight 60 kg, SpO2 91 %.    Vent Mode: PRVC FiO2 (%):  [55 %-60 %] 55 % Set Rate:  [30 bmp] 30 bmp Vt Set:  [320 mL-380 mL] 380 mL PEEP:  [10 cmH20] 10 cmH20 Plateau Pressure:  [25 cmH20-32 cmH20] 31 cmH20   Intake/Output Summary (Last 24 hours) at 07/09/2020 0932 Last data filed at 07/09/2020 6789 Gross per 24 hour  Intake 3309.09 ml  Output 620 ml  Net 2689.09 ml   Filed Weights   07/06/20 0500 07/08/20 0500 07/09/20 0321  Weight: 56.8 kg 56.6 kg 60 kg    Examination: General: critically ill appearing adult male lying in bed in NAD on vent  HEENT: MM pink/moist, scant bloody secretions from nares, gastric tube in place, scleral edema, mild icterus, small circular dry patch over right eye Neuro: sedate CV: s1s2 RRR, no m/r/g PULM: non-labored on vent, vent assisted breath sounds bilaterally, left chest tube in place, re-dressed with small amount oozing from site, intermittent 2-3/7 air leak from chest tube GI: soft, bsx4 active  Extremities: warm/dry, 1+ dependent edema  Skin: pressure area / excoriation of penis noted  Resolved Hospital Problem list    Hyponatremia Nausea and vomiting   Assessment &  Plan:   Acute Hypoxic Resp Failure secondary to COVID PNA with subsequent ARDS Completed Remdesivir.  Completed 7 days baricitinib.  -low Vt ventilation 4-8cc/kg -goal plateau pressure <30, driving pressure <01 cm H2O -target PaO2 55-65, titrate PEEP/FiO2 per ARDS protocol  -follow up ABG post supine positioning -if P/F ratio <150, consider prone therapy for 16 hours per day -goal CVP <4, diuresis  as necessary -VAP prevention measures  -continue cefepime -follow intermittent CXR  -solumdedrol 40mg  IV QD, taper to off pending O2 response  Iatrogenic Left Pneumothorax  Oozing bloody output from around the chest tube 12/21, new suture placed with improvement 12/22.  -follow CXR  -continues to have intermittent air leak  -continue suction -20cm   Acute Blood Loss Anemia Thrombocytopenia  Significant drop in hemoglobin 12/21 going from 12.7 down to 7.7.  Some of this is dilution effect given aggressive volume resuscitation however has had continuous oozing from chest tube and central line insertion sites now status post transfusion of both FFP and blood. -monitor off heparin  -follow CBC  -transfuse for Hgb <7% or ongoing active bleeding concerns   Circulatory Shock Leukocytosis  Has significant leukocytosis but has been afebrile. All cultures to date have been negative.  ANC is climbing.  Given his ongoing pressor requirements worried about septic shock + sedation effect as he is now been volume resuscitated. Baricitinib stopped 12/21.  -wean levophed for MAP >65 -euvolemia goal  -follow repeat cultures  -hold antihypertensives   Renal Transplant w/ prior ESRD AKI Mixed Metabolic / Respiratory Acidosis UOP declined 12/22 with worsening renal indices  -continue valtrex, renal dosing  -continue tacrolimus, consider holding   -recommended no hemodialysis in the event of need and family in agreement  -Trend BMP / urinary output -Replace electrolytes as indicated -Avoid nephrotoxic agents, ensure adequate renal perfusion  HL -continue statin   IDDM  -SSI, resistant scale  -lantus 8 units BID   Moderate Protein Calorie Malnutrition  -continue TF  Best practice (evaluated daily)  Diet: TF Pain/Anxiety/Delirium protocol (if indicated): PAD protocol in place VAP protocol (if indicated):12/20 DVT prophylaxis: LMWH, placed on hold 12/21 due to ongoing bleeding GI  prophylaxis: PPI Glucose control: SSI, Lantus  Mobility: as tolerated Code status: DNR.   Disposition: ICU Family Update: 12/22 updated daughter via phone. Reviewed decline in renal function, limited UOP, acidosis and changes in ventilator mechanics.  Discussed concerns that despite maximum support he is declining.  Discussed concept of hemodialysis and family well aware as he used in before his transplant. Recommended no HD if further decline and family in agreement.  Will work to coordinate family visit outside door today if possible as he is not out of his 21 day window.    Critical care time:  65 minutes   Noe Gens, MSN, NP-C, AGACNP-BC [facility] 07/09/2020, 9:34 AM   Please see [email or web address] for pager details.

## 2020-07-09 NOTE — Progress Notes (Addendum)
PHARMACY NOTE:  ANTIMICROBIAL RENAL DOSAGE ADJUSTMENT  Current antimicrobial regimen includes a mismatch between antimicrobial dosage and estimated renal function. As per policy approved by the Pharmacy & Therapeutics and Medical Executive Committees, the antimicrobial dosage will be adjusted accordingly.   Current antimicrobial and dosage:  Cefepime 2g IV q12hr Valtrex 500 per tube bid for VZV px  Indication: pneumonia; VZV px  Renal Function:   Estimated Creatinine Clearance: 24.1 mL/min (A) (by C-G formula based on SCr of 2.55 mg/dL (H)). []      On intermittent HD, scheduled: []      On CRRT    Antimicrobial dosage has been changed to: Cefepime 2 gm  IV q24 hr; Valtrex 500 mg per tube daily  IV PPI> per tube   Thank you for allowing pharmacy to be a part of this patient's care Eudelia Bunch, Pharm.D 07/09/2020 8:55 AM

## 2020-07-13 LAB — CULTURE, BLOOD (ROUTINE X 2)
Culture: NO GROWTH
Culture: NO GROWTH

## 2020-07-15 ENCOUNTER — Telehealth: Payer: Self-pay | Admitting: Internal Medicine

## 2020-07-16 NOTE — Telephone Encounter (Signed)
Lauren/Patrice, not sure what needs to be done with this message. Send back to triage if we can help.

## 2020-07-16 NOTE — Telephone Encounter (Signed)
Dr. Melvyn Novas has done what needs to be done with this. Nothing further needed at this time.

## 2020-07-19 NOTE — Progress Notes (Signed)
eLink Physician-Brief Progress Note Patient Name: Taelon Bendorf DOB: 13-Apr-1961 MRN: [id number]   Date of Service  07/18/2020  HPI/Events of Note  Bed side RN asking for, RN may pronounce. Camera eval done. Family at bed side.    eICU Interventions  DNR. Comfort care.  RN May pronounce ordered.      Intervention Category Minor Interventions: Other:  Elmer Sow Jul 18, 2020, 2:10 AM

## 2020-07-19 NOTE — Death Summary Note (Signed)
DEATH SUMMARY   Patient Details  Name: Craig Fisher MRN: [id number] DOB: [birth year]  Admission/Discharge Information   Admit Date:  2020-07-16  Date of Death: Date of Death: 2020/08/02  Time of Death: Time of Death: 0146  Length of Stay: 26-Oct-2022  Referring Physician: Sharion Balloon, FNP   Reason(s) for Hospitalization   Acute Hypoxic Resp Failure secondary to COVID PNA with subsequent ARDS Iatrogenic Left Pneumothorax  Acute Blood Loss Anemia Thrombocytopenia  Circulatory Shock Leukocytosis  Renal Transplant w/ prior ESRD AKI Mixed Metabolic / Respiratory Acidosis  Diagnoses  Preliminary cause of death:  Acute hypoxemic respiratory failure due to COVID-19 [facility]) Secondary Diagnoses (including complications and co-morbidities):  Principal Problem:      Type 2 diabetes mellitus with diabetic nephropathy (HCC)   Abdominal pain with vomiting   Renal transplant recipient   Hyponatremia   Chronic kidney disease, stage 3a (HCC)   Hypertensive urgency   Tension pneumothorax   Postprocedural pneumothorax   Circulatory shock     Acute Renal failure    Metabolic Acidosis     IDDM    Acute blood loss anemia     Thrombocytopenia   Brief Hospital Course (including significant findings, care, treatment, and services provided and events leading to death)  Craig Fisher is a 60 y.o. year old male   2023/07/17 Admit  12/19 Tx to SDU, with worsening respiratory failure  12/19 IM contacted renal transplant team / aware of admit, advised holding the Myfortic and continuing Prograf. 12/20 acute resp failure. Intubated. Iatrogenic left PTX chest tube placed, starting prone positioning   12/21 Hypotensive requiring IVF, antihypertensives stopped, placed on norepinephrine.  Continuous bleeding from chest tube and central line site. Hemoglobin down from 11.1-7.7, transfused 2 units of FFP and 2 units blood; stopping anticoagulation.  Ventilator mechanics a little better but clinically worse. Family agreed  to DNR 12/22 PEEP 10 / FiO2 55%, Peak 40-42, Pplat 38; renal function worse. Family came to bedside. Indicated that they don't want him to suffer. Had already decided not to escalate care and not add dialysis.  08/03/23 ventilator removed per request of family. Patient progressed to respiratory followed by cardiac arrest.   Pertinent Labs and Studies  Significant Diagnostic Studies CT CHEST WO CONTRAST  Result Date: 07/02/2020 CLINICAL DATA:  Respiratory failure, COVID-19 pneumonia, hypoxia EXAM: CT CHEST WITHOUT CONTRAST TECHNIQUE: Multidetector CT imaging of the chest was performed following the standard protocol without IV contrast. COMPARISON:  12/09/2016 FINDINGS: Cardiovascular: Moderate multi-vessel coronary artery calcification, progressive since prior examination. Global cardiac size within normal limits. No pericardial effusion. Central pulmonary arteries are of normal caliber. Mild atherosclerotic calcification within the thoracic aorta. No aortic aneurysm. Mediastinum/Nodes: Thyroid unremarkable. No pathologic thoracic adenopathy. Esophagus unremarkable. Lungs/Pleura: Extensive, diffuse, slightly asymmetric ground-glass pulmonary infiltrate and dependent pulmonary consolidation is identified in keeping with atypical infection or inflammation. Small right pleural effusion is present. No pneumothorax. No central obstructing mass. Upper Abdomen: The kidneys are atrophic in keeping with chronic renal failure. There is extensive arteriosclerosis involving the visualized visceral vasculature. No acute abnormality. Musculoskeletal: No acute bone abnormality. IMPRESSION: Extensive multifocal pulmonary infiltrate and consolidation most in keeping with atypical infection in the acute setting and compatible with the given history of COVID-19 pneumonia. Small right pleural effusion, slightly unusual in the setting of COVID-19 pneumonia, but possibly related to superimposed end-stage renal disease. Moderate  coronary artery calcification, progressive since prior examination. Aortic Atherosclerosis (ICD10-I70.0). Electronically Signed   By: Fidela Salisbury MD  On: 07/02/2020 19:30   CT ABDOMEN PELVIS W CONTRAST  Result Date: 07/15/2020 CLINICAL DATA:  Abdominal pain and fever. EXAM: CT ABDOMEN AND PELVIS WITH CONTRAST TECHNIQUE: Multidetector CT imaging of the abdomen and pelvis was performed using the standard protocol following bolus administration of intravenous contrast. CONTRAST:  165mL OMNIPAQUE IOHEXOL 300 MG/ML  SOLN COMPARISON:  None. FINDINGS: Lower chest: Marked severity multifocal infiltrates are seen throughout the bilateral lung bases. Hepatobiliary: There is mild diffuse fatty infiltration of the liver parenchyma. No focal liver abnormality is seen. No gallstones, gallbladder wall thickening, or biliary dilatation. Pancreas: Unremarkable. No pancreatic ductal dilatation or surrounding inflammatory changes. Spleen: Normal in size without focal abnormality. Adrenals/Urinary Tract: Adrenal glands are unremarkable. The native kidneys are atrophic in appearance, without renal calculi, focal lesion, or hydronephrosis. A normal appearing renal transplant is seen within the pelvis on the right. Bladder is unremarkable. Stomach/Bowel: Stomach is within normal limits. Appendix appears normal. No evidence of bowel wall dilatation. Noninflamed diverticula are seen throughout the large bowel. Vascular/Lymphatic: Aortic atherosclerosis. No enlarged abdominal or pelvic lymph nodes. Reproductive: The prostate gland is mildly enlarged. Other: No abdominal wall hernia or abnormality. No abdominopelvic ascites. Musculoskeletal: A chronic compression fracture deformity is seen at the level of L1. IMPRESSION: 1. Marked severity multifocal bibasilar infiltrates. 2. Fatty liver. 3. Colonic diverticulosis. 4. Normal appearing renal transplant within the pelvis on the right. 5. Chronic compression fracture deformity of the L1  vertebral body. 6. Aortic atherosclerosis. Aortic Atherosclerosis (ICD10-I70.0). Electronically Signed   By: Virgina Norfolk M.D.   On: 06/30/2020 19:14   DG Chest Port 1 View  Result Date: 07/08/2020 CLINICAL DATA:  Acute respiratory failure.  COVID. EXAM: PORTABLE CHEST 1 VIEW COMPARISON:  Chest x-ray 07/07/2020. FINDINGS: Endotracheal tube tip noted 1 cm above the lower portion of the carina. Proximal repositioning of approximately 2-3 cm suggested. NG tube and left subclavian line in stable position. Left chest tube in stable position. No pneumothorax noted on today's exam. Interim resolution of left chest wall subcutaneous emphysema. Heart size normal. Diffuse bilateral pulmonary infiltrates/edema again noted. Similar findings on prior exam. Low lung volumes with persistent bibasilar atelectasis. Right base lobe atelectasis has progressed from prior exam. Small right pleural effusion. IMPRESSION: 1. Endotracheal tube tip noted 1 cm above the lower portion of the carina. Proximal repositioning of approximately 2-3 cm suggested. NG tube and left subclavian line stable position. Left chest tube in stable position. No pneumothorax noted on today's exam. Improved left chest wall subcutaneous emphysema. 2. Diffuse bilateral pulmonary infiltrates/edema again noted. Low lung volumes with persistent bibasilar atelectasis. Progressive right base atelectasis. Small right pleural effusion. Electronically Signed   By: [place]   On: 07/08/2020 13:04   DG CHEST PORT 1 VIEW  Result Date: 07/07/2020 CLINICAL DATA:  Central line placement. EXAM: PORTABLE CHEST 1 VIEW COMPARISON:  07/07/2020. FINDINGS: Endotracheal tube tip now noted 3 cm above the carina in good anatomic position. Left subclavian line noted with tip over SVC. NG tube noted with tip over the stomach. Heart size normal. Diffuse bilateral pulmonary infiltrates/edema again noted. No interim change. No pleural effusion or pneumothorax.  IMPRESSION: 1. Endotracheal tube tip now noted 3 cm above the carina in good anatomic position. Left subclavian line noted with tip over SVC. NG tube noted with tip over stomach. 2. Diffuse bilateral pulmonary infiltrates/edema again noted. No interim change. Electronically Signed   By: Marcello Moores  Register   On: 07/07/2020 06:41   DG CHEST PORT  1 VIEW  Result Date: 07/07/2020 CLINICAL DATA:  Intubation. EXAM: PORTABLE CHEST 1 VIEW COMPARISON:  07/07/2020. FINDINGS: Endotracheal tube tip noted 9 mm above the lower portion of the carina. Proximal repositioning of approximately 2 cm should be considered. Heart size normal. Diffuse bilateral pulmonary infiltrates/edema again noted without interim change. No pleural effusion or pneumothorax. IMPRESSION: 1. Endotracheal tube tip noted 9 mm above the lower portion of the carina. Proximal repositioning of approximately 2 cm should be considered. 2. Diffuse bilateral pulmonary infiltrates/edema again noted without interim change. Electronically Signed   By: Marcello Moores  Register   On: 07/07/2020 05:16   DG CHEST PORT 1 VIEW  Result Date: 07/07/2020 CLINICAL DATA:  Shortness of breath. EXAM: PORTABLE CHEST 1 VIEW COMPARISON:  July 06, 2020 FINDINGS: Persistent infiltrates are seen involving predominantly the mid and lower lung fields, bilaterally. This is very mildly decreased in severity when compared to the prior exam. There is no evidence of a pleural effusion or pneumothorax. The heart size and mediastinal contours are within normal limits. The subcutaneous emphysema seen within the right chest wall on the prior study is no longer visualized. The visualized skeletal structures are unremarkable. IMPRESSION: Persistent bilateral infiltrates, very mildly decreased in severity when compared to the prior exam. Electronically Signed   By: Virgina Norfolk M.D.   On: 07/07/2020 03:39   DG CHEST PORT 1 VIEW  Result Date: 07/06/2020 CLINICAL DATA:  Shortness of breath.  Acute hypoxemic respiratory failure due to COVID 19. Renal transplant patient. EXAM: PORTABLE CHEST 1 VIEW COMPARISON:  07/01/2020 FINDINGS: Heart size is stable. Severe diffuse bilateral pulmonary airspace disease shows interval worsening mainly in the left lung since prior study. No evidence of pleural effusion. Subcutaneous emphysema is seen in the right chest wall, however no pneumothorax is visualized. IMPRESSION: Severe diffuse bilateral pulmonary airspace disease, with interval worsening mainly in the left lung. Right chest wall subcutaneous emphysema. No pneumothorax visualized. Electronically Signed   By: Marlaine Hind M.D.   On: 07/06/2020 04:32   DG CHEST PORT 1 VIEW  Result Date: 07/01/2020 CLINICAL DATA:  Hypoxia and COVID-19 pneumonia. EXAM: PORTABLE CHEST 1 VIEW COMPARISON:  07/09/2020 FINDINGS: Stable heart size. Increased density of confluent airspace disease in the right mid and lower lung zones. Airspace disease of the left lower lung appears relatively stable by chest x-ray. No pneumothorax or visible pleural fluid. IMPRESSION: Increased density of confluent airspace disease in the right mid and lower lung zones. Stable left lower lung airspace disease. Electronically Signed   By: Aletta Edouard M.D.   On: 07/01/2020 09:03   DG Chest Port 1 View  Result Date: 07/04/2020 CLINICAL DATA:  60 year old male with shortness of breath, positive COVID-19 times 10 days. EXAM: PORTABLE CHEST 1 VIEW COMPARISON:  [facility] Chest CTA 12/09/2016 and earlier. FINDINGS: Portable AP semi upright view at 1524 hours. Lung volumes and mediastinal contours appear stable since 2018 and within normal limits. There is hazy and indistinct opacity throughout much of the right lung, most confluent at the right lung base. Similar Patchy and confluent left lung base opacity. Visualized tracheal air column is within normal limits. No pneumothorax. No pleural effusion is evident. No acute osseous abnormality  identified. IMPRESSION: Asymmetric right > left patchy and indistinct pulmonary opacity compatible with COVID-19 pneumonia in this setting. Electronically Signed   By: Genevie Ann M.D.   On: 07/12/2020 15:42   VAS [place] LOWER EXTREMITY VENOUS (DVT)  Result Date: 06/30/2020  Lower Venous  DVT Study Indications: Covid, elevated ddimer.  Comparison Study: no prior Performing Technologist: Abram Sander RVS  Examination Guidelines: A complete evaluation includes B-mode imaging, spectral Doppler, color Doppler, and power Doppler as needed of all accessible portions of each vessel. Bilateral testing is considered an integral part of a complete examination. Limited examinations for reoccurring indications may be performed as noted. The reflux portion of the exam is performed with the patient in reverse Trendelenburg.  +---------+---------------+---------+-----------+----------+-------------------+ RIGHT    CompressibilityPhasicitySpontaneityPropertiesThrombus Aging      +---------+---------------+---------+-----------+----------+-------------------+ CFV      Full           Yes      Yes                                      +---------+---------------+---------+-----------+----------+-------------------+ SFJ      Full                                                             +---------+---------------+---------+-----------+----------+-------------------+ FV Prox  Full                                                             +---------+---------------+---------+-----------+----------+-------------------+ FV Mid   Full                                                             +---------+---------------+---------+-----------+----------+-------------------+ FV DistalFull                                                             +---------+---------------+---------+-----------+----------+-------------------+ PFV      Full                                                              +---------+---------------+---------+-----------+----------+-------------------+ POP      Full           Yes      Yes                                      +---------+---------------+---------+-----------+----------+-------------------+ PTV      Full                                                             +---------+---------------+---------+-----------+----------+-------------------+  PERO                                                  Not well visualized +---------+---------------+---------+-----------+----------+-------------------+   +---------+---------------+---------+-----------+----------+--------------+ LEFT     CompressibilityPhasicitySpontaneityPropertiesThrombus Aging +---------+---------------+---------+-----------+----------+--------------+ CFV      Full           Yes      Yes                                 +---------+---------------+---------+-----------+----------+--------------+ SFJ      Full                                                        +---------+---------------+---------+-----------+----------+--------------+ FV Prox  Full                                                        +---------+---------------+---------+-----------+----------+--------------+ FV Mid   Full                                                        +---------+---------------+---------+-----------+----------+--------------+ FV DistalFull                                                        +---------+---------------+---------+-----------+----------+--------------+ PFV      Full                                                        +---------+---------------+---------+-----------+----------+--------------+ POP      Full           Yes      Yes                                 +---------+---------------+---------+-----------+----------+--------------+ PTV      Full                                                         +---------+---------------+---------+-----------+----------+--------------+ PERO     Full                                                        +---------+---------------+---------+-----------+----------+--------------+  Summary: BILATERAL: - No evidence of deep vein thrombosis seen in the lower extremities, bilaterally. - No evidence of superficial venous thrombosis in the lower extremities, bilaterally. -No evidence of popliteal cyst, bilaterally.   *See table(s) above for measurements and observations. Electronically signed by [name] MD on 06/30/2020 at 7:42:49 PM.    Final    [place] EKG SITE RITE  Result Date: 07/08/2020 If Site Rite image not attached, placement could not be confirmed due to current cardiac rhythm.   Microbiology Recent Results (from the past 240 hour(s))  MRSA PCR Screening     Status: None   Collection Time: 07/06/20  7:36 AM   Specimen: Nasal Mucosa; Nasopharyngeal  Result Value Ref Range Status   MRSA by PCR NEGATIVE NEGATIVE Final    Comment:        The GeneXpert MRSA Assay (FDA approved for NASAL specimens only), is one component of a comprehensive MRSA colonization surveillance program. It is not intended to diagnose MRSA infection nor to guide or monitor treatment for MRSA infections. Performed at [facility], [place] [address] [place], [place] [address]   Culture, blood (Routine X 2) w Reflex to ID Panel     Status: None (Preliminary result)   Collection Time: 07/08/20 10:01 AM   Specimen: BLOOD RIGHT HAND  Result Value Ref Range Status   Specimen Description   Final    BLOOD RIGHT HAND Performed at [facility] [address], [place], [place] [address]    Special Requests   Final    BOTTLES DRAWN AEROBIC ONLY Blood Culture results may not be optimal due to an inadequate volume of blood received in culture bottles Performed at [facility] [address] [place], [place]  [address]    Culture   Final    NO GROWTH 2 DAYS Performed at [facility] [address] [place], [place] [address]    Report Status PENDING  Incomplete  Culture, blood (Routine X 2) w Reflex to ID Panel     Status: None (Preliminary result)   Collection Time: 07/08/20 10:02 AM   Specimen: BLOOD RIGHT HAND  Result Value Ref Range Status   Specimen Description   Final    BLOOD RIGHT HAND Performed at [facility] [address], [place], [place] [address]    Special Requests   Final    BOTTLES DRAWN AEROBIC ONLY Blood Culture results may not be optimal due to an inadequate volume of blood received in culture bottles Performed at [facility] [address], [place], [place] [address]    Culture   Final    NO GROWTH 2 DAYS Performed at [facility] [address], [place], [place] [address]    Report Status PENDING  Incomplete    Lab Basic Metabolic Panel: Recent Labs  Lab 07/06/20 0350 07/07/20 0217 07/07/20 1159 07/07/20 2338 07/08/20 0509 07/08/20 1437 07/08/20 1700 07/09/20 0325  NA 135  136 137 137  --   --  138  --  141  K 5.3*  5.3* 5.0 4.8  --   --  4.8  --  4.9  CL 101  102 106 105  --   --  113*  --  116*  CO2 24  24 21* 21*  --   --  17*  --  16*  GLUCOSE 150*  148* 107* 174*  --   --  414*  --  164*  BUN 35*  35* 34* 43*  --   --  67*  --  78*  CREATININE 0.93  0.94 0.93 0.82  --   --  1.89*  --  2.55*  CALCIUM 7.8*  7.8* 8.0* 7.6*  --   --  6.5*  --  6.9*  MG 2.2  --  2.0 1.4* 2.0  --  2.2  --   PHOS 2.5  --  4.1 4.1 6.0*  --  5.5*  --    Liver Function Tests: Recent Labs  Lab 07/04/20 0413 07/06/20 0350 07/07/20 0217 07/08/20 1437 07/09/20 0325  AST 32 32  32 38 29 28  ALT 32 35  35 36 31 29  ALKPHOS 80 92  91 96 65 68  BILITOT 0.7 0.9  0.6 0.8 0.8 0.5  PROT 5.1* 4.9*  4.9* 5.1* 4.3* 4.4*  ALBUMIN 2.3* 2.3*  2.2* 2.4* 2.1* 1.9*   No results for input(s): LIPASE, AMYLASE in the  last 168 hours. No results for input(s): AMMONIA in the last 168 hours. CBC: Recent Labs  Lab 07/06/20 0350 07/07/20 0217 07/07/20 1159 07/07/20 2339 07/08/20 0509 07/08/20 1437 07/09/20 0325  WBC 18.2* 22.8* 25.7*  --  22.0*  --  34.8*  NEUTROABS  --   --   --   --  20.1*  --   --   HGB 13.1 13.5 12.7* 11.1* 7.7* 11.4* 11.7*  HCT 41.6 42.3 40.5 36.0* 25.0* 35.7* 36.1*  MCV 83.7 82.9 83.9  --  87.7  --  87.4  PLT 128* 127* 190  --  125*  124*  --  89*   Cardiac Enzymes: No results for input(s): CKTOTAL, CKMB, CKMBINDEX, TROPONINI in the last 168 hours. Sepsis Labs: Recent Labs  Lab 07/06/20 0350 07/06/20 0644 07/06/20 2031 07/07/20 0217 07/07/20 1159 07/08/20 0509 07/09/20 0325  PROCALCITON  --   --  <0.10  --   --   --   --   WBC 18.2*  --   --  22.8* 25.7* 22.0* 34.8*  LATICACIDVEN 1.1 1.3  --   --   --   --   --     Procedures/Operations   OETT 12/20  Left [place] CVL 12/20  Left chest tube 12/20  Pete E Alonnah Lampkins 2020/07/24, 2:08 PM

## 2020-07-19 NOTE — Progress Notes (Signed)
RT Note: Pt. removed from Ventilator/Endotracheal tube removed per order.

## 2020-07-19 DEATH — deceased

## 2022-03-17 IMAGING — DX DG CHEST 1V PORT
1 series · 1 of 1 positions shown · non-contrast
Comparison: [HOSPITAL] Chest CTA 12/09/2016 and earlier.

CLINICAL DATA: 58-year-old male with shortness of breath, positive
LRFIK-ER times 10 days.

EXAM:
PORTABLE CHEST 1 VIEW

[chest ap]
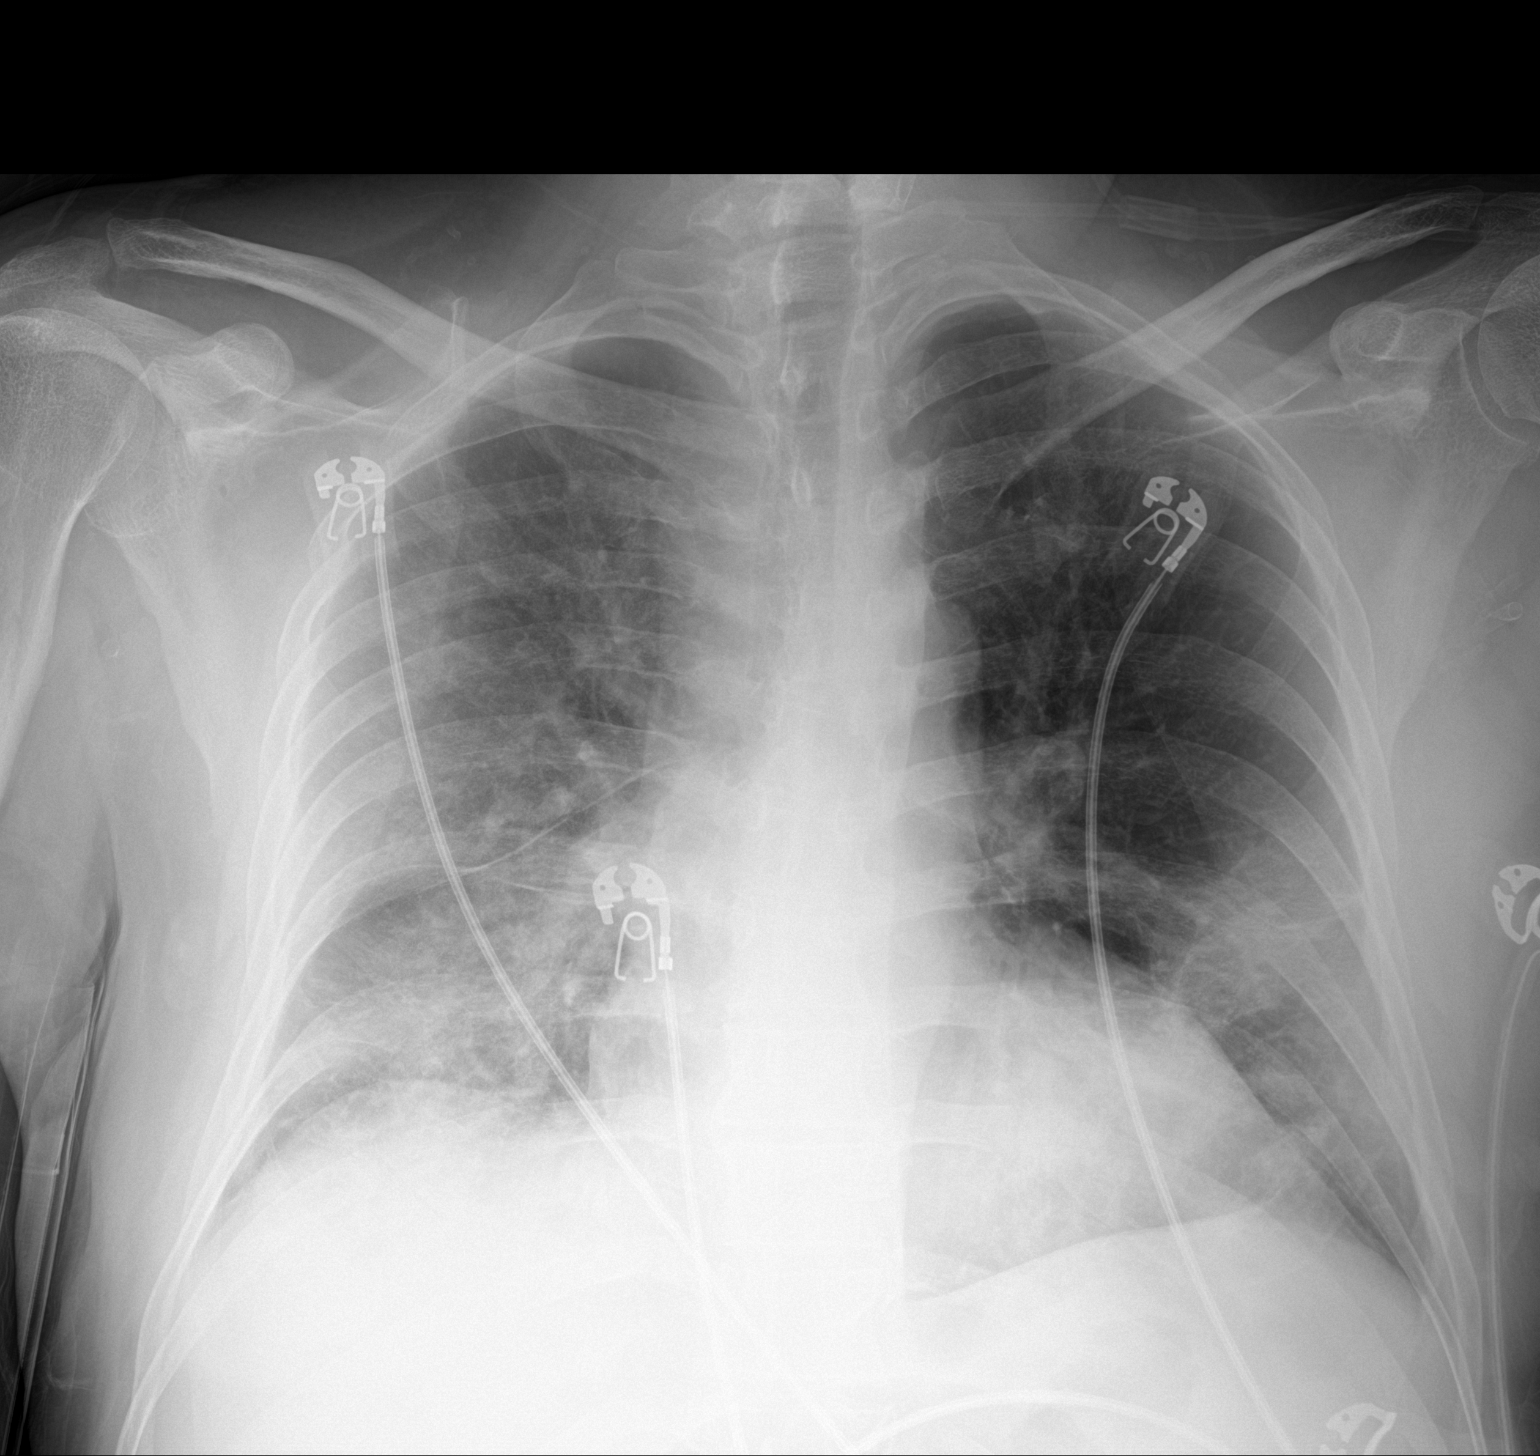

[1 of 1 positions shown; findings below may reference images not displayed]

FINDINGS: Portable AP semi upright view at 4995 hours. Lung volumes and
mediastinal contours appear stable since 8621 and within normal
limits.

There is hazy and indistinct opacity throughout much of the right
lung, most confluent at the right lung base. Similar Patchy and
confluent left lung base opacity. Visualized tracheal air column is
within normal limits. No pneumothorax. No pleural effusion is
evident. No acute osseous abnormality identified.
IMPRESSION: Asymmetric right > left patchy and indistinct pulmonary opacity
compatible with LRFIK-ER pneumonia in this setting.
